# Patient Record
Sex: Female | Born: 1935 | Race: White | Hispanic: No | Smoking: Never smoker
Health system: Southern US, Community
[De-identification: ages and names within clinical notes are randomized; demographics above are authoritative.]

## PROBLEM LIST (undated history)

## (undated) DIAGNOSIS — M5430 Sciatica, unspecified side: Secondary | ICD-10-CM

## (undated) DIAGNOSIS — F0390 Unspecified dementia without behavioral disturbance: Secondary | ICD-10-CM

## (undated) DIAGNOSIS — M1990 Unspecified osteoarthritis, unspecified site: Secondary | ICD-10-CM

## (undated) DIAGNOSIS — I10 Essential (primary) hypertension: Secondary | ICD-10-CM

## (undated) DIAGNOSIS — I2510 Atherosclerotic heart disease of native coronary artery without angina pectoris: Secondary | ICD-10-CM

## (undated) DIAGNOSIS — G8929 Other chronic pain: Secondary | ICD-10-CM

## (undated) DIAGNOSIS — L309 Dermatitis, unspecified: Secondary | ICD-10-CM

## (undated) DIAGNOSIS — E785 Hyperlipidemia, unspecified: Secondary | ICD-10-CM

## (undated) DIAGNOSIS — M549 Dorsalgia, unspecified: Secondary | ICD-10-CM

## (undated) DIAGNOSIS — H269 Unspecified cataract: Secondary | ICD-10-CM

## (undated) HISTORY — PX: LIPOMA EXCISION: SHX5283

## (undated) HISTORY — DX: Essential (primary) hypertension: I10

## (undated) HISTORY — PX: ABDOMINAL HYSTERECTOMY: SHX81

## (undated) HISTORY — DX: Unspecified osteoarthritis, unspecified site: M19.90

## (undated) HISTORY — DX: Hyperlipidemia, unspecified: E78.5

## (undated) HISTORY — PX: FRACTURE SURGERY: SHX138

## (undated) HISTORY — DX: Atherosclerotic heart disease of native coronary artery without angina pectoris: I25.10

## (undated) HISTORY — DX: Dermatitis, unspecified: L30.9

---

## 2006-02-09 ENCOUNTER — Inpatient Hospital Stay (HOSPITAL_COMMUNITY): Admission: EM | Admit: 2006-02-09 | Discharge: 2006-02-09 | Payer: Self-pay | Admitting: Emergency Medicine

## 2006-03-17 ENCOUNTER — Ambulatory Visit: Payer: Self-pay | Admitting: Cardiology

## 2006-03-19 ENCOUNTER — Ambulatory Visit (HOSPITAL_BASED_OUTPATIENT_CLINIC_OR_DEPARTMENT_OTHER): Admission: RE | Admit: 2006-03-19 | Discharge: 2006-03-19 | Payer: Self-pay | Admitting: Orthopedic Surgery

## 2006-04-16 ENCOUNTER — Ambulatory Visit: Payer: Self-pay | Admitting: Cardiology

## 2009-08-21 LAB — HM MAMMOGRAPHY: HM Mammogram: NORMAL

## 2009-12-04 ENCOUNTER — Ambulatory Visit (HOSPITAL_COMMUNITY)
Admission: RE | Admit: 2009-12-04 | Discharge: 2009-12-04 | Payer: Self-pay | Source: Home / Self Care | Admitting: Surgery

## 2009-12-04 ENCOUNTER — Encounter: Admission: RE | Admit: 2009-12-04 | Discharge: 2009-12-04 | Payer: Self-pay | Admitting: Surgery

## 2009-12-26 ENCOUNTER — Ambulatory Visit (HOSPITAL_COMMUNITY)
Admission: RE | Admit: 2009-12-26 | Discharge: 2009-12-26 | Payer: Self-pay | Source: Home / Self Care | Admitting: Surgery

## 2010-04-02 LAB — BASIC METABOLIC PANEL
CO2: 27 mEq/L (ref 19–32)
GFR calc non Af Amer: >60 mL/min (ref >60)
Glucose, Bld: 109 mg/dL — ABNORMAL HIGH (ref 70–99)
Potassium: 4.2 mEq/L (ref 3.5–5.1)
Sodium: 141 mEq/L (ref 135–145)

## 2010-04-02 LAB — CBC
HCT: 41.6 % (ref 36.0–46.0)
Hemoglobin: 13.6 g/dL (ref 12.0–15.0)
MCH: 30.1 pg (ref 26.0–34.0)
MCHC: 32.7 g/dL (ref 30.0–36.0)

## 2010-04-02 LAB — SURGICAL PCR SCREEN: MRSA, PCR: NEGATIVE

## 2010-04-03 LAB — BASIC METABOLIC PANEL
BUN: 19 mg/dL (ref 6–23)
Chloride: 105 mEq/L (ref 96–112)
Potassium: 4.2 mEq/L (ref 3.5–5.1)
Sodium: 139 mEq/L (ref 135–145)

## 2010-05-17 ENCOUNTER — Other Ambulatory Visit: Payer: Self-pay | Admitting: Internal Medicine

## 2010-05-17 ENCOUNTER — Encounter: Payer: Self-pay | Admitting: Internal Medicine

## 2010-05-17 ENCOUNTER — Other Ambulatory Visit (INDEPENDENT_AMBULATORY_CARE_PROVIDER_SITE_OTHER): Payer: Medicare Other

## 2010-05-17 ENCOUNTER — Ambulatory Visit (INDEPENDENT_AMBULATORY_CARE_PROVIDER_SITE_OTHER): Payer: Medicare Other | Admitting: Internal Medicine

## 2010-05-17 VITALS — BP 142/80 | HR 71 | Temp 97.4°F | Resp 16 | Ht 63.0 in | Wt 188.0 lb

## 2010-05-17 DIAGNOSIS — L259 Unspecified contact dermatitis, unspecified cause: Secondary | ICD-10-CM

## 2010-05-17 DIAGNOSIS — R05 Cough: Secondary | ICD-10-CM | POA: Insufficient documentation

## 2010-05-17 DIAGNOSIS — E782 Mixed hyperlipidemia: Secondary | ICD-10-CM

## 2010-05-17 DIAGNOSIS — M839 Adult osteomalacia, unspecified: Secondary | ICD-10-CM

## 2010-05-17 DIAGNOSIS — Z23 Encounter for immunization: Secondary | ICD-10-CM

## 2010-05-17 DIAGNOSIS — IMO0001 Reserved for inherently not codable concepts without codable children: Secondary | ICD-10-CM | POA: Insufficient documentation

## 2010-05-17 DIAGNOSIS — L309 Dermatitis, unspecified: Secondary | ICD-10-CM | POA: Insufficient documentation

## 2010-05-17 DIAGNOSIS — I10 Essential (primary) hypertension: Secondary | ICD-10-CM

## 2010-05-17 DIAGNOSIS — E785 Hyperlipidemia, unspecified: Secondary | ICD-10-CM | POA: Insufficient documentation

## 2010-05-17 LAB — CBC WITH DIFFERENTIAL/PLATELET
Basophils Absolute: 0 10*3/uL (ref 0.0–0.1)
Eosinophils Absolute: 0.1 10*3/uL (ref 0.0–0.7)
Eosinophils Relative: 1 % (ref 0.0–5.0)
MCHC: 33.9 g/dL (ref 30.0–36.0)
MCV: 90.7 fl (ref 78.0–100.0)
Monocytes Absolute: 0.6 10*3/uL (ref 0.1–1.0)
Neutrophils Relative %: 67.2 % (ref 43.0–77.0)
Platelets: 208 10*3/uL (ref 150.0–400.0)
RDW: 13.7 % (ref 11.5–14.6)
WBC: 8.5 10*3/uL (ref 4.5–10.5)

## 2010-05-17 LAB — COMPREHENSIVE METABOLIC PANEL
AST: 22 U/L (ref 0–37)
Albumin: 3.7 g/dL (ref 3.5–5.2)
Alkaline Phosphatase: 69 U/L (ref 39–117)
GFR: 75 mL/min (ref >60.00)
Potassium: 3.9 mEq/L (ref 3.5–5.1)
Sodium: 142 mEq/L (ref 135–145)
Total Bilirubin: 0.6 mg/dL (ref 0.3–1.2)
Total Protein: 6.5 g/dL (ref 6.0–8.3)

## 2010-05-17 LAB — LIPID PANEL
Cholesterol: 245 mg/dL — ABNORMAL HIGH (ref 0–200)
HDL: 54.9 mg/dL (ref >39.00)
Triglycerides: 210 mg/dL — ABNORMAL HIGH (ref 0.0–149.0)

## 2010-05-17 LAB — LDL CHOLESTEROL, DIRECT: Direct LDL: 163.3 mg/dL

## 2010-05-17 LAB — TSH: TSH: 2.85 u[IU]/mL (ref 0.35–5.50)

## 2010-05-17 LAB — CK: Total CK: 113 U/L (ref 7–177)

## 2010-05-17 LAB — URINALYSIS, ROUTINE W REFLEX MICROSCOPIC
Hgb urine dipstick: NEGATIVE
Urine Glucose: NEGATIVE
Urobilinogen, UA: 0.2 (ref 0.0–1.0)

## 2010-05-17 MED ORDER — OLMESARTAN-AMLODIPINE-HCTZ 20-5-12.5 MG PO TABS: 1.0000 | ORAL_TABLET | Freq: Every day | ORAL | Status: DC

## 2010-05-17 MED ORDER — ROSUVASTATIN CALCIUM 5 MG PO TABS
5.0000 mg | ORAL_TABLET | Freq: Every day | ORAL | Status: DC
Start: 2010-05-17 — End: 2010-05-29

## 2010-05-17 NOTE — Patient Instructions (Signed)

## 2010-05-17 NOTE — Assessment & Plan Note (Signed)
Her skin is normal today, she wants to see a derm for f/up on eczema

## 2010-05-17 NOTE — Assessment & Plan Note (Signed)
This is caused by the ACEI so I will stop it, no testing is needed today

## 2010-05-17 NOTE — Assessment & Plan Note (Signed)
Stop simvastatin, change to crestor, check labs today (CPK, CBC, CMP)

## 2010-05-17 NOTE — Assessment & Plan Note (Signed)
Change to crestor, check TSH, FLP, and CMP today

## 2010-05-17 NOTE — Assessment & Plan Note (Signed)
Check her vitamin D level, she DOES NOT want to take a med for her "thin" bones

## 2010-05-17 NOTE — Progress Notes (Signed)
Subjective:    Patient ID: Virginia Edwards, female    DOB: 05-06-1925, 77 y.o.   MRN: [id number]  Cough This is a chronic problem. The current episode started more than 1 year ago. The problem has been unchanged. The problem occurs constantly. The cough is non-productive. Associated symptoms include myalgias. Pertinent negatives include no chest pain, chills, ear congestion, ear pain, eye redness, fever, headaches, heartburn, hemoptysis, nasal congestion, postnasal drip, rash, rhinorrhea, sore throat, shortness of breath, sweats, weight loss or wheezing. The symptoms are aggravated by other. She has tried OTC cough suppressant for the symptoms. The treatment provided no relief. There is no history of asthma, bronchiectasis, bronchitis, COPD, emphysema, environmental allergies or pneumonia.  Hypertension This is a chronic problem. The current episode started more than 1 year ago. The problem has been gradually improving since onset. The problem is controlled. Pertinent negatives include no anxiety, blurred vision, chest pain, headaches, malaise/fatigue, neck pain, orthopnea, palpitations, peripheral edema, PND, shortness of breath or sweats. There are no associated agents to hypertension. Past treatments include ACE inhibitors, calcium channel blockers and diuretics. The current treatment provides significant improvement. Compliance problems include exercise.  There is no history of chronic renal disease.  Hyperlipidemia This is a chronic problem. The current episode started more than 1 year ago. The problem is controlled. Recent lipid tests were reviewed and are variable. Exacerbating diseases include obesity. She has no history of chronic renal disease, diabetes, hypothyroidism, liver disease or nephrotic syndrome. Factors aggravating her hyperlipidemia include no known factors. Associated symptoms include myalgias. Pertinent negatives include no chest pain, focal sensory loss, focal weakness, leg pain or  shortness of breath. Current antihyperlipidemic treatment includes statins. The current treatment provides moderate improvement of lipids. There are no compliance problems.       Review of Systems  Constitutional: Negative for fever, chills, weight loss, malaise/fatigue, diaphoresis, activity change, appetite change, fatigue and unexpected weight change.  HENT: Negative for ear pain, nosebleeds, congestion, sore throat, facial swelling, rhinorrhea, sneezing, neck pain, neck stiffness, voice change and postnasal drip.   Eyes: Negative for blurred vision, pain, redness and itching.  Respiratory: Positive for cough. Negative for apnea, hemoptysis, choking, chest tightness, shortness of breath, wheezing and stridor.   Cardiovascular: Negative for chest pain, palpitations, orthopnea, leg swelling and PND.  Gastrointestinal: Negative for heartburn, nausea, vomiting, abdominal pain, diarrhea, constipation, blood in stool, abdominal distention and anal bleeding.  Genitourinary: Negative for dysuria, urgency, frequency, hematuria, flank pain, decreased urine volume, enuresis, difficulty urinating and dyspareunia.  Musculoskeletal: Positive for myalgias and arthralgias. Negative for back pain, joint swelling and gait problem.  Skin: Negative for color change, pallor and rash.  Neurological: Negative for dizziness, tremors, focal weakness, seizures, syncope, facial asymmetry, speech difficulty, weakness, light-headedness, numbness and headaches.  Hematological: Negative for environmental allergies and adenopathy.  Psychiatric/Behavioral: Negative for hallucinations, behavioral problems, confusion, sleep disturbance, decreased concentration and agitation. The patient is not nervous/anxious and is not hyperactive.        Objective:   Physical Exam  Vitals reviewed. Constitutional: She is oriented to person, place, and time. She appears well-developed and well-nourished. No distress.  HENT:  Head:  Normocephalic and atraumatic.  Right Ear: External ear normal.  Left Ear: External ear normal.  Nose: Nose normal.  Mouth/Throat: Oropharynx is clear and moist. No oropharyngeal exudate.  Eyes: Conjunctivae and EOM are normal. Pupils are equal, round, and reactive to light. Right eye exhibits no discharge. Left eye exhibits no discharge. No scleral icterus.  Neck: Normal range of motion. Neck supple. No JVD present. No tracheal deviation present. No thyromegaly present.  Cardiovascular: Normal rate, regular rhythm, normal heart sounds and intact distal pulses.  Exam reveals no gallop and no friction rub.   No murmur heard. Pulmonary/Chest: Effort normal and breath sounds normal. No stridor. No respiratory distress. She has no wheezes. She has no rales. She exhibits no tenderness.  Abdominal: Soft. Bowel sounds are normal. She exhibits no distension and no mass. There is no tenderness. There is no rebound and no guarding.  Musculoskeletal: Normal range of motion. She exhibits no edema and no tenderness.  Lymphadenopathy:    She has no cervical adenopathy.  Neurological: She is alert and oriented to person, place, and time. She has normal reflexes. No cranial nerve deficit.  Skin: Skin is warm and dry. No rash noted. She is not diaphoretic. No erythema. No pallor.  Psychiatric: She has a normal mood and affect. Her behavior is normal. Judgment and thought content normal.       Lab Results  Component Value Date   WBC 8.2 12/25/2009   HGB 13.6 12/25/2009   HCT 41.6 12/25/2009   PLT 206 12/25/2009   NA 141 12/25/2009   K 4.2 12/25/2009   CL 105 12/25/2009   CREATININE 0.77 12/25/2009   BUN 15 12/25/2009   CO2 27 12/25/2009      Assessment & Plan:

## 2010-05-17 NOTE — Assessment & Plan Note (Signed)
Stop the ACEI due to cough, consolidate her regimen into tribenzor

## 2010-05-20 LAB — VITAMIN D 1,25 DIHYDROXY: Vitamin D2 1, 25 (OH)2: 17 pg/mL

## 2010-05-28 ENCOUNTER — Telehealth: Payer: Self-pay | Admitting: *Deleted

## 2010-05-28 NOTE — Telephone Encounter (Signed)
Pt left vm req a call back.  

## 2010-05-28 NOTE — Telephone Encounter (Signed)
Spoke w/pt -   1. Has OV 06/15/10 - should she keep this?  2. New BP med combo caused her to feel very "bad" - wants to resume what she was on before. Ok for RFs of previous?  3. Crestor caused muscle pain worse in thigh and groin, wants to resume previous zocor, OK?

## 2010-05-29 MED ORDER — SIMVASTATIN 10 MG PO TABS: 10.0000 mg | ORAL_TABLET | Freq: Every day | ORAL | Status: DC

## 2010-05-29 MED ORDER — QUINAPRIL-HYDROCHLOROTHIAZIDE 20-25 MG PO TABS: 1.0000 | ORAL_TABLET | Freq: Every day | ORAL | Status: DC

## 2010-05-29 NOTE — Telephone Encounter (Signed)
1. Yes. Keep appt 2. Yes 3. Yes

## 2010-05-29 NOTE — Telephone Encounter (Signed)
MED list changed & RFs sent in, Need to call pt in the AM

## 2010-05-30 NOTE — Telephone Encounter (Signed)
Left detailed vm for pt.

## 2010-06-08 NOTE — Consult Note (Signed)
Virginia Edwards, Virginia Edwards                ACCOUNT NO.:  [id number]   MEDICAL RECORD NO.:  [id number]          PATIENT TYPE:  AMB   LOCATION:  DSC                          FACILITY:  [facility]   PHYSICIAN:  Madolyn Frieze. Jens Som, MD, FACCDATE OF BIRTH:  1925/02/23   DATE OF CONSULTATION:  03/19/2006  DATE OF DISCHARGE:                                 CONSULTATION   CARDIOLOGY CONSULT   HISTORY:  Virginia Edwards is a pleasant 77 year old female with a past  medical history of hypertension, hyperlipidemia; and now status post  left arthroscopic shoulder surgery who we are asked TO evaluate for  chest pain.  She has no prior cardiac history and denies any history of  dyspnea on exertion, orthopnea, PND, pedal edema, palpitations,  presyncope, syncope, or exertional chest pain.  She presented, today,  for her shoulder surgery.  She had local anesthesia to her left  shoulder.  She then developed chest pain that was described as a  pressure.  The pain did not radiate.  There was no associated nausea,  vomiting, shortness of breath, or diaphoresis.  The pain was not  pleuritic or positional.  It lasted for approximately 30 minutes; and  then she went to surgery.  Following surgery, her symptoms had resolved.  We are now asked to further evaluate.   MEDICATIONS INCLUDE:  1. Guansacine 2 mg p.o. daily  2. Quinaretic.  3  Amlodipine.  1. Zocor.  2. Multivitamin.  3. Tylenol   ALLERGIES:  She has an intolerance to ADVIL.   FAMILY HISTORY:  She did state that she had a brother who died at age  51.  He had a heart problem of unclear description.   PAST MEDICAL HISTORY:  Significant for hypertension and hyperlipidemia,  but there is no diabetes mellitus.  She has had a prior hysterectomy,  and now shoulder surgery; but there is no other surgeries noted.   SOCIAL HISTORY:  She does not smoke nor does she consume alcohol.   REVIEW OF SYSTEMS:  She denies any headaches, fevers, or chills.  There  is no  productive cough or hemoptysis.  There is no dysphagia,  odynphagia, melena, or hematochezia.  There is no dysuria or hematuria.  There is no seizure activity.  There is no orthopnea, PND, or pedal  edema.  The remaining systems are negative.   PHYSICAL EXAMINATION:  GENERAL:  Today, she is well-developed, well-  nourished in no acute distress at present.  SKIN:  Warm and dry.  VITAL SIGNS:  Her blood pressure is 150/80.  Her pulse is 96.  There is  no peripheral clubbing.  She is not appear to be depressed, although  mildly anxious.  HEENT:  Unremarkable normal eyelids.  NECK:  Supple with normal upstroke bilaterally.  I cannot appreciate  bruits.  There is no jugular distension and no thyromegaly is noted.  CHEST:  Clear to auscultation on expansion.  CARDIOVASCULAR: Reveals a regular rate and rhythm.  Normal S1-S2.  There  is a soft 1/6 systolic ejection murmur at the left sternal border.  There is no S3  or S4.  Her PMI is nondisplaced.  ABDOMINAL EXAM:  Nontender, positive bowel sounds.  No  hepatosplenomegaly.  No mass appreciated.  There is no abdominal bruit.  She has 2+ femoral pulses bilaterally.  No bruits.  EXTREMITIES:  Her left shoulder is in a sling following her arthroscopic  surgery.  She has 2+ radial pulses.  Show no edema and no cords.  She  has 1+ dorsalis pedis pulses bilaterally.  NEUROLOGIC EXAM:  Grossly intact.   ELECTROCARDIOGRAM:  Shows sinus rhythm at a rate of 96.  There is subtle  lateral T-wave inversion which is mildly increased compared to a  previous electrocardiogram dated 01/20.   DIAGNOSIS:  1. Atypical chest pain.  2. Hypertension.  3. Hyperlipidemia.  4. Status post arthroscopic shoulder surgery.   PLAN:  [name] developed substernal chest pain prior to her  procedure.  Her symptoms are somewhat atypical.  Her electrocardiogram  shows minor increased T-wave inversion in the lateral leads.  I have  recommended admission with rule out  myocardial infarction with serial  enzymes; however, she has declined.  I have discussed the risks of  undiagnosed coronary disease; including myocardial infarction and death.  She continues to decline and would prefer to be discharged home.  I  would continue with the present medications, but would add an aspirin  (enteric coated aspirin 81 mg p.o. daily).  I will have her call the  office and arrange an appointment see me and 1-2 weeks.  We will most  likely perform a Myoview in the future for risk stratification.      Madolyn Frieze Jens Som, MD, [facility]  Electronically Signed     BSC/MEDQ  D:  03/19/2006  T:  03/19/2006  Job:  [id number]

## 2010-06-08 NOTE — Op Note (Signed)
NAME:  Virginia Edwards, Virginia Edwards                ACCOUNT NO.:  [id number]   MEDICAL RECORD NO.:  [id number]          PATIENT TYPE:  AMB   LOCATION:  DSC                          FACILITY:  [facility]   PHYSICIAN:  Dyke Brackett, M.D.    DATE OF BIRTH:  1925/08/26   DATE OF PROCEDURE:  03/19/2006  DATE OF DISCHARGE:                               OPERATIVE REPORT   PREOPERATIVE DIAGNOSES:  1. Large complete tear, infraspinatus, supraspinatus, rotator cuff      tears.  2. Shearing-type injury with denuding of glenoid cartilage, anterior      inferior 20% of glenoid.  3. Degenerative tearing of anterior superior labrum.  4. Degenerative tearing of biceps tendon.   POSTOPERATIVE DIAGNOSES:  1. Large complete tear, infraspinatus, supraspinatus, rotator cuff      tears.  2. Shearing-type injury with denuding of glenoid cartilage, anterior      inferior 20% of glenoid.  3. Degenerative tearing of anterior superior labrum.  4. Degenerative tearing of biceps tendon.   OPERATION:  1. Open rotator cuff repair with acromioplasty.  2. Open distal clavicle excision.  3. Arthroscopic debridement of glenohumeral joint.   SURGEON:  Dyke Brackett, M.D.   ASSISTANT:  Legrand Pitts. Duffy, P.A.   INDICATIONS:  This 77 year old sustained a subglenoid dislocation that I  reduced under anesthesia.  Difficulty with continued use of the arm led  to an MRI, showing a large rotator cuff tear, thought to be amenable to  outpatient versus overnight hospitalization.   DESCRIPTION:  She was arthroscoped through a posterolateral and anterior  portal.  She did have some very early degenerative change on the humeral  head; however, with the dislocation, probably the anterior inferior 20%  of the glenoid cartilage was denuded with loose pieces that were  debrided.  Extensive tearing of the labrum, mainly degenerative in  nature, was debrided, as well as a degenerative biceps tendon, which was  probably not functioning well, but  intact, probably 50% involvement  with the tearing.  There was a large cuff tear that did appear to be  repairable though.  The procedure was converted to an open procedure  with incision in the deltoid, the acromial AC interval.  Very arthritic,  enlarged AC joint was resected, and a very hypertrophic, curved acromion  as well for an acromioplasty, revealing a very large cuff tear.  Bursa  was excised.  Cuff tear edges were resected.  It was a very large tear.  It was not retracted significantly, although the tissue was thin and  attenuated.  A bur was placed on the tuberosity to freshen it to a  bleeding edge, followed by placement of 3 Arthrex anchors, 5.5, each  with 2 sutures, for a total of 6 sutures on the repair of the cuff.  An  excellent repair, given the severity of the initial appearance.  The  closure was effected with interrupted Tycron on the deltoid, 0 and 2-0  Vicryl on the subcutaneous tissues, and the arthroscopy portals were  steri-stripped.  Lightly compressive sterile dressing and a shoulder  abduction pillow applied.  Taken to the recovery room in stable  condition.      Dyke Brackett, M.D.  Electronically Signed     WDC/MEDQ  D:  03/19/2006  T:  03/19/2006  Job:  [id number]

## 2010-06-08 NOTE — Op Note (Signed)
NAME:  Virginia Edwards, Virginia Edwards NO.:  [id number]   MEDICAL RECORD NO.:  [id number]          PATIENT TYPE:  INP   LOCATION:  0102                         [facility]ACILITY[facility]  [facility]   PHYSICIAN:  Dyke Brackett, M.D.    DATE OF BIRTH:  1925-06-10   DATE OF PROCEDURE:  02/09/2006  DATE OF DISCHARGE:  02/09/2006                               OPERATIVE REPORT   INDICATIONS:  This is a  77 year old female with a subglenoid anterior  dislocation of the shoulder, unable to reduce in the emergency room by  the Emergency Room physician, thought to be amenable to general  anesthetic, closed reduction.   PREOPERATIVE DIAGNOSIS:  Anterior subglenoid dislocation right shoulder.   POSTOPERATIVE DIAGNOSIS:  Anterior subglenoid dislocation right  shoulder.   OPERATION:  Closed reduction right shoulder dislocation.   SURGEON:  Caffrey.   ASSISTANT:  Leanne Chang.   General.   DESCRIPTION OF PROCEDURE:  After general anesthetic was administered, x-  ray confirmed the subglenoid dislocation with abduction and forward  flexion and external rotation the shoulder reduced without difficulty,  though initially it did seem to redislocate with minimal manipulation.  However, with the arm at the side rotation was possible internally and  externally without any tendency to redislocate.  C-arm fluoroscopy  confirmed a good reduction.  Pulses were intact pre and post reduction.  A sling, shoulder mobilizer applied.  Taken to the recovery room in  stable condition.      Dyke Brackett, M.D.  Electronically Signed     WDC/MEDQ  D:  02/09/2006  T:  02/09/2006  Job:  [id number]

## 2010-06-08 NOTE — Assessment & Plan Note (Signed)
[facility]                            CARDIOLOGY OFFICE NOTE   TRINITEY, ROACHE                         MRN:          [id number]  DATE:04/16/2006                            DOB:          February 13, 1925    SUBJECTIVE:  Virginia Edwards returns for followup today.  She is a 77-year-  old female with a past medical history of hypertension and  hyperlipidemia whom I recently evaluated following left arthroscopic  shoulder surgery.  After having local anesthesia to her left shoulder at  that time, she developed chest pain that was described as a pressure.  There was subtle lateral T-wave inversion.  We advised admission to rule  out a myocardial infarction, but she declined.  She otherwise has done  well following her surgery.  There is no dyspnea, chest pain,  palpitations or syncope.  She is continuing to rehab her right shoulder.   MEDICATIONS:  1. Guaifenesin/quinaretic 20/25 mg q.d.  2. Amlodipine 5 mg p.o. q.d.  3. Zocor 40 mg p.o. q.d.  4. Multivitamin.  5. Aspirin.   PHYSICAL EXAMINATION:  VITAL SIGNS:  Blood pressure 156/82, pulse 102,  weight 191 pounds.  NECK:  Supple with no bruits.  CHEST:  Clear.  CARDIOVASCULAR:  A regular rate and rhythm.  EXTREMITIES:  Shows a trace of edema.   Her electrocardiogram shows a sinus tachycardia at a rate of 102.  There  are no significant ST changes.  The lateral T-wave inversion that was  present on March 19, 2006, has improved.   DIAGNOSIS:  1. Recent atypical chest pain.  2. Status post arthroscopic shoulder surgery.  3. Hypertension.  4. Hyperlipidemia.   PLAN:  Ms. Spies has had no further chest pain and no shortness of  breath.  Her symptoms were atypical when we evaluated her  postoperatively, but she did have multiple risk factors.  I have offered  a Myoview for risk stratification today, but she has declined.  She  understands the risk of undiagnosed coronary artery disease.  I have  asked  her to follow up with Dr. Janey Greaser concerning her primary care  issues, including her lipids and hypertension.   She will see [place] back on an as-needed basis.     Virginia Frieze Jens Som, MD, [facility]  Electronically Signed    BSC/MedQ  DD: 04/16/2006  DT: 04/16/2006  Job #: [id number]   cc:   Al Decant. Janey Greaser, MD

## 2010-06-12 ENCOUNTER — Other Ambulatory Visit: Payer: Self-pay | Admitting: Internal Medicine

## 2010-06-15 ENCOUNTER — Ambulatory Visit: Payer: Medicare Other | Admitting: Internal Medicine

## 2010-07-05 ENCOUNTER — Other Ambulatory Visit: Payer: Self-pay | Admitting: Internal Medicine

## 2010-07-05 ENCOUNTER — Ambulatory Visit (INDEPENDENT_AMBULATORY_CARE_PROVIDER_SITE_OTHER): Payer: Medicare Other | Admitting: Internal Medicine

## 2010-07-05 ENCOUNTER — Encounter: Payer: Self-pay | Admitting: Internal Medicine

## 2010-07-05 ENCOUNTER — Other Ambulatory Visit (INDEPENDENT_AMBULATORY_CARE_PROVIDER_SITE_OTHER): Payer: Medicare Other

## 2010-07-05 VITALS — BP 140/80 | HR 70 | Temp 97.4°F | Resp 16 | Wt 187.0 lb

## 2010-07-05 DIAGNOSIS — I10 Essential (primary) hypertension: Secondary | ICD-10-CM

## 2010-07-05 DIAGNOSIS — M839 Adult osteomalacia, unspecified: Secondary | ICD-10-CM

## 2010-07-05 DIAGNOSIS — E782 Mixed hyperlipidemia: Secondary | ICD-10-CM

## 2010-07-05 DIAGNOSIS — M1710 Unilateral primary osteoarthritis, unspecified knee: Secondary | ICD-10-CM

## 2010-07-05 LAB — CBC WITH DIFFERENTIAL/PLATELET
Basophils Relative: 0.4 % (ref 0.0–3.0)
Eosinophils Relative: 0.8 % (ref 0.0–5.0)
HCT: 39.2 % (ref 36.0–46.0)
Hemoglobin: 13.3 g/dL (ref 12.0–15.0)
Lymphs Abs: 2.3 10*3/uL (ref 0.7–4.0)
MCV: 91 fl (ref 78.0–100.0)
Monocytes Absolute: 0.7 10*3/uL (ref 0.1–1.0)
Monocytes Relative: 6.7 % (ref 3.0–12.0)
Neutro Abs: 6.8 10*3/uL (ref 1.4–7.7)
Platelets: 191 10*3/uL (ref 150.0–400.0)
WBC: 9.8 10*3/uL (ref 4.5–10.5)

## 2010-07-05 LAB — COMPREHENSIVE METABOLIC PANEL
Alkaline Phosphatase: 68 U/L (ref 39–117)
BUN: 23 mg/dL (ref 6–23)
CO2: 29 mEq/L (ref 19–32)
Creatinine, Ser: 0.8 mg/dL (ref 0.4–1.2)
GFR: 72.82 mL/min (ref >60.00)
Glucose, Bld: 93 mg/dL (ref 70–99)
Sodium: 141 mEq/L (ref 135–145)
Total Bilirubin: 0.9 mg/dL (ref 0.3–1.2)
Total Protein: 6.8 g/dL (ref 6.0–8.3)

## 2010-07-05 LAB — LIPID PANEL
Cholesterol: 280 mg/dL — ABNORMAL HIGH (ref 0–200)
HDL: 57.9 mg/dL (ref >39.00)
Triglycerides: 214 mg/dL — ABNORMAL HIGH (ref 0.0–149.0)
VLDL: 42.8 mg/dL — ABNORMAL HIGH (ref 0.0–40.0)

## 2010-07-05 LAB — TSH: TSH: 3.37 u[IU]/mL (ref 0.35–5.50)

## 2010-07-05 MED ORDER — HYDROCHLOROTHIAZIDE 12.5 MG PO CAPS: 12.5000 mg | ORAL_CAPSULE | Freq: Every day | ORAL | Status: DC

## 2010-07-05 MED ORDER — GUANFACINE HCL 2 MG PO TABS: 2.0000 mg | ORAL_TABLET | Freq: Every day | ORAL | Status: DC

## 2010-07-05 MED ORDER — SIMVASTATIN 10 MG PO TABS: 10.0000 mg | ORAL_TABLET | Freq: Every day | ORAL | Status: DC

## 2010-07-05 NOTE — Patient Instructions (Addendum)
Hypertension (High Blood Pressure) As your heart beats, it forces blood through your arteries. This force is your blood pressure. If the pressure is too high, it is called hypertension (HTN) or high blood pressure. HTN is dangerous because you may have it and not know it. High blood pressure may mean that your heart has to work harder to pump blood. Your arteries may be narrow or stiff. The extra work puts you at risk for heart disease, stroke, and other problems.  Blood pressure consists of two numbers, a higher number over a lower, 110/72, for example. It is stated as "110 over 72." The ideal is below 120 for the top number (systolic) and under 80 for the bottom (diastolic). Write down your blood pressure today. You should pay close attention to your blood pressure if you have certain conditions such as:  Heart failure.  Prior heart attack.   Diabetes   Chronic kidney disease.   Prior stroke.   Multiple risk factors for heart disease.   To see if you have HTN, your blood pressure should be measured while you are seated with your arm held at the level of the heart. It should be measured at least twice. A one-time elevated blood pressure reading (especially in the Emergency Department) does not mean that you need treatment. There may be conditions in which the blood pressure is different between your right and left arms. It is important to see your caregiver soon for a recheck. Most people have essential hypertension which means that there is not a specific cause. This type of high blood pressure may be lowered by changing lifestyle factors such as:  Stress.  Smoking.   Lack of exercise.   Excessive weight.  Drug/tobacco/alcohol use.   Eating less salt.   Most people do not have symptoms from high blood pressure until it has caused damage to the body. Effective treatment can often prevent, delay or reduce that damage. TREATMENT Treatment for high blood pressure, when a cause has been  identified, is directed at the cause. There are a large number of medications to treat HTN. These fall into several categories, and your caregiver will help you select the medicines that are best for you. Medications may have side effects. You should review side effects with your caregiver. If your blood pressure stays high after you have made lifestyle changes or started on medicines,   Your medication(s) may need to be changed.   Other problems may need to be addressed.   Be certain you understand your prescriptions, and know how and when to take your medicine.   Be sure to follow up with your caregiver within the time frame advised (usually within two weeks) to have your blood pressure rechecked and to review your medications.   If you are taking more than one medicine to lower your blood pressure, make sure you know how and at what times they should be taken. Taking two medicines at the same time can result in blood pressure that is too low.  SEEK IMMEDIATE MEDICAL CARE IF YOU DEVELOP:  A severe headache, blurred or changing vision, or confusion.   Unusual weakness or numbness, or a faint feeling.   Severe chest or abdominal pain, vomiting, or breathing problems.  MAKE SURE YOU:   Understand these instructions.   Will watch your condition.   Will get help right away if you are not doing well or get worse.  Document Released: 01/07/2005 Document Re-Released: 06/27/2009 [employer] Patient Information 2011 [employer].Arthritis - Degenerative, Osteoarthritis You have osteoarthritis. This is the wear and tear arthritis that comes with aging. It is also called degenerative arthritis. This is common in people past middle age. It is caused by stress on the joints from living. The large weight bearing joints of the lower extremities are most often affected. The knees, hips, back, neck, and hands can become painful, swollen, and stiff. This is the most common type of arthritis. It comes on  with age, carrying too much weight, and from injury. Treatment includes resting the sore joint until the pain and swelling improve. Crutches or a walker may be needed for severe flares. Only take over-the-counter or prescription medicines for pain, discomfort, or fever as directed by your caregiver. Local heat therapy may improve motion. Cortisone shots into the joint are sometimes used to reduce pain and swelling during flares. Osteoarthritis is usually not crippling and progresses slowly. There are things you can do to decrease pain:  Avoid high impact activities.   Exercise regularly.   Low impact exercises such as walking, biking and swimming help to keep the muscles strong and keep normal joint function.   Stretching helps to keep your range of motion.   Lose weight if you are overweight. This reduces joint stress.  In severe cases when you have pain at rest or increasing disability, joint surgery may be helpful. See your caregiver for follow-up treatment as recommended.  SEEK IMMEDIATE MEDICAL CARE IF:  You have severe joint pain.   Marked swelling and redness in your joint develops.   You develop a high fever.  Document Released: 01/07/2005 Document Re-Released: 05/09/2007 [facility] Patient Information 2011 [place], [place].Hypertriglyceridemia  Diet for High blood levels of Triglycerides Most fats in food are triglycerides. Triglycerides in your blood are stored as fat in your body. High levels of triglycerides in your blood may put you at a greater risk for heart disease and stroke.  Normal triglyceride levels are less than 150 mg/dL. Borderline high levels are 150-199 mg/dl. High levels are 200 - 499 mg/dL, and very high triglyceride levels are greater than 500 mg/dL. The decision to treat high triglycerides is generally based on the level. For people with borderline or high triglyceride levels, treatment includes weight loss and exercise. Drugs are recommended for people with very  high triglyceride levels. Many people who need treatment for high triglyceride levels have metabolic syndrome. This syndrome is a collection of disorders that often include: insulin resistance, high blood pressure, blood clotting problems, high cholesterol and triglycerides. TESTING PROCEDURE FOR TRIGLYCERIDES  You should not eat 4 hours before getting your triglycerides measured. The normal range of triglycerides is between 10 and 250 milligrams per deciliter (mg/dl). Some people may have extreme levels (1000 or above), but your triglyceride level may be too high if it is above 150 mg/dl, depending on what other risk factors you have for heart disease.   People with high blood triglycerides may also have high blood cholesterol levels. If you have high blood cholesterol as well as high blood triglycerides, your risk for heart disease is probably greater than if you only had high triglycerides. High blood cholesterol is one of the main risk factors for heart disease.  CHANGING YOUR DIET  Your weight can affect your blood triglyceride level. If you are more than 20% above your ideal body weight, you may be able to lower your blood triglycerides by losing weight. Eating less and exercising regularly is the best way to combat this. Fat  provides more calories than any other food. The best way to lose weight is to eat less fat. Only 30% of your total calories should come from fat. Less than 7% of your diet should come from saturated fat. A diet low in fat and saturated fat is the same as a diet to decrease blood cholesterol. By eating a diet lower in fat, you may lose weight, lower your blood cholesterol, and lower your blood triglyceride level.  Eating a diet low in fat, especially saturated fat, may also help you lower your blood triglyceride level. Ask your dietitian to help you figure how much fat you can eat based on the number of calories your caregiver has prescribed for you.  Exercise, in addition to  helping with weight loss may also help lower triglyceride levels.   Alcohol can increase blood triglycerides. You may need to stop drinking alcoholic beverages.   Too much carbohydrate in your diet may also increase your blood triglycerides. Some complex carbohydrates are necessary in your diet. These may include bread, rice, potatoes, other starchy vegetables and cereals.   Reduce "simple" carbohydrates. These may include pure sugars, candy, honey, and jelly without losing other nutrients. If you have the kind of high blood triglycerides that is affected by the amount of carbohydrates in your diet, you will need to eat less sugar and less high-sugar foods. Your caregiver can help you with this.   Adding 2-4 grams of fish oil (EPA+ DHA) may also help lower triglycerides. Speak with your caregiver before adding any supplements to your regimen.  Following the Diet  Maintain your ideal weight. Your caregivers can help you with a diet. Generally, eating less food and getting more exercise will help you lose weight. Joining a weight control group may also help. Ask your caregivers for a good weight control group in your area.  Eat low-fat foods instead of high-fat foods. This can help you lose weight too.  These foods are lower in fat. Eat MORE of these:   Dried beans, peas, and lentils.   Egg whites.   Low-fat cottage cheese.   Fish.   Lean cuts of meat, such as round, sirloin, rump, and flank (cut extra fat off meat you fix).   Whole grain breads, cereals and pasta.   Skim and nonfat dry milk.   Low-fat yogurt.   Poultry without the skin.   Cheese made with skim or part-skim milk, such as mozzarella, parmesan, farmers', ricotta, or pot cheese.   These are higher fat foods. Eat LESS of these:   Whole milk and foods made from whole milk, such as American, blue, cheddar, monterey jack, and swiss cheese   High-fat meats, such as luncheon meats, sausages, knockwurst, bratwurst, hot dogs,  ribs, corned beef, ground pork, and regular ground beef.   Fried foods.  Limit saturated fats in your diet. Substituting unsaturated fat for saturated fat may decrease your blood triglyceride level. You will need to read package labels to know which products contain saturated fats.  These foods are high in saturated fat. Eat LESS of these:   Fried pork skins.  Whole milk.   Skin and fat from poultry.   Palm oil.   Butter.   Shortening.   Cream cheese.   Tomasa Blase.   Margarines and baked goods made from listed oils.   Vegetable shortenings.   Chitterlings.  Fat from meats.   Coconut oil.   Palm kernel oil.   Lard.   Cream.   Sour  cream.   Fatback.   Coffee whiteners and non-dairy creamers made with these oils.   Cheese made from whole milk.   Use unsaturated fats (both polyunsaturated and monounsaturated) moderately. Remember, even though unsaturated fats are better than saturated fats; you still want a diet low in total fat.  These foods are high in unsaturated fat:   Canola oil.  Sunflower oil.   Mayonnaise.   Almonds.   Peanuts.   Pine nuts.   Margarines made with these oils.   Safflower oil.  Olive oil.   Avocados.   Cashews.   Peanut butter.   Sunflower seeds.   Soybean oil.  Peanut oil.   Olives.   Pecans.   Walnuts.   Pumpkin seeds.   Avoid sugar and other high-sugar foods. This will decrease carbohydrates without decreasing other nutrients. Sugar in your food goes rapidly to your blood. When there is excess sugar in your blood, your liver may use it to make more triglycerides. Sugar also contains calories without other important nutrients.  Eat LESS of these:   Sugar, brown sugar, powdered sugar, jam, jelly, preserves, honey, syrup, molasses, pies, candy, cakes, cookies, frosting, pastries, colas, soft drinks, punches, fruit drinks, and regular gelatin.   Avoid alcohol. Alcohol, even more than sugar, may increase blood  triglycerides. In addition, alcohol is high in calories and low in nutrients. Ask for sparkling water, or a diet soft drink instead of an alcoholic beverage.  Suggestions for planning and preparing meals   Bake, broil, grill or roast meats instead of frying.   Remove fat from meats and skin from poultry before cooking.   Add spices, herbs, lemon juice or vinegar to vegetables instead of salt, rich sauces or gravies.   Use a non-stick skillet without fat or use no-stick sprays.   Cool and refrigerate stews and broth. Then remove the hardened fat floating on the surface before serving.   Refrigerate meat drippings and skim off fat to make low-fat gravies.   Serve more fish.   Use less butter, margarine and other high-fat spreads on bread or vegetables.   Use skim or reconstituted non-fat dry milk for cooking.   Cook with low-fat cheeses.   Substitute low-fat yogurt or cottage cheese for all or part of the sour cream in recipes for sauces, dips or congealed salads.   Use half yogurt/half mayonnaise in salad recipes.   Substitute evaporated skim milk for cream. Evaporated skim milk or reconstituted non-fat dry milk can be whipped and substituted for whipped cream in certain recipes.   Choose fresh fruits for dessert instead of high-fat foods such as pies or cakes. Fruits are naturally low in fat.  When Dining Out   Order low-fat appetizers such as fruit or vegetable juice, pasta with vegetables or tomato sauce.   Select clear, rather than cream soups.   Ask that dressings and gravies be served on the side. Then use less of them.   Order foods that are baked, broiled, poached, steamed, stir-fried, or roasted.   Ask for margarine instead of butter, and use only a small amount.   Drink sparkling water, unsweetened tea or coffee, or diet soft drinks instead of alcohol or other sweet beverages.  QUESTIONS AND ANSWERS ABOUT OTHER FATS IN THE BLOOD:  SATURATED FAT, TRANS FAT, AND  CHOLESTEROL What is trans fat? Trans fat is a type of fat that is formed when vegetable oil is hardened through a process called hydrogenation. This process helps makes foods more  solid, gives them shape, and prolongs their shelf life. Trans fats are also called hydrogenated or partially hydrogenated oils.  What do saturated fat, trans fat, and cholesterol in foods have to do with heart disease? Saturated fat, trans fat, and cholesterol in the diet all raise the level of LDL "bad" cholesterol in the blood. The higher the LDL cholesterol, the greater the risk for coronary heart disease (CHD). Saturated fat and trans fat raise LDL similarly.  What foods contain saturated fat, trans fat, and cholesterol? High amounts of saturated fat are found in animal products, such as fatty cuts of meat, chicken skin, and full-fat dairy products like butter, whole milk, cream, and cheese, and in tropical vegetable oils such as palm, palm kernel, and coconut oil. Trans fat is found in some of the same foods as saturated fat, such as vegetable shortening, some margarines (especially hard or stick margarine), crackers, cookies, baked goods, fried foods, salad dressings, and other processed foods made with partially hydrogenated vegetable oils. Small amounts of trans fat also occur naturally in some animal products, such as milk products, beef, and lamb. Foods high in cholesterol include liver, other organ meats, egg yolks, shrimp, and full-fat dairy products. How can I use the new food label to make heart-healthy food choices? Check the Nutrition Facts panel of the food label. Choose foods lower in saturated fat, trans fat, and cholesterol. For saturated fat and cholesterol, you can also use the Percent Daily Value (%DV): 5% DV or less is low, and 20% DV or more is high. (There is no %DV for trans fat.) Use the Nutrition Facts panel to choose foods low in saturated fat and cholesterol, and if the trans fat is not listed, read  the ingredients and limit products that list shortening or hydrogenated or partially hydrogenated vegetable oil, which tend to be high in trans fat. POINTS TO REMEMBER: YOU NEED A LITTLE TLC (THERAPEUTIC LIFESTYLE CHANGES)  Discuss your risk for heart disease with your caregivers, and take steps to reduce risk factors.   Change your diet. Choose foods that are low in saturated fat, trans fat, and cholesterol.   Add exercise to your daily routine if it is not already being done. Participate in physical activity of moderate intensity, like brisk walking, for at least 30 minutes on most, and preferably all days of the week. No time? Break the 30 minutes into three, 10-minute segments during the day.   Stop smoking. If you do smoke, contact your caregiver to discuss ways in which they can help you quit.   Do not use street drugs.   Maintain a normal weight.   Maintain a healthy blood pressure.   Keep up with your blood work for checking the fats in your blood as directed by your caregiver.  Document Released: 10/26/2003 Document Re-Released: 06/27/2009 [facility] Patient Information 2011 [place], [place].

## 2010-07-05 NOTE — Assessment & Plan Note (Signed)
I have asked her to stop the ACEI due to the cough and will continue the other three meds for now, today I will check her lytes and renal function

## 2010-07-05 NOTE — Assessment & Plan Note (Signed)
She is doing well on Simvastatin, today I will check her FLP, CMP, and TSH

## 2010-07-05 NOTE — Assessment & Plan Note (Signed)
Check her Vit D level today 

## 2010-07-05 NOTE — Assessment & Plan Note (Signed)
Continue Euflexxa and f/up with Dr. Madelon Lips

## 2010-07-05 NOTE — Progress Notes (Signed)
Subjective:    Patient ID: Virginia Edwards, female    DOB: Jul 15, 1925, 77 y.o.   MRN: [id number]  Hypertension This is a chronic problem. The current episode started more than 1 year ago. The problem has been gradually improving since onset. The problem is controlled. Pertinent negatives include no anxiety, blurred vision, chest pain, headaches, malaise/fatigue, neck pain, orthopnea, palpitations, peripheral edema, PND, shortness of breath or sweats. There are no associated agents to hypertension. Past treatments include calcium channel blockers, ACE inhibitors, diuretics and central alpha agonists. The current treatment provides moderate improvement. Compliance problems include medication side effects.  There is no history of chronic renal disease.  Hyperlipidemia This is a chronic problem. The current episode started more than 1 year ago. The problem is controlled. Recent lipid tests were reviewed and are variable. Exacerbating diseases include obesity. She has no history of chronic renal disease, diabetes, hypothyroidism, liver disease or nephrotic syndrome. Factors aggravating her hyperlipidemia include no known factors. Pertinent negatives include no chest pain, focal sensory loss, focal weakness, leg pain, myalgias or shortness of breath. Current antihyperlipidemic treatment includes statins. The current treatment provides moderate improvement of lipids. Compliance problems include adherence to exercise and adherence to diet.       Review of Systems  Constitutional: Negative for fever, chills, malaise/fatigue, diaphoresis, activity change, appetite change, fatigue and unexpected weight change.  HENT: Negative for nosebleeds, congestion, sore throat, facial swelling, rhinorrhea, sneezing, drooling, mouth sores, trouble swallowing, neck pain, dental problem, voice change, postnasal drip, sinus pressure and tinnitus.   Eyes: Negative.  Negative for blurred vision.  Respiratory: Positive for cough  (chronic, persistent, intermittent non-productive cough due to the ACEI). Negative for apnea, choking, chest tightness, shortness of breath, wheezing and stridor.   Cardiovascular: Negative for chest pain, palpitations, orthopnea, leg swelling and PND.  Gastrointestinal: Negative for nausea, vomiting, abdominal pain, diarrhea, constipation and blood in stool.  Genitourinary: Negative.   Musculoskeletal: Positive for arthralgias (pain in both knees, she sees Dr. Christian Mate and is getting Euflexxa inj in both knees). Negative for myalgias, back pain, joint swelling and gait problem.  Skin: Negative for color change, pallor and rash.  Neurological: Negative for dizziness, tremors, focal weakness, seizures, syncope, facial asymmetry, speech difficulty, weakness, light-headedness, numbness and headaches.  Hematological: Negative for adenopathy. Does not bruise/bleed easily.  Psychiatric/Behavioral: Negative.        Objective:   Physical Exam  Vitals reviewed. Constitutional: She is oriented to person, place, and time. She appears well-developed and well-nourished. No distress.  HENT:  Head: Normocephalic and atraumatic.  Right Ear: External ear normal.  Left Ear: External ear normal.  Nose: Nose normal.  Mouth/Throat: Oropharynx is clear and moist. No oropharyngeal exudate.  Eyes: Conjunctivae and EOM are normal. Pupils are equal, round, and reactive to light. Right eye exhibits no discharge. Left eye exhibits no discharge. No scleral icterus.  Neck: Normal range of motion. Neck supple. No JVD present. No tracheal deviation present. No thyromegaly present.  Cardiovascular: Normal rate, regular rhythm, normal heart sounds and intact distal pulses.  Exam reveals no gallop and no friction rub.   No murmur heard. Pulmonary/Chest: Effort normal and breath sounds normal. No stridor. No respiratory distress. She has no wheezes. She has no rales. She exhibits no tenderness.  Abdominal: Soft. Bowel sounds  are normal. She exhibits no distension and no mass. There is no tenderness. There is no rebound and no guarding.  Musculoskeletal: Normal range of motion. She exhibits no edema and no  tenderness.  Lymphadenopathy:    She has no cervical adenopathy.  Neurological: She is alert and oriented to person, place, and time. She has normal reflexes. She displays normal reflexes. No cranial nerve deficit. She exhibits normal muscle tone. Coordination normal.  Skin: Skin is warm and dry. No rash noted. She is not diaphoretic. No erythema. No pallor.  Psychiatric: She has a normal mood and affect. Her behavior is normal. Judgment and thought content normal.        Lab Results  Component Value Date   WBC 8.5 05/17/2010   HGB 13.4 05/17/2010   HCT 39.7 05/17/2010   PLT 208.0 05/17/2010   CHOL 245* 05/17/2010   TRIG 210.0* 05/17/2010   HDL 54.90 05/17/2010   LDLDIRECT 163.3 05/17/2010   ALT 17 05/17/2010   AST 22 05/17/2010   NA 142 05/17/2010   K 3.9 05/17/2010   CL 105 05/17/2010   CREATININE 0.8 05/17/2010   BUN 21 05/17/2010   CO2 28 05/17/2010   TSH 2.85 05/17/2010    Assessment & Plan:

## 2010-07-07 LAB — VITAMIN D 1,25 DIHYDROXY: Vitamin D3 1, 25 (OH)2: 24 pg/mL

## 2010-07-30 ENCOUNTER — Telehealth: Payer: Self-pay | Admitting: *Deleted

## 2010-07-30 NOTE — Telephone Encounter (Signed)
Cholesterol was high but otherwise labs look great

## 2010-07-30 NOTE — Telephone Encounter (Signed)
Patient informed, mailed copy of labs to patient.

## 2010-07-30 NOTE — Telephone Encounter (Signed)
Patient requesting results of last labs.

## 2010-08-16 ENCOUNTER — Ambulatory Visit (INDEPENDENT_AMBULATORY_CARE_PROVIDER_SITE_OTHER): Payer: Medicare Other | Admitting: Internal Medicine

## 2010-08-16 ENCOUNTER — Encounter: Payer: Self-pay | Admitting: Internal Medicine

## 2010-08-16 VITALS — BP 136/82 | HR 71 | Temp 98.2°F | Resp 16 | Wt 186.0 lb

## 2010-08-16 DIAGNOSIS — M839 Adult osteomalacia, unspecified: Secondary | ICD-10-CM

## 2010-08-16 DIAGNOSIS — I10 Essential (primary) hypertension: Secondary | ICD-10-CM

## 2010-08-16 DIAGNOSIS — E782 Mixed hyperlipidemia: Secondary | ICD-10-CM

## 2010-08-16 DIAGNOSIS — Z1231 Encounter for screening mammogram for malignant neoplasm of breast: Secondary | ICD-10-CM

## 2010-08-16 MED ORDER — NIACIN ER (ANTIHYPERLIPIDEMIC) 500 MG PO TBCR: 500.0000 mg | EXTENDED_RELEASE_TABLET | Freq: Every day | ORAL | Status: DC

## 2010-08-16 MED ORDER — PITAVASTATIN CALCIUM 2 MG PO TABS: 1.0000 | ORAL_TABLET | Freq: Every day | ORAL | Status: DC

## 2010-08-16 NOTE — Progress Notes (Signed)
Addended by: Etta Grandchild on: 08/16/2010 01:09 PM   Modules accepted: Orders

## 2010-08-16 NOTE — Patient Instructions (Signed)
Hypercholesterolemia High Blood Cholesterol Cholesterol is a white, waxy, fat-like protein needed by your body in small amounts. The liver makes all the cholesterol you need. It is carried from the liver by the blood through the blood vessels. Deposits (plaque) may build up on blood vessel walls. This makes the arteries narrower and stiffer. Plaque increases the risk for heart attack and stroke. You cannot feel your cholesterol level even if it is very high. The only way to know is by a blood test to check your lipid (fats) levels. Once you know your cholesterol levels, you should keep a record of the test results. Work with your caregiver to to keep your levels in the desired range. WHAT THE RESULTS MEAN:  Total cholesterol is a rough measure of all the cholesterol in your blood.   LDL is the so-called bad cholesterol. This is the type that deposits cholesterol in the walls of the arteries. You want this level to be low.   HDL is the good cholesterol because it cleans the arteries and carries the LDL away. You want this level to be high.   Triglycerides are fat that the body can either burn for energy or store. High levels are closely linked to heart disease.  DESIRED LEVELS:  Total cholesterol below 200.   LDL below 100 for people at risk, below 70 for very high risk.   HDL above 50 is good, above 60 is best.   Triglycerides below 150.  HOW TO LOWER YOUR CHOLESTEROL:  Diet.   Choose fish or white meat chicken and [place], roasted or baked. Limit fatty cuts of red meat, fried foods, and processed meats, such as sausage and lunch meat.   Eat lots of fresh fruits and vegetables. Choose whole grains, beans, pasta, potatoes and cereals.   Use only small amounts of olive, corn or canola oils. Avoid butter, mayonnaise, shortening or palm kernel oils. Avoid foods with trans-fats.   Use skim/nonfat milk and low-fat/nonfat yogurt and cheeses. Avoid whole milk, cream, ice cream, egg yolks and  cheeses. Healthy desserts include angel food cake, gingersnaps, animal crackers, hard candy, popsicles, and low-fat/nonfat frozen yogurt. Avoid pastries, cakes, pies and cookies.   Exercise.   A regular program helps decrease LDL and raises HDL.   Helps with weight control.   Do things that increase your activity level like gardening, walking, or taking the stairs.   Medication.   May be prescribed by your caregiver to help lowering cholesterol and the risk for heart disease.   You may need medicine even if your levels are normal if you have several risk factors.  HOME CARE INSTRUCTIONS  Follow your diet and exercise programs as suggested by your caregiver.   Take medications as directed.   Have blood work done when your caregiver feels it is necessary.  MAKE SURE YOU:   Understand these instructions.   Will watch your condition.   Will get help right away if you are not doing well or get worse.  Document Released: 01/07/2005 Document Re-Released: 12/21/2007 [facility] Patient Information 2011 [place], [place].Hypertriglyceridemia  Diet for High blood levels of Triglycerides Most fats in food are triglycerides. Triglycerides in your blood are stored as fat in your body. High levels of triglycerides in your blood may put you at a greater risk for heart disease and stroke.  Normal triglyceride levels are less than 150 mg/dL. Borderline high levels are 150-199 mg/dl. High levels are 200 - 499 mg/dL, and very high triglyceride levels are  greater than 500 mg/dL. The decision to treat high triglycerides is generally based on the level. For people with borderline or high triglyceride levels, treatment includes weight loss and exercise. Drugs are recommended for people with very high triglyceride levels. Many people who need treatment for high triglyceride levels have metabolic syndrome. This syndrome is a collection of disorders that often include: insulin resistance, high blood pressure,  blood clotting problems, high cholesterol and triglycerides. TESTING PROCEDURE FOR TRIGLYCERIDES  You should not eat 4 hours before getting your triglycerides measured. The normal range of triglycerides is between 10 and 250 milligrams per deciliter (mg/dl). Some people may have extreme levels (1000 or above), but your triglyceride level may be too high if it is above 150 mg/dl, depending on what other risk factors you have for heart disease.   People with high blood triglycerides may also have high blood cholesterol levels. If you have high blood cholesterol as well as high blood triglycerides, your risk for heart disease is probably greater than if you only had high triglycerides. High blood cholesterol is one of the main risk factors for heart disease.  CHANGING YOUR DIET  Your weight can affect your blood triglyceride level. If you are more than 20% above your ideal body weight, you may be able to lower your blood triglycerides by losing weight. Eating less and exercising regularly is the best way to combat this. Fat provides more calories than any other food. The best way to lose weight is to eat less fat. Only 30% of your total calories should come from fat. Less than 7% of your diet should come from saturated fat. A diet low in fat and saturated fat is the same as a diet to decrease blood cholesterol. By eating a diet lower in fat, you may lose weight, lower your blood cholesterol, and lower your blood triglyceride level.  Eating a diet low in fat, especially saturated fat, may also help you lower your blood triglyceride level. Ask your dietitian to help you figure how much fat you can eat based on the number of calories your caregiver has prescribed for you.  Exercise, in addition to helping with weight loss may also help lower triglyceride levels.   Alcohol can increase blood triglycerides. You may need to stop drinking alcoholic beverages.   Too much carbohydrate in your diet may also increase  your blood triglycerides. Some complex carbohydrates are necessary in your diet. These may include bread, rice, potatoes, other starchy vegetables and cereals.   Reduce "simple" carbohydrates. These may include pure sugars, candy, honey, and jelly without losing other nutrients. If you have the kind of high blood triglycerides that is affected by the amount of carbohydrates in your diet, you will need to eat less sugar and less high-sugar foods. Your caregiver can help you with this.   Adding 2-4 grams of fish oil (EPA+ DHA) may also help lower triglycerides. Speak with your caregiver before adding any supplements to your regimen.  Following the Diet  Maintain your ideal weight. Your caregivers can help you with a diet. Generally, eating less food and getting more exercise will help you lose weight. Joining a weight control group may also help. Ask your caregivers for a good weight control group in your area.  Eat low-fat foods instead of high-fat foods. This can help you lose weight too.  These foods are lower in fat. Eat MORE of these:   Dried beans, peas, and lentils.   Egg whites.   Low-fat  cottage cheese.   Fish.   Lean cuts of meat, such as round, sirloin, rump, and flank (cut extra fat off meat you fix).   Whole grain breads, cereals and pasta.   Skim and nonfat dry milk.   Low-fat yogurt.   Poultry without the skin.   Cheese made with skim or part-skim milk, such as mozzarella, parmesan, farmers', ricotta, or pot cheese.   These are higher fat foods. Eat LESS of these:   Whole milk and foods made from whole milk, such as American, blue, cheddar, monterey jack, and swiss cheese   High-fat meats, such as luncheon meats, sausages, knockwurst, bratwurst, hot dogs, ribs, corned beef, ground pork, and regular ground beef.   Fried foods.  Limit saturated fats in your diet. Substituting unsaturated fat for saturated fat may decrease your blood triglyceride level. You will need to  read package labels to know which products contain saturated fats.  These foods are high in saturated fat. Eat LESS of these:   Fried pork skins.  Whole milk.   Skin and fat from poultry.   Palm oil.   Butter.   Shortening.   Cream cheese.   Tomasa Blase.   Margarines and baked goods made from listed oils.   Vegetable shortenings.   Chitterlings.  Fat from meats.   Coconut oil.   Palm kernel oil.   Lard.   Cream.   Sour cream.   Fatback.   Coffee whiteners and non-dairy creamers made with these oils.   Cheese made from whole milk.   Use unsaturated fats (both polyunsaturated and monounsaturated) moderately. Remember, even though unsaturated fats are better than saturated fats; you still want a diet low in total fat.  These foods are high in unsaturated fat:   Canola oil.  Sunflower oil.   Mayonnaise.   Almonds.   Peanuts.   Pine nuts.   Margarines made with these oils.   Safflower oil.  Olive oil.   Avocados.   Cashews.   Peanut butter.   Sunflower seeds.   Soybean oil.  Peanut oil.   Olives.   Pecans.   Walnuts.   Pumpkin seeds.   Avoid sugar and other high-sugar foods. This will decrease carbohydrates without decreasing other nutrients. Sugar in your food goes rapidly to your blood. When there is excess sugar in your blood, your liver may use it to make more triglycerides. Sugar also contains calories without other important nutrients.  Eat LESS of these:   Sugar, brown sugar, powdered sugar, jam, jelly, preserves, honey, syrup, molasses, pies, candy, cakes, cookies, frosting, pastries, colas, soft drinks, punches, fruit drinks, and regular gelatin.   Avoid alcohol. Alcohol, even more than sugar, may increase blood triglycerides. In addition, alcohol is high in calories and low in nutrients. Ask for sparkling water, or a diet soft drink instead of an alcoholic beverage.  Suggestions for planning and preparing meals   Bake, broil, grill  or roast meats instead of frying.   Remove fat from meats and skin from poultry before cooking.   Add spices, herbs, lemon juice or vinegar to vegetables instead of salt, rich sauces or gravies.   Use a non-stick skillet without fat or use no-stick sprays.   Cool and refrigerate stews and broth. Then remove the hardened fat floating on the surface before serving.   Refrigerate meat drippings and skim off fat to make low-fat gravies.   Serve more fish.   Use less butter, margarine and other high-fat spreads on bread  or vegetables.   Use skim or reconstituted non-fat dry milk for cooking.   Cook with low-fat cheeses.   Substitute low-fat yogurt or cottage cheese for all or part of the sour cream in recipes for sauces, dips or congealed salads.   Use half yogurt/half mayonnaise in salad recipes.   Substitute evaporated skim milk for cream. Evaporated skim milk or reconstituted non-fat dry milk can be whipped and substituted for whipped cream in certain recipes.   Choose fresh fruits for dessert instead of high-fat foods such as pies or cakes. Fruits are naturally low in fat.  When Dining Out   Order low-fat appetizers such as fruit or vegetable juice, pasta with vegetables or tomato sauce.   Select clear, rather than cream soups.   Ask that dressings and gravies be served on the side. Then use less of them.   Order foods that are baked, broiled, poached, steamed, stir-fried, or roasted.   Ask for margarine instead of butter, and use only a small amount.   Drink sparkling water, unsweetened tea or coffee, or diet soft drinks instead of alcohol or other sweet beverages.  QUESTIONS AND ANSWERS ABOUT OTHER FATS IN THE BLOOD:  SATURATED FAT, TRANS FAT, AND CHOLESTEROL What is trans fat? Trans fat is a type of fat that is formed when vegetable oil is hardened through a process called hydrogenation. This process helps makes foods more solid, gives them shape, and prolongs their shelf  life. Trans fats are also called hydrogenated or partially hydrogenated oils.  What do saturated fat, trans fat, and cholesterol in foods have to do with heart disease? Saturated fat, trans fat, and cholesterol in the diet all raise the level of LDL "bad" cholesterol in the blood. The higher the LDL cholesterol, the greater the risk for coronary heart disease (CHD). Saturated fat and trans fat raise LDL similarly.  What foods contain saturated fat, trans fat, and cholesterol? High amounts of saturated fat are found in animal products, such as fatty cuts of meat, chicken skin, and full-fat dairy products like butter, whole milk, cream, and cheese, and in tropical vegetable oils such as palm, palm kernel, and coconut oil. Trans fat is found in some of the same foods as saturated fat, such as vegetable shortening, some margarines (especially hard or stick margarine), crackers, cookies, baked goods, fried foods, salad dressings, and other processed foods made with partially hydrogenated vegetable oils. Small amounts of trans fat also occur naturally in some animal products, such as milk products, beef, and lamb. Foods high in cholesterol include liver, other organ meats, egg yolks, shrimp, and full-fat dairy products. How can I use the new food label to make heart-healthy food choices? Check the Nutrition Facts panel of the food label. Choose foods lower in saturated fat, trans fat, and cholesterol. For saturated fat and cholesterol, you can also use the Percent Daily Value (%DV): 5% DV or less is low, and 20% DV or more is high. (There is no %DV for trans fat.) Use the Nutrition Facts panel to choose foods low in saturated fat and cholesterol, and if the trans fat is not listed, read the ingredients and limit products that list shortening or hydrogenated or partially hydrogenated vegetable oil, which tend to be high in trans fat. POINTS TO REMEMBER: YOU NEED A LITTLE TLC (THERAPEUTIC LIFESTYLE  CHANGES)  Discuss your risk for heart disease with your caregivers, and take steps to reduce risk factors.   Change your diet. Choose foods that are low  in saturated fat, trans fat, and cholesterol.   Add exercise to your daily routine if it is not already being done. Participate in physical activity of moderate intensity, like brisk walking, for at least 30 minutes on most, and preferably all days of the week. No time? Break the 30 minutes into three, 10-minute segments during the day.   Stop smoking. If you do smoke, contact your caregiver to discuss ways in which they can help you quit.   Do not use street drugs.   Maintain a normal weight.   Maintain a healthy blood pressure.   Keep up with your blood work for checking the fats in your blood as directed by your caregiver.  Document Released: 10/26/2003 Document Re-Released: 06/27/2009 [facility] Patient Information 2011 [place], [place].

## 2010-08-16 NOTE — Assessment & Plan Note (Signed)
Her BP is well controlled 

## 2010-08-16 NOTE — Assessment & Plan Note (Signed)
Start livalo and niaspan

## 2010-08-16 NOTE — Progress Notes (Signed)
Subjective:    Patient ID: Virginia Edwards, female    DOB: Dec 18, 1925, 77 y.o.   MRN: [id number]  Hyperlipidemia This is a chronic problem. The current episode started more than 1 year ago. The problem is uncontrolled. Recent lipid tests were reviewed and are high. Exacerbating diseases include obesity. She has no history of chronic renal disease, diabetes, hypothyroidism, liver disease or nephrotic syndrome. Factors aggravating her hyperlipidemia include no known factors. Pertinent negatives include no chest pain, focal sensory loss, focal weakness, leg pain, myalgias or shortness of breath. Current antihyperlipidemic treatment includes statins. The current treatment provides no improvement of lipids. Compliance problems include adherence to exercise and adherence to diet.       Review of Systems  Constitutional: Negative for fever, chills, diaphoresis, activity change, appetite change, fatigue and unexpected weight change.  HENT: Negative.   Eyes: Negative.   Respiratory: Negative for apnea, cough, choking, chest tightness, shortness of breath, wheezing and stridor.   Cardiovascular: Negative for chest pain, palpitations and leg swelling.  Gastrointestinal: Negative for nausea, vomiting, abdominal pain, diarrhea, constipation and blood in stool.  Genitourinary: Negative for dysuria, urgency, frequency, hematuria, flank pain, decreased urine volume, enuresis, difficulty urinating and dyspareunia.  Musculoskeletal: Positive for arthralgias (bilateral knee pain). Negative for myalgias, back pain, joint swelling and gait problem.  Skin: Negative for color change, pallor, rash and wound.  Neurological: Negative for dizziness, tremors, focal weakness, seizures, syncope, facial asymmetry, speech difficulty, weakness, light-headedness, numbness and headaches.  Hematological: Negative for adenopathy. Does not bruise/bleed easily.  Psychiatric/Behavioral: Negative.        Objective:   Physical Exam    Vitals reviewed. Constitutional: She is oriented to person, place, and time. She appears well-developed and well-nourished. No distress.  HENT:  Head: Normocephalic and atraumatic.  Right Ear: External ear normal.  Left Ear: External ear normal.  Nose: Nose normal.  Mouth/Throat: Oropharynx is clear and moist. No oropharyngeal exudate.  Eyes: Conjunctivae and EOM are normal. Pupils are equal, round, and reactive to light. Right eye exhibits no discharge. Left eye exhibits no discharge. No scleral icterus.  Neck: Normal range of motion. Neck supple. No JVD present. No tracheal deviation present. No thyromegaly present.  Cardiovascular: Normal rate, regular rhythm, normal heart sounds and intact distal pulses.  Exam reveals no gallop and no friction rub.   No murmur heard. Pulmonary/Chest: Effort normal and breath sounds normal. No stridor. No respiratory distress. She has no wheezes. She has no rales. She exhibits no tenderness.  Abdominal: Soft. Bowel sounds are normal. She exhibits no distension and no mass. There is no tenderness. There is no rebound and no guarding.  Musculoskeletal: Normal range of motion. She exhibits no edema and no tenderness.  Lymphadenopathy:    She has no cervical adenopathy.  Neurological: She is alert and oriented to person, place, and time. She has normal reflexes. She displays normal reflexes. No cranial nerve deficit. She exhibits normal muscle tone. Coordination normal.  Skin: Skin is warm and dry. No rash noted. She is not diaphoretic. No erythema. No pallor.  Psychiatric: She has a normal mood and affect. Her behavior is normal. Judgment and thought content normal.       Lab Results  Component Value Date   WBC 9.8 07/05/2010   HGB 13.3 07/05/2010   HCT 39.2 07/05/2010   PLT 191.0 07/05/2010   CHOL 280* 07/05/2010   TRIG 214.0* 07/05/2010   HDL 57.90 07/05/2010   LDLDIRECT 201.5 07/05/2010   ALT 19  07/05/2010   AST 22 07/05/2010   NA 141 07/05/2010   K 4.0  07/05/2010   CL 105 07/05/2010   CREATININE 0.8 07/05/2010   BUN 23 07/05/2010   CO2 29 07/05/2010   TSH 3.37 07/05/2010     Assessment & Plan:

## 2010-08-16 NOTE — Assessment & Plan Note (Signed)
Her recent vitamin D levels look great

## 2010-08-21 ENCOUNTER — Telehealth: Payer: Self-pay | Admitting: *Deleted

## 2010-08-21 NOTE — Telephone Encounter (Signed)
Yes she can? You want patient to take niaspan? Will symptoms decrease after time?

## 2010-08-21 NOTE — Telephone Encounter (Signed)
Yes she can.

## 2010-08-21 NOTE — Telephone Encounter (Signed)
Pt left vm - wants MD to know she can not take niaspan. C/o weakness & felt flushed when taking. No problems with Livalo.

## 2010-08-22 ENCOUNTER — Telehealth: Payer: Self-pay | Admitting: *Deleted

## 2010-08-22 DIAGNOSIS — E782 Mixed hyperlipidemia: Secondary | ICD-10-CM

## 2010-08-22 MED ORDER — ATORVASTATIN CALCIUM 40 MG PO TABS: 40.0000 mg | ORAL_TABLET | Freq: Every day | ORAL | Status: DC

## 2010-08-22 NOTE — Telephone Encounter (Signed)
Yes she can stop niacin, yes the symptoms will abate

## 2010-08-22 NOTE — Telephone Encounter (Signed)
done

## 2010-08-22 NOTE — Telephone Encounter (Signed)
Pt c/o muscle aches - she is stopping livalo and would like RX for lipitor. She has had lipitor in the past w/no problems.

## 2010-08-23 NOTE — Telephone Encounter (Signed)
Left vm for pt to check w/pharm 

## 2010-09-21 ENCOUNTER — Ambulatory Visit: Payer: Medicare Other | Admitting: Internal Medicine

## 2010-09-24 ENCOUNTER — Other Ambulatory Visit: Payer: Self-pay | Admitting: Internal Medicine

## 2010-09-27 ENCOUNTER — Ambulatory Visit: Payer: Medicare Other | Admitting: Internal Medicine

## 2010-10-04 ENCOUNTER — Ambulatory Visit: Payer: Medicare Other

## 2010-10-16 ENCOUNTER — Encounter: Payer: Self-pay | Admitting: Internal Medicine

## 2010-11-23 ENCOUNTER — Encounter: Payer: Self-pay | Admitting: Internal Medicine

## 2010-11-23 ENCOUNTER — Ambulatory Visit (INDEPENDENT_AMBULATORY_CARE_PROVIDER_SITE_OTHER): Payer: Medicare Other | Admitting: Internal Medicine

## 2010-11-23 ENCOUNTER — Other Ambulatory Visit (INDEPENDENT_AMBULATORY_CARE_PROVIDER_SITE_OTHER): Payer: Medicare Other

## 2010-11-23 VITALS — BP 118/66 | HR 74 | Temp 98.6°F | Resp 16 | Wt 183.5 lb

## 2010-11-23 DIAGNOSIS — E782 Mixed hyperlipidemia: Secondary | ICD-10-CM

## 2010-11-23 DIAGNOSIS — I10 Essential (primary) hypertension: Secondary | ICD-10-CM

## 2010-11-23 DIAGNOSIS — M1710 Unilateral primary osteoarthritis, unspecified knee: Secondary | ICD-10-CM

## 2010-11-23 DIAGNOSIS — Z23 Encounter for immunization: Secondary | ICD-10-CM

## 2010-11-23 LAB — COMPREHENSIVE METABOLIC PANEL
Albumin: 3.8 g/dL (ref 3.5–5.2)
CO2: 26 mEq/L (ref 19–32)
Calcium: 9.4 mg/dL (ref 8.4–10.5)
Chloride: 104 mEq/L (ref 96–112)
GFR: 63.5 mL/min (ref >60.00)
Glucose, Bld: 105 mg/dL — ABNORMAL HIGH (ref 70–99)
Sodium: 138 mEq/L (ref 135–145)
Total Bilirubin: 1 mg/dL (ref 0.3–1.2)
Total Protein: 7 g/dL (ref 6.0–8.3)

## 2010-11-23 LAB — TSH: TSH: 2.13 u[IU]/mL (ref 0.35–5.50)

## 2010-11-23 LAB — LIPID PANEL
Cholesterol: 181 mg/dL (ref 0–200)
HDL: 58.3 mg/dL (ref >39.00)
Triglycerides: 98 mg/dL (ref 0.0–149.0)
VLDL: 19.6 mg/dL (ref 0.0–40.0)

## 2010-11-23 MED ORDER — DICLOFENAC SODIUM 1 % TD GEL: 1.0000 "application " | Freq: Four times a day (QID) | TRANSDERMAL | Status: DC

## 2010-11-23 NOTE — Patient Instructions (Signed)

## 2010-11-23 NOTE — Assessment & Plan Note (Signed)
Her BP is well controlled, I will check her lytes and renal function today 

## 2010-11-23 NOTE — Assessment & Plan Note (Signed)
She will try voltaren gel

## 2010-11-23 NOTE — Progress Notes (Signed)
Subjective:    Patient ID: Virginia Edwards, female    DOB: [birth year], 77 y.o.   MRN: [id number]  Hypertension This is a chronic problem. The current episode started more than 1 year ago. The problem has been gradually improving since onset. The problem is controlled. Pertinent negatives include no anxiety, blurred vision, chest pain, headaches, malaise/fatigue, neck pain, orthopnea, palpitations, peripheral edema, PND, shortness of breath or sweats. There are no associated agents to hypertension. Past treatments include diuretics, central alpha agonists and ACE inhibitors. The current treatment provides moderate improvement. Compliance problems include exercise and diet.       Review of Systems  Constitutional: Negative for fever, chills, malaise/fatigue, diaphoresis, activity change, appetite change, fatigue and unexpected weight change.  HENT: Negative for sore throat, facial swelling, trouble swallowing, neck pain and voice change.   Eyes: Negative.  Negative for blurred vision.  Respiratory: Negative for apnea, cough, choking, chest tightness, shortness of breath, wheezing and stridor.   Cardiovascular: Negative for chest pain, palpitations, orthopnea and PND.  Gastrointestinal: Negative for nausea, vomiting, diarrhea, constipation and abdominal distention.  Genitourinary: Negative for dysuria, urgency, frequency, hematuria, flank pain, decreased urine volume, enuresis, difficulty urinating and dyspareunia.  Musculoskeletal: Positive for arthralgias. Negative for myalgias, back pain, joint swelling and gait problem.  Skin: Negative for color change, pallor, rash and wound.  Neurological: Negative for dizziness, tremors, seizures, syncope, facial asymmetry, speech difficulty, weakness, light-headedness, numbness and headaches.  Hematological: Negative for adenopathy. Does not bruise/bleed easily.  Psychiatric/Behavioral: Negative.        Objective:   Physical Exam  Vitals  reviewed. Constitutional: She is oriented to person, place, and time. She appears well-developed and well-nourished. No distress.  HENT:  Head: Normocephalic and atraumatic.  Mouth/Throat: Oropharynx is clear and moist. No oropharyngeal exudate.  Eyes: Conjunctivae are normal. Right eye exhibits no discharge. Left eye exhibits no discharge. No scleral icterus.  Neck: Normal range of motion. Neck supple. No JVD present. No tracheal deviation present. No thyromegaly present.  Cardiovascular: Normal rate, regular rhythm, normal heart sounds and intact distal pulses.  Exam reveals no gallop and no friction rub.   No murmur heard. Pulmonary/Chest: Effort normal and breath sounds normal. No stridor. No respiratory distress. She has no wheezes. She has no rales. She exhibits no tenderness.  Abdominal: Soft. Bowel sounds are normal. She exhibits no distension and no mass. There is no tenderness. There is no rebound and no guarding.  Musculoskeletal: She exhibits no edema and no tenderness.       Right knee: She exhibits decreased range of motion and deformity (severe DJD changes). She exhibits no swelling, no effusion, no ecchymosis and normal alignment. no tenderness found.       Left knee: She exhibits decreased range of motion and deformity (moderate DJD changes). She exhibits no swelling, no effusion and no ecchymosis. no tenderness found.  Lymphadenopathy:    She has no cervical adenopathy.  Neurological: She is oriented to person, place, and time. She displays normal reflexes. She exhibits normal muscle tone.  Skin: Skin is warm and dry. No rash noted. She is not diaphoretic. No erythema. No pallor.  Psychiatric: She has a normal mood and affect. Her behavior is normal. Judgment and thought content normal.      Lab Results  Component Value Date   WBC 9.8 07/05/2010   HGB 13.3 07/05/2010   HCT 39.2 07/05/2010   PLT 191.0 07/05/2010   GLUCOSE 93 07/05/2010   CHOL 280* 07/05/2010  TRIG 214.0*  07/05/2010   HDL 57.90 07/05/2010   LDLDIRECT 201.5 07/05/2010   ALT 19 07/05/2010   AST 22 07/05/2010   NA 141 07/05/2010   K 4.0 07/05/2010   CL 105 07/05/2010   CREATININE 0.8 07/05/2010   BUN 23 07/05/2010   CO2 29 07/05/2010   TSH 3.37 07/05/2010      Assessment & Plan:

## 2010-11-23 NOTE — Assessment & Plan Note (Signed)
I will check her FLP to see if she has good control of her trigs and LDL

## 2010-12-04 ENCOUNTER — Telehealth: Payer: Self-pay | Admitting: *Deleted

## 2010-12-04 DIAGNOSIS — M1710 Unilateral primary osteoarthritis, unspecified knee: Secondary | ICD-10-CM

## 2010-12-04 NOTE — Telephone Encounter (Signed)
Pt would like to D/C Voltaren Gel and begin taking RX Naproxen [she states as discussed with you].

## 2010-12-05 MED ORDER — NAPROXEN 375 MG PO TABS: 375.0000 mg | ORAL_TABLET | Freq: Two times a day (BID) | ORAL | Status: DC

## 2010-12-05 NOTE — Telephone Encounter (Signed)
done

## 2010-12-05 NOTE — Telephone Encounter (Signed)
LMOM to inform patient that request has been completed.

## 2010-12-11 ENCOUNTER — Telehealth: Payer: Self-pay | Admitting: Internal Medicine

## 2010-12-11 NOTE — Telephone Encounter (Signed)
Patient called inquiring about her lab results. Please advise.

## 2010-12-12 NOTE — Telephone Encounter (Signed)
Potassium level was slightly low and blood sugar was slightly high

## 2010-12-12 NOTE — Telephone Encounter (Signed)
Informed patient of her lab results.

## 2010-12-26 ENCOUNTER — Other Ambulatory Visit: Payer: Self-pay | Admitting: Internal Medicine

## 2011-02-22 ENCOUNTER — Ambulatory Visit (INDEPENDENT_AMBULATORY_CARE_PROVIDER_SITE_OTHER): Payer: Medicare Other | Admitting: Internal Medicine

## 2011-02-22 ENCOUNTER — Other Ambulatory Visit (INDEPENDENT_AMBULATORY_CARE_PROVIDER_SITE_OTHER): Payer: Medicare Other

## 2011-02-22 ENCOUNTER — Encounter: Payer: Self-pay | Admitting: Internal Medicine

## 2011-02-22 DIAGNOSIS — E782 Mixed hyperlipidemia: Secondary | ICD-10-CM | POA: Diagnosis not present

## 2011-02-22 DIAGNOSIS — I10 Essential (primary) hypertension: Secondary | ICD-10-CM | POA: Diagnosis not present

## 2011-02-22 DIAGNOSIS — R7309 Other abnormal glucose: Secondary | ICD-10-CM

## 2011-02-22 DIAGNOSIS — M1710 Unilateral primary osteoarthritis, unspecified knee: Secondary | ICD-10-CM

## 2011-02-22 DIAGNOSIS — E876 Hypokalemia: Secondary | ICD-10-CM

## 2011-02-22 DIAGNOSIS — R739 Hyperglycemia, unspecified: Secondary | ICD-10-CM | POA: Insufficient documentation

## 2011-02-22 LAB — BASIC METABOLIC PANEL
Calcium: 9.7 mg/dL (ref 8.4–10.5)
Chloride: 103 mEq/L (ref 96–112)
Creatinine, Ser: 0.8 mg/dL (ref 0.4–1.2)
GFR: 75.98 mL/min (ref >60.00)

## 2011-02-22 LAB — MAGNESIUM: Magnesium: 2 mg/dL (ref 1.5–2.5)

## 2011-02-22 LAB — HEMOGLOBIN A1C: Hgb A1c MFr Bld: 6.2 % (ref 4.6–6.5)

## 2011-02-22 MED ORDER — OLMESARTAN-AMLODIPINE-HCTZ 40-5-12.5 MG PO TABS: 1.0000 | ORAL_TABLET | Freq: Every day | ORAL | Status: DC

## 2011-02-22 NOTE — Assessment & Plan Note (Signed)
Stop tenex at her request, change to tribenzor for better BP control

## 2011-02-22 NOTE — Patient Instructions (Signed)

## 2011-02-22 NOTE — Assessment & Plan Note (Signed)
Check K+ and Mg++ levels today and start K+ replacement if needed

## 2011-02-22 NOTE — Assessment & Plan Note (Signed)
Continue aleve as needed.  °

## 2011-02-22 NOTE — Progress Notes (Signed)
  Subjective:    Patient ID: Virginia Edwards, female    DOB: July 24, 1925, 78 y.o.   MRN: [id number]  Hypertension This is a chronic problem. The current episode started more than 1 year ago. The problem is unchanged. The problem is uncontrolled. Pertinent negatives include no anxiety, blurred vision, chest pain, headaches, malaise/fatigue, neck pain, orthopnea, palpitations, peripheral edema, PND, shortness of breath or sweats. There are no associated agents to hypertension. Past treatments include central alpha agonists, calcium channel blockers, ACE inhibitors and diuretics. The current treatment provides moderate improvement. Compliance problems include exercise and diet (she asks to stop tenex because her insurance will not cover it anymore).       Review of Systems  Constitutional: Positive for fatigue. Negative for fever, chills, malaise/fatigue, diaphoresis, activity change, appetite change and unexpected weight change.  HENT: Negative.  Negative for neck pain.   Eyes: Negative.  Negative for blurred vision.  Respiratory: Negative for cough, choking, chest tightness, shortness of breath, wheezing and stridor.   Cardiovascular: Negative for chest pain, palpitations, orthopnea, leg swelling and PND.  Gastrointestinal: Negative.   Genitourinary: Negative.   Musculoskeletal: Positive for arthralgias (knees). Negative for myalgias, back pain, joint swelling and gait problem.  Skin: Negative.   Neurological: Negative for dizziness, tremors, seizures, syncope, facial asymmetry, speech difficulty, weakness, light-headedness, numbness and headaches.  Hematological: Negative for adenopathy. Does not bruise/bleed easily.  Psychiatric/Behavioral: Negative.        Objective:   Physical Exam  Vitals reviewed. Constitutional: She is oriented to person, place, and time. She appears well-developed and well-nourished. No distress.  HENT:  Head: Normocephalic and atraumatic.  Mouth/Throat: Oropharynx is  clear and moist. No oropharyngeal exudate.  Eyes: Conjunctivae are normal. Right eye exhibits no discharge. Left eye exhibits no discharge. No scleral icterus.  Neck: Normal range of motion. Neck supple. No JVD present. No tracheal deviation present. No thyromegaly present.  Cardiovascular: Normal rate, regular rhythm, normal heart sounds and intact distal pulses.  Exam reveals no gallop and no friction rub.   No murmur heard. Pulmonary/Chest: Effort normal and breath sounds normal. No stridor. No respiratory distress. She has no wheezes. She has no rales. She exhibits no tenderness.  Abdominal: Soft. Bowel sounds are normal. She exhibits no distension. There is no tenderness. There is no rebound and no guarding.  Musculoskeletal: Normal range of motion. She exhibits edema (trace edema in both legs). She exhibits no tenderness.  Lymphadenopathy:    She has no cervical adenopathy.  Neurological: She is oriented to person, place, and time.  Skin: Skin is warm and dry. No rash noted. She is not diaphoretic. No erythema. No pallor.  Psychiatric: She has a normal mood and affect. Her behavior is normal. Judgment and thought content normal.      Lab Results  Component Value Date   WBC 9.8 07/05/2010   HGB 13.3 07/05/2010   HCT 39.2 07/05/2010   PLT 191.0 07/05/2010   GLUCOSE 105* 11/23/2010   CHOL 181 11/23/2010   TRIG 98.0 11/23/2010   HDL 58.30 11/23/2010   LDLDIRECT 201.5 07/05/2010   LDLCALC 103* 11/23/2010   ALT 18 11/23/2010   AST 22 11/23/2010   NA 138 11/23/2010   K 3.4* 11/23/2010   CL 104 11/23/2010   CREATININE 0.9 11/23/2010   BUN 18 11/23/2010   CO2 26 11/23/2010   TSH 2.13 11/23/2010      Assessment & Plan:

## 2011-02-22 NOTE — Assessment & Plan Note (Signed)
Check an a1c today to see if she has DM II

## 2011-02-22 NOTE — Assessment & Plan Note (Signed)
Continue current meds 

## 2011-02-24 ENCOUNTER — Encounter: Payer: Self-pay | Admitting: Internal Medicine

## 2011-03-05 ENCOUNTER — Telehealth: Payer: Self-pay | Admitting: *Deleted

## 2011-03-05 NOTE — Telephone Encounter (Signed)
Requesting to speak with nurse concerning labs that was done... 03/05/11@1 :41pm/LMB

## 2011-03-05 NOTE — Telephone Encounter (Signed)
Patient notified of lab results. Also advised that lipid panel was not collected in recent lab order due to normal 3 ms ago

## 2011-06-04 ENCOUNTER — Other Ambulatory Visit: Payer: Self-pay

## 2011-06-04 DIAGNOSIS — I10 Essential (primary) hypertension: Secondary | ICD-10-CM

## 2011-06-04 MED ORDER — OLMESARTAN-AMLODIPINE-HCTZ 40-5-12.5 MG PO TABS: 1.0000 | ORAL_TABLET | Freq: Every day | ORAL | Status: DC

## 2011-06-10 ENCOUNTER — Other Ambulatory Visit: Payer: Self-pay

## 2011-06-10 ENCOUNTER — Telehealth: Payer: Self-pay

## 2011-06-10 DIAGNOSIS — I10 Essential (primary) hypertension: Secondary | ICD-10-CM

## 2011-06-10 MED ORDER — OLMESARTAN-AMLODIPINE-HCTZ 40-5-12.5 MG PO TABS: 1.0000 | ORAL_TABLET | Freq: Every day | ORAL | Status: DC

## 2011-06-10 NOTE — Telephone Encounter (Signed)
Pt called stating that PA is needed on Tribenzor, verified same with pharmacy. 14 day supply sample in cabinet for pt pick up. PA request will be faxed to our office per pharmacy,

## 2011-06-11 NOTE — Telephone Encounter (Signed)
[employer] medicare at [phone], spoke with Corrie Dandy and provided clinical information. Medication approved and insurance will contact patient

## 2011-06-20 ENCOUNTER — Other Ambulatory Visit (INDEPENDENT_AMBULATORY_CARE_PROVIDER_SITE_OTHER): Payer: Medicare Other

## 2011-06-20 ENCOUNTER — Ambulatory Visit (INDEPENDENT_AMBULATORY_CARE_PROVIDER_SITE_OTHER): Payer: Medicare Other | Admitting: Internal Medicine

## 2011-06-20 ENCOUNTER — Encounter: Payer: Self-pay | Admitting: Internal Medicine

## 2011-06-20 VITALS — BP 130/70 | HR 76 | Temp 98.7°F | Resp 16 | Wt 185.0 lb

## 2011-06-20 DIAGNOSIS — E782 Mixed hyperlipidemia: Secondary | ICD-10-CM | POA: Diagnosis not present

## 2011-06-20 DIAGNOSIS — I10 Essential (primary) hypertension: Secondary | ICD-10-CM

## 2011-06-20 DIAGNOSIS — IMO0002 Reserved for concepts with insufficient information to code with codable children: Secondary | ICD-10-CM

## 2011-06-20 DIAGNOSIS — E876 Hypokalemia: Secondary | ICD-10-CM

## 2011-06-20 DIAGNOSIS — R7309 Other abnormal glucose: Secondary | ICD-10-CM

## 2011-06-20 DIAGNOSIS — M1710 Unilateral primary osteoarthritis, unspecified knee: Secondary | ICD-10-CM

## 2011-06-20 LAB — COMPREHENSIVE METABOLIC PANEL
ALT: 22 U/L (ref 0–35)
CO2: 28 mEq/L (ref 19–32)
Calcium: 9.7 mg/dL (ref 8.4–10.5)
Chloride: 109 mEq/L (ref 96–112)
Creatinine, Ser: 0.7 mg/dL (ref 0.4–1.2)
GFR: 83.37 mL/min (ref >60.00)
Total Protein: 6.9 g/dL (ref 6.0–8.3)

## 2011-06-20 LAB — LIPID PANEL
Cholesterol: 171 mg/dL (ref 0–200)
HDL: 51.5 mg/dL (ref >39.00)
VLDL: 28.6 mg/dL (ref 0.0–40.0)

## 2011-06-20 LAB — TSH: TSH: 2.87 u[IU]/mL (ref 0.35–5.50)

## 2011-06-20 LAB — CBC WITH DIFFERENTIAL/PLATELET
Basophils Absolute: 0 10*3/uL (ref 0.0–0.1)
Hemoglobin: 12.7 g/dL (ref 12.0–15.0)
Lymphocytes Relative: 20.3 % (ref 12.0–46.0)
Monocytes Relative: 5.9 % (ref 3.0–12.0)
Platelets: 216 10*3/uL (ref 150.0–400.0)
RDW: 13.7 % (ref 11.5–14.6)

## 2011-06-20 MED ORDER — FLAVOCOXID-CIT ZN BISGLCINATE 500-50 MG PO CAPS: 1.0000 | ORAL_CAPSULE | Freq: Two times a day (BID) | ORAL | Status: DC

## 2011-06-20 MED ORDER — OLMESARTAN-AMLODIPINE-HCTZ 40-5-12.5 MG PO TABS: 1.0000 | ORAL_TABLET | Freq: Every day | ORAL | Status: DC

## 2011-06-20 NOTE — Assessment & Plan Note (Signed)
Her bp is well controlled, i will check her lytes and renal function today

## 2011-06-20 NOTE — Patient Instructions (Signed)
Hypertension As your heart beats, it forces blood through your arteries. This force is your blood pressure. If the pressure is too high, it is called hypertension (HTN) or high blood pressure. HTN is dangerous because you may have it and not know it. High blood pressure may mean that your heart has to work harder to pump blood. Your arteries may be narrow or stiff. The extra work puts you at risk for heart disease, stroke, and other problems.  Blood pressure consists of two numbers, a higher number over a lower, 110/72, for example. It is stated as "110 over 72." The ideal is below 120 for the top number (systolic) and under 80 for the bottom (diastolic). Write down your blood pressure today. You should pay close attention to your blood pressure if you have certain conditions such as:  Heart failure.   Prior heart attack.   Diabetes   Chronic kidney disease.   Prior stroke.   Multiple risk factors for heart disease.  To see if you have HTN, your blood pressure should be measured while you are seated with your arm held at the level of the heart. It should be measured at least twice. A one-time elevated blood pressure reading (especially in the Emergency Department) does not mean that you need treatment. There may be conditions in which the blood pressure is different between your right and left arms. It is important to see your caregiver soon for a recheck. Most people have essential hypertension which means that there is not a specific cause. This type of high blood pressure may be lowered by changing lifestyle factors such as:  Stress.   Smoking.   Lack of exercise.   Excessive weight.   Drug/tobacco/alcohol use.   Eating less salt.  Most people do not have symptoms from high blood pressure until it has caused damage to the body. Effective treatment can often prevent, delay or reduce that damage. TREATMENT  When a cause has been identified, treatment for high blood pressure is  directed at the cause. There are a large number of medications to treat HTN. These fall into several categories, and your caregiver will help you select the medicines that are best for you. Medications may have side effects. You should review side effects with your caregiver. If your blood pressure stays high after you have made lifestyle changes or started on medicines,   Your medication(s) may need to be changed.   Other problems may need to be addressed.   Be certain you understand your prescriptions, and know how and when to take your medicine.   Be sure to follow up with your caregiver within the time frame advised (usually within two weeks) to have your blood pressure rechecked and to review your medications.   If you are taking more than one medicine to lower your blood pressure, make sure you know how and at what times they should be taken. Taking two medicines at the same time can result in blood pressure that is too low.  SEEK IMMEDIATE MEDICAL CARE IF:  You develop a severe headache, blurred or changing vision, or confusion.   You have unusual weakness or numbness, or a faint feeling.   You have severe chest or abdominal pain, vomiting, or breathing problems.  MAKE SURE YOU:   Understand these instructions.   Will watch your condition.   Will get help right away if you are not doing well or get worse.  Document Released: 01/07/2005 Document Revised: 12/27/2010 Document Reviewed:   08/28/2007 [employer] Patient Information 2012 [employer].Degenerative Arthritis You have osteoarthritis. This is the wear and tear arthritis that comes with aging. It is also called degenerative arthritis. This is common in people past middle age. It is caused by stress on the joints. The large weight bearing joints of the lower extremities are most often affected. The knees, hips, back, neck, and hands can become painful, swollen, and stiff. This is the most common type of arthritis. It comes on  with age, carrying too much weight, or from an injury. Treatment includes resting the sore joint until the pain and swelling improve. Crutches or a walker may be needed for severe flares. Only take over-the-counter or prescription medicines for pain, discomfort, or fever as directed by your caregiver. Local heat therapy may improve motion. Cortisone shots into the joint are sometimes used to reduce pain and swelling during flares. Osteoarthritis is usually not crippling and progresses slowly. There are things you can do to decrease pain:  Avoid high impact activities.   Exercise regularly.   Low impact exercises such as walking, biking and swimming help to keep the muscles strong and keep normal joint function.   Stretching helps to keep your range of motion.   Lose weight if you are overweight. This reduces joint stress.  In severe cases when you have pain at rest or increasing disability, joint surgery may be helpful. See your caregiver for follow-up treatment as recommended.  SEEK IMMEDIATE MEDICAL CARE IF:   You have severe joint pain.   Marked swelling and redness in your joint develops.   You develop a high fever.  Document Released: 01/07/2005 Document Revised: 12/27/2010 Document Reviewed: 06/09/2006 [employer] Patient Information 2012 [employer]. 

## 2011-06-20 NOTE — Assessment & Plan Note (Signed)
She will try limbrel for relief of the joint pain

## 2011-06-20 NOTE — Assessment & Plan Note (Signed)
She has stopped taking niacin, i will check her flp cmp cpk tsh today

## 2011-06-20 NOTE — Progress Notes (Signed)
Subjective:    Patient ID: Virginia Edwards, female    DOB: May 24, 1925, 78 y.o.   MRN: [id number]  Hypertension This is a chronic problem. The current episode started more than 1 year ago. The problem has been gradually improving since onset. The problem is controlled. Pertinent negatives include no anxiety, blurred vision, chest pain, headaches, malaise/fatigue, neck pain, orthopnea, palpitations, peripheral edema, PND, shortness of breath or sweats. Agents associated with hypertension include NSAIDs. Past treatments include diuretics, calcium channel blockers and angiotensin blockers. The current treatment provides moderate improvement. Compliance problems include exercise and diet.  There is no history of chronic renal disease.  Hyperlipidemia This is a chronic problem. The current episode started more than 1 year ago. The problem is controlled. Recent lipid tests were reviewed and are variable. Exacerbating diseases include obesity. She has no history of chronic renal disease, diabetes, hypothyroidism, liver disease or nephrotic syndrome. Factors aggravating her hyperlipidemia include thiazides. Pertinent negatives include no chest pain, focal sensory loss, focal weakness, leg pain, myalgias or shortness of breath. Current antihyperlipidemic treatment includes fibric acid derivatives and statins. The current treatment provides significant improvement of lipids. Compliance problems include psychosocial issues, adherence to exercise and adherence to diet.       Review of Systems  Constitutional: Negative for fever, chills, malaise/fatigue, diaphoresis, activity change, appetite change, fatigue and unexpected weight change.  HENT: Negative.  Negative for neck pain.   Eyes: Negative.  Negative for blurred vision.  Respiratory: Negative for apnea, cough, chest tightness, shortness of breath, wheezing and stridor.   Cardiovascular: Negative for chest pain, palpitations, orthopnea, leg swelling and PND.    Gastrointestinal: Negative for nausea, vomiting, abdominal pain, diarrhea, constipation and anal bleeding.  Genitourinary: Negative.   Musculoskeletal: Positive for arthralgias (knees). Negative for myalgias, back pain, joint swelling and gait problem.  Skin: Negative for color change, pallor, rash and wound.  Neurological: Negative for dizziness, tremors, focal weakness, seizures, syncope, facial asymmetry, speech difficulty, weakness, light-headedness, numbness and headaches.  Hematological: Negative for adenopathy. Does not bruise/bleed easily.  Psychiatric/Behavioral: Negative.        Objective:   Physical Exam  Vitals reviewed. Constitutional: She is oriented to person, place, and time. She appears well-developed and well-nourished. No distress.  HENT:  Head: Normocephalic and atraumatic.  Mouth/Throat: Oropharynx is clear and moist. No oropharyngeal exudate.  Eyes: Conjunctivae are normal. Right eye exhibits no discharge. Left eye exhibits no discharge. No scleral icterus.  Neck: Normal range of motion. Neck supple. No JVD present. No tracheal deviation present. No thyromegaly present.  Cardiovascular: Normal rate, regular rhythm, normal heart sounds and intact distal pulses.  Exam reveals no gallop and no friction rub.   No murmur heard. Pulmonary/Chest: Effort normal and breath sounds normal. No stridor. No respiratory distress. She has no wheezes. She has no rales. She exhibits no tenderness.  Abdominal: Soft. Bowel sounds are normal. She exhibits no distension and no mass. There is no tenderness. There is no rebound and no guarding.  Musculoskeletal: Normal range of motion. She exhibits no edema and no tenderness.  Lymphadenopathy:    She has no cervical adenopathy.  Neurological: She is oriented to person, place, and time.  Skin: Skin is warm and dry. No rash noted. She is not diaphoretic. No erythema. No pallor.  Psychiatric: She has a normal mood and affect. Her behavior is  normal. Judgment and thought content normal.      Lab Results  Component Value Date   WBC 9.8 07/05/2010  HGB 13.3 07/05/2010   HCT 39.2 07/05/2010   PLT 191.0 07/05/2010   GLUCOSE 103* 02/22/2011   CHOL 181 11/23/2010   TRIG 98.0 11/23/2010   HDL 58.30 11/23/2010   LDLDIRECT 201.5 07/05/2010   LDLCALC 103* 11/23/2010   ALT 18 11/23/2010   AST 22 11/23/2010   NA 140 02/22/2011   K 3.7 02/22/2011   CL 103 02/22/2011   CREATININE 0.8 02/22/2011   BUN 23 02/22/2011   CO2 29 02/22/2011   TSH 2.13 11/23/2010   HGBA1C 6.2 02/22/2011      Assessment & Plan:

## 2011-06-20 NOTE — Assessment & Plan Note (Signed)
I will recheck her K+ level today 

## 2011-06-20 NOTE — Assessment & Plan Note (Signed)
I will check her a1c level today to see if she has developed DM II

## 2011-07-08 ENCOUNTER — Telehealth: Payer: Self-pay

## 2011-07-08 NOTE — Telephone Encounter (Signed)
Received pharmacy rejection stating that insurance will not cover Limbrel 500-50 mg.  [employer], spoke with rep who advised that med not coved but will fax form to complete, however this does not guarantee an approval.

## 2011-07-11 NOTE — Telephone Encounter (Signed)
Form completed and faxed back to insurance company for response

## 2011-07-16 NOTE — Telephone Encounter (Signed)
no

## 2011-07-16 NOTE — Telephone Encounter (Signed)
Called [employer] and was advised that Limbrel is not covered due to being considered a supplement/ PA Denied. Do you have any other advisements Thanks

## 2011-08-08 ENCOUNTER — Other Ambulatory Visit (INDEPENDENT_AMBULATORY_CARE_PROVIDER_SITE_OTHER): Payer: Medicare Other

## 2011-08-08 ENCOUNTER — Ambulatory Visit (INDEPENDENT_AMBULATORY_CARE_PROVIDER_SITE_OTHER): Payer: Medicare Other | Admitting: Internal Medicine

## 2011-08-08 ENCOUNTER — Encounter: Payer: Self-pay | Admitting: Internal Medicine

## 2011-08-08 VITALS — BP 136/72 | HR 77 | Temp 98.2°F | Resp 16 | Wt 184.0 lb

## 2011-08-08 DIAGNOSIS — R7309 Other abnormal glucose: Secondary | ICD-10-CM

## 2011-08-08 DIAGNOSIS — E782 Mixed hyperlipidemia: Secondary | ICD-10-CM

## 2011-08-08 DIAGNOSIS — I10 Essential (primary) hypertension: Secondary | ICD-10-CM | POA: Diagnosis not present

## 2011-08-08 LAB — COMPREHENSIVE METABOLIC PANEL
Alkaline Phosphatase: 65 U/L (ref 39–117)
BUN: 25 mg/dL — ABNORMAL HIGH (ref 6–23)
Creatinine, Ser: 0.8 mg/dL (ref 0.4–1.2)
Glucose, Bld: 113 mg/dL — ABNORMAL HIGH (ref 70–99)
Total Bilirubin: 0.6 mg/dL (ref 0.3–1.2)

## 2011-08-08 LAB — LIPID PANEL
LDL Cholesterol: 91 mg/dL (ref 0–99)
Total CHOL/HDL Ratio: 3
VLDL: 32.8 mg/dL (ref 0.0–40.0)

## 2011-08-08 LAB — CK: Total CK: 163 U/L (ref 7–177)

## 2011-08-08 NOTE — Assessment & Plan Note (Signed)
She has stopped taking niacin and appears to be doing well on lipitor, her last CK level was very slightly elevated so I will recheck that today as well as her FLP and CMP

## 2011-08-08 NOTE — Progress Notes (Signed)
Subjective:    Patient ID: Virginia Edwards, female    DOB: Mar 27, 1925, 78 y.o.   MRN: [id number]  Hyperlipidemia This is a chronic problem. The current episode started more than 1 year ago. The problem is controlled. Recent lipid tests were reviewed and are variable. Exacerbating diseases include obesity. She has no history of chronic renal disease, diabetes, hypothyroidism, liver disease or nephrotic syndrome. Factors aggravating her hyperlipidemia include thiazides and fatty foods. Pertinent negatives include no chest pain, focal sensory loss, focal weakness, leg pain, myalgias or shortness of breath. Current antihyperlipidemic treatment includes statins. The current treatment provides moderate improvement of lipids. Compliance problems include adherence to exercise and adherence to diet.   Hypertension This is a chronic problem. The current episode started more than 1 year ago. The problem has been gradually improving since onset. The problem is controlled. Pertinent negatives include no anxiety, blurred vision, chest pain, headaches, malaise/fatigue, neck pain, orthopnea, palpitations, peripheral edema, PND, shortness of breath or sweats. Past treatments include angiotensin blockers, calcium channel blockers and diuretics. The current treatment provides significant improvement. Compliance problems include exercise and diet.  There is no history of chronic renal disease.      Review of Systems  Constitutional: Negative.  Negative for malaise/fatigue.  HENT: Negative.  Negative for neck pain.   Eyes: Negative.  Negative for blurred vision.  Respiratory: Negative for apnea, cough, chest tightness, shortness of breath, wheezing and stridor.   Cardiovascular: Negative for chest pain, palpitations, orthopnea, leg swelling and PND.  Gastrointestinal: Negative for nausea, vomiting, abdominal pain, diarrhea and constipation.  Genitourinary: Negative.   Musculoskeletal: Positive for arthralgias (bilateral  knee pain). Negative for myalgias, back pain, joint swelling and gait problem.  Skin: Negative for color change, pallor, rash and wound.  Neurological: Negative.  Negative for focal weakness and headaches.  Hematological: Negative for adenopathy. Does not bruise/bleed easily.  Psychiatric/Behavioral: Positive for confusion and decreased concentration. Negative for suicidal ideas, hallucinations, behavioral problems, disturbed wake/sleep cycle, self-injury, dysphoric mood and agitation. The patient is not nervous/anxious and is not hyperactive.        Objective:   Physical Exam  Vitals reviewed. Constitutional: She is oriented to person, place, and time. She appears well-developed and well-nourished. No distress.  HENT:  Head: Normocephalic and atraumatic.  Mouth/Throat: Oropharynx is clear and moist.  Eyes: Conjunctivae are normal. Right eye exhibits no discharge. Left eye exhibits no discharge. No scleral icterus.  Neck: Normal range of motion. Neck supple. No JVD present. No tracheal deviation present. No thyromegaly present.  Cardiovascular: Normal rate, regular rhythm and intact distal pulses.  Exam reveals no gallop and no friction rub.   Murmur heard. Pulmonary/Chest: Effort normal and breath sounds normal. No stridor. No respiratory distress. She has no wheezes. She has no rales. She exhibits no tenderness.  Abdominal: Soft. Bowel sounds are normal. She exhibits no distension and no mass. There is no tenderness. There is no rebound and no guarding.  Musculoskeletal: Normal range of motion. She exhibits no edema and no tenderness.  Lymphadenopathy:    She has no cervical adenopathy.  Neurological: She is oriented to person, place, and time.  Skin: Skin is warm and dry. No rash noted. She is not diaphoretic. No erythema. No pallor.  Psychiatric: She has a normal mood and affect. Judgment and thought content normal. Her mood appears not anxious. Her affect is not angry, not blunt, not  labile and not inappropriate. Her speech is delayed and tangential. Her speech is not  rapid and/or pressured and not slurred. She is slowed. She is not agitated, not aggressive, is not hyperactive, not withdrawn, not actively hallucinating and not combative. Thought content is not paranoid and not delusional. Cognition and memory are impaired. She does not exhibit a depressed mood. She expresses no homicidal and no suicidal ideation. She expresses no suicidal plans and no homicidal plans. She is communicative. She exhibits abnormal recent memory and abnormal remote memory. She is attentive.     Lab Results  Component Value Date   WBC 9.7 06/20/2011   HGB 12.7 06/20/2011   HCT 38.8 06/20/2011   PLT 216.0 06/20/2011   GLUCOSE 97 06/20/2011   CHOL 171 06/20/2011   TRIG 143.0 06/20/2011   HDL 51.50 06/20/2011   LDLDIRECT 201.5 07/05/2010   LDLCALC 91 06/20/2011   ALT 22 06/20/2011   AST 20 06/20/2011   NA 143 06/20/2011   K 3.8 06/20/2011   CL 109 06/20/2011   CREATININE 0.7 06/20/2011   BUN 27* 06/20/2011   CO2 28 06/20/2011   TSH 2.87 06/20/2011   HGBA1C 6.0 06/20/2011       Assessment & Plan:

## 2011-08-08 NOTE — Assessment & Plan Note (Signed)
Her BP is well controlled, I will check her lytes and renal function today 

## 2011-08-08 NOTE — Assessment & Plan Note (Signed)
a1c today 

## 2011-08-08 NOTE — Patient Instructions (Addendum)

## 2011-08-20 ENCOUNTER — Other Ambulatory Visit: Payer: Self-pay

## 2011-08-20 DIAGNOSIS — E782 Mixed hyperlipidemia: Secondary | ICD-10-CM

## 2011-08-20 MED ORDER — ATORVASTATIN CALCIUM 40 MG PO TABS: 40.0000 mg | ORAL_TABLET | Freq: Every day | ORAL | Status: DC

## 2011-08-20 NOTE — Telephone Encounter (Signed)
Faxed refill request   

## 2011-08-26 DIAGNOSIS — M1710 Unilateral primary osteoarthritis, unspecified knee: Secondary | ICD-10-CM | POA: Diagnosis not present

## 2011-08-29 DIAGNOSIS — M1710 Unilateral primary osteoarthritis, unspecified knee: Secondary | ICD-10-CM | POA: Diagnosis not present

## 2011-09-13 ENCOUNTER — Encounter (HOSPITAL_COMMUNITY): Payer: Self-pay | Admitting: Pharmacy Technician

## 2011-09-16 ENCOUNTER — Other Ambulatory Visit: Payer: Self-pay | Admitting: Physician Assistant

## 2011-09-17 ENCOUNTER — Encounter (HOSPITAL_COMMUNITY): Payer: Self-pay

## 2011-09-17 ENCOUNTER — Encounter (HOSPITAL_COMMUNITY)
Admission: RE | Admit: 2011-09-17 | Discharge: 2011-09-17 | Disposition: A | Discharge status: Home / Self Care | Payer: Medicare Other | Source: Ambulatory Visit | Attending: Orthopedic Surgery | Admitting: Orthopedic Surgery

## 2011-09-17 DIAGNOSIS — R0789 Other chest pain: Secondary | ICD-10-CM | POA: Diagnosis not present

## 2011-09-17 DIAGNOSIS — Z01811 Encounter for preprocedural respiratory examination: Secondary | ICD-10-CM | POA: Diagnosis not present

## 2011-09-17 DIAGNOSIS — I10 Essential (primary) hypertension: Secondary | ICD-10-CM | POA: Diagnosis not present

## 2011-09-17 DIAGNOSIS — M25559 Pain in unspecified hip: Secondary | ICD-10-CM | POA: Diagnosis not present

## 2011-09-17 DIAGNOSIS — M1710 Unilateral primary osteoarthritis, unspecified knee: Secondary | ICD-10-CM | POA: Diagnosis not present

## 2011-09-17 DIAGNOSIS — E785 Hyperlipidemia, unspecified: Secondary | ICD-10-CM | POA: Diagnosis not present

## 2011-09-17 HISTORY — DX: Unspecified cataract: H26.9

## 2011-09-17 LAB — CBC WITH DIFFERENTIAL/PLATELET
Basophils Absolute: 0 10*3/uL (ref 0.0–0.1)
Basophils Relative: 0 % (ref 0–1)
Eosinophils Absolute: 0.1 10*3/uL (ref 0.0–0.7)
HCT: 39.9 % (ref 36.0–46.0)
Hemoglobin: 13.1 g/dL (ref 12.0–15.0)
MCH: 29.7 pg (ref 26.0–34.0)
MCHC: 32.8 g/dL (ref 30.0–36.0)
Monocytes Absolute: 0.7 10*3/uL (ref 0.1–1.0)
Monocytes Relative: 7 % (ref 3–12)
Neutrophils Relative %: 65 % (ref 43–77)
RDW: 13.9 % (ref 11.5–15.5)

## 2011-09-17 LAB — PROTIME-INR
INR: 0.97 (ref 0.00–1.49)
Prothrombin Time: 13.1 seconds (ref 11.6–15.2)

## 2011-09-17 LAB — TYPE AND SCREEN
ABO/RH(D): O POS
Antibody Screen: NEGATIVE

## 2011-09-17 LAB — URINALYSIS, ROUTINE W REFLEX MICROSCOPIC
Bilirubin Urine: NEGATIVE
Ketones, ur: 15 mg/dL — AB
Nitrite: NEGATIVE
Protein, ur: NEGATIVE mg/dL
Urobilinogen, UA: 1 mg/dL (ref 0.0–1.0)
pH: 5 (ref 5.0–8.0)

## 2011-09-17 LAB — APTT: aPTT: 31 seconds (ref 24–37)

## 2011-09-17 LAB — COMPREHENSIVE METABOLIC PANEL
AST: 21 U/L (ref 0–37)
Albumin: 3.8 g/dL (ref 3.5–5.2)
BUN: 22 mg/dL (ref 6–23)
Calcium: 10.5 mg/dL (ref 8.4–10.5)
Creatinine, Ser: 0.98 mg/dL (ref 0.50–1.10)
Total Protein: 7.1 g/dL (ref 6.0–8.3)

## 2011-09-17 LAB — URINE MICROSCOPIC-ADD ON

## 2011-09-17 LAB — SURGICAL PCR SCREEN
MRSA, PCR: NEGATIVE
Staphylococcus aureus: NEGATIVE

## 2011-09-17 MED ORDER — CHLORHEXIDINE GLUCONATE 4 % EX LIQD: 60.0000 mL | Freq: Once | CUTANEOUS | Status: DC

## 2011-09-17 NOTE — Progress Notes (Signed)
Contacted Dr. Candise Bowens, states ok for patient to continue to use voltaren gel 1%

## 2011-09-17 NOTE — Pre-Procedure Instructions (Signed)
20 Virginia Edwards  09/17/2011   Your procedure is scheduled on:  Friday September 27, 2011  Report to [facility] at 8:45 AM.  Call this number if you have problems the morning of surgery: [phone]   Remember:   Do not eat food or drink After Midnight.      Take these medicines the morning of surgery with A SIP OF WATER: none   Do not wear jewelry, make-up or nail polish.  Do not wear lotions, powders, or perfumes. You may wear deodorant.  Do not shave 48 hours prior to surgery. Men may shave face and neck.  Do not bring valuables to the hospital.  Contacts, dentures or bridgework may not be worn into surgery.  Leave suitcase in the car. After surgery it may be brought to your room.  For patients admitted to the hospital, checkout time is 11:00 AM the day of discharge.   Patients discharged the day of surgery will not be allowed to drive home.  Name and phone number of your driver: Darrick Penna [phone]  Special Instructions: Incentive Spirometry - Practice and bring it with you on the day of surgery. and CHG Shower Use Special Wash: 1/2 bottle night before surgery and 1/2 bottle morning of surgery.   Please read over the following fact sheets that you were given: Pain Booklet, Coughing and Deep Breathing, Blood Transfusion Information, Total Joint Packet, MRSA Information and Surgical Site Infection Prevention

## 2011-09-18 LAB — ABO/RH: ABO/RH(D): O POS

## 2011-09-18 LAB — URINE CULTURE

## 2011-09-25 NOTE — H&P (Signed)
 NAME: Virginia Edwards MRN: #[id number] DATE: September 17, 2011 DOB: 08/13/1925  CHIEF COMPLAINT: Follow-up right knee osteoarthritis.    HISTORY OF PRESENT ILLNESS: The patient is an 78-year-old female with a history of right knee osteoarthritis end-stage.  Also she has hypertension and high cholesterol. We have been following her in the office for over a year now with severe osteoarthritis of her bilateral knees right greater than left.  She has failed conservative treatments with corticosteroid injection and viscosupplementation, p.o. NSAIDs, a walker and a cane.  She is here to discuss possible total knee arthroplasty. Her right knee pain is significantly affecting her quality of life and activities of daily living and it does awaken her from sleep at night on occasion.    REVIEW OF SYSTEMS: A ten point review of systems was obtained and positive for high blood pressure and skin rashes.    PAST MEDICAL HISTORY:  Bilateral knee OA, hypertension and high cholesterol.  She describes the above-mentioned skin rashes as occasional whitehead-appearing bumps that will pop up on occasion.  She has been seen and treated by dermatologist.  No current rashes and no current treatment.  PAST SURGICAL HISTORY: Her history includes hysterectomy in 1984, shoulder surgery in 2008 and "growth on back" in 2011.    MEDICATIONS:  Her medications include atorvastatin 40 mg once daily and also occasional Voltaren gel up to 4 times a day when using and Naprosyn 375 mg twice a day.  She lists one other medication that I am unable to read it; possibly Tribenzor 40 x 5 x 12 once daily.  ALLERGIES: She states she has drug allergies to ibuprofen which caused a large blister in the past and also Fosamax caused throat swelling.  FAMILY HISTORY: Positive for her father with cancer, her brother for heart attack and sister with high blood pressure.  SOCIAL HISTORY: The patient is a nonsmoker and nondrinker.  She is widowed and lives  alone in a one-story residence.  She has living children and also states there would be somebody to help her following the surgery at her home.    PHYSICAL EXAMINATION: On exam the patient is seated in the exam room in no acute distress. She is alert and oriented and appears appropriate age.  Height 5 foot 2 inches and weight 184 pounds.  BMI calculated at 33.7.  Vital signs: Temperature 97.7, pulse 68, respiration 18 and blood pressure 149/77.  HEENT: Pupils are equal, round and react to light.  Neck: Supple with good range of motion.  Lungs: Clear to auscultation bilaterally, normal breath sounds and normal effort.  Cardiac: She may have a mild systolic murmur; otherwise regular rate and rhythm.  Abdomen:  Soft, nontender and positive bowel sounds in all 4 quadrants.  Cranial nerves grossly intact.  Skin no rashes or lesions seen today.  GU/rectal/rectal/breast not indicated for surgery. Musculoskeletal examination:  Right lower extremity shows she is neurovascular intact.  She has intact dorsiflexion and plantarflexion of the ankle and positive joint line tenderness in the knee.  She has positive patellofemoral crepitus.  She has no tenderness to palpation of the calf and stable ligamentous testing.    IMAGING: No new images were obtained today.  There are x-rays in the system from 08/26/2011 showing bone on bone arthritis, tricompartmental, in the right knee on 2 views.    ASSESSMENT:  1. Bilateral knee OA, right greater than left, endstage. 2. Hypertension. 3. High cholesterol.  PLAN: The risks and benefits of right   total knee arthroplasty were discussed again today and the patient wishes to proceed.  She has a scheduled preadmission appointment and surgery scheduled for 09/27/2011.  She does wish to go home following the surgery with some help and home health.  She has acquired preoperative medical clearance from primary care physician, Dr. Thomas Jones.  She was given preoperative instructions and  we also discussed postoperative DVT prophylaxis and perioperative antibiotics.  She will be given Ancef perioperatively and Lovenox following surgery. If she has any questions or concerns prior to surgery she will contact the office.       Josh Enio Hornback, P.A.-C/[id number]  Auto-Authenticated by Josh Takaya Hyslop, P.A.-C 

## 2011-09-26 MED ORDER — CEFAZOLIN SODIUM-DEXTROSE 2-3 GM-% IV SOLR
2.0000 g | INTRAVENOUS | Status: DC
  Filled 2011-09-26: qty 50

## 2011-09-27 ENCOUNTER — Encounter (HOSPITAL_COMMUNITY): Payer: Self-pay | Admitting: Anesthesiology

## 2011-09-27 ENCOUNTER — Inpatient Hospital Stay (HOSPITAL_COMMUNITY)
Admission: RE | Admit: 2011-09-27 | Discharge: 2011-09-28 | DRG: 313 | Disposition: A | Discharge status: Home / Self Care | Payer: Medicare Other | Source: Ambulatory Visit | Attending: Internal Medicine | Admitting: Internal Medicine

## 2011-09-27 ENCOUNTER — Encounter (HOSPITAL_COMMUNITY)
Admission: RE | Disposition: A | Discharge status: Home / Self Care | Payer: Self-pay | Source: Ambulatory Visit | Attending: Internal Medicine

## 2011-09-27 ENCOUNTER — Encounter (HOSPITAL_COMMUNITY): Payer: Self-pay | Admitting: *Deleted

## 2011-09-27 ENCOUNTER — Encounter (HOSPITAL_COMMUNITY): Payer: Self-pay | Admitting: General Practice

## 2011-09-27 ENCOUNTER — Inpatient Hospital Stay (HOSPITAL_COMMUNITY): Payer: Medicare Other | Admitting: Anesthesiology

## 2011-09-27 DIAGNOSIS — H269 Unspecified cataract: Secondary | ICD-10-CM | POA: Diagnosis present

## 2011-09-27 DIAGNOSIS — I517 Cardiomegaly: Secondary | ICD-10-CM | POA: Diagnosis not present

## 2011-09-27 DIAGNOSIS — Z7982 Long term (current) use of aspirin: Secondary | ICD-10-CM

## 2011-09-27 DIAGNOSIS — Z5309 Procedure and treatment not carried out because of other contraindication: Secondary | ICD-10-CM | POA: Diagnosis not present

## 2011-09-27 DIAGNOSIS — IMO0002 Reserved for concepts with insufficient information to code with codable children: Secondary | ICD-10-CM | POA: Diagnosis not present

## 2011-09-27 DIAGNOSIS — L259 Unspecified contact dermatitis, unspecified cause: Secondary | ICD-10-CM | POA: Diagnosis present

## 2011-09-27 DIAGNOSIS — R079 Chest pain, unspecified: Secondary | ICD-10-CM | POA: Diagnosis not present

## 2011-09-27 DIAGNOSIS — E785 Hyperlipidemia, unspecified: Secondary | ICD-10-CM | POA: Diagnosis not present

## 2011-09-27 DIAGNOSIS — M1710 Unilateral primary osteoarthritis, unspecified knee: Secondary | ICD-10-CM | POA: Diagnosis not present

## 2011-09-27 DIAGNOSIS — E7800 Pure hypercholesterolemia, unspecified: Secondary | ICD-10-CM | POA: Diagnosis present

## 2011-09-27 DIAGNOSIS — R0789 Other chest pain: Principal | ICD-10-CM | POA: Diagnosis present

## 2011-09-27 DIAGNOSIS — M810 Age-related osteoporosis without current pathological fracture: Secondary | ICD-10-CM | POA: Diagnosis present

## 2011-09-27 DIAGNOSIS — Z6831 Body mass index (BMI) 31.0-31.9, adult: Secondary | ICD-10-CM | POA: Diagnosis not present

## 2011-09-27 DIAGNOSIS — E663 Overweight: Secondary | ICD-10-CM | POA: Diagnosis present

## 2011-09-27 DIAGNOSIS — Z01812 Encounter for preprocedural laboratory examination: Secondary | ICD-10-CM

## 2011-09-27 DIAGNOSIS — Z79899 Other long term (current) drug therapy: Secondary | ICD-10-CM | POA: Diagnosis not present

## 2011-09-27 DIAGNOSIS — Z886 Allergy status to analgesic agent status: Secondary | ICD-10-CM

## 2011-09-27 DIAGNOSIS — E782 Mixed hyperlipidemia: Secondary | ICD-10-CM

## 2011-09-27 DIAGNOSIS — I10 Essential (primary) hypertension: Secondary | ICD-10-CM | POA: Diagnosis not present

## 2011-09-27 DIAGNOSIS — G8918 Other acute postprocedural pain: Secondary | ICD-10-CM | POA: Diagnosis not present

## 2011-09-27 HISTORY — PX: TOTAL KNEE ARTHROPLASTY: SHX125

## 2011-09-27 LAB — CK TOTAL AND CKMB (NOT AT ARMC)
Relative Index: 1.5 (ref 0.0–2.5)
Total CK: 504 U/L — ABNORMAL HIGH (ref 7–177)

## 2011-09-27 SURGERY — ARTHROPLASTY, KNEE, TOTAL
Anesthesia: General | Site: Knee | Laterality: Right

## 2011-09-27 MED ORDER — HYDROCHLOROTHIAZIDE 12.5 MG PO CAPS
12.5000 mg | ORAL_CAPSULE | Freq: Every day | ORAL | Status: DC
  Administered 2011-09-28: 12.5 mg via ORAL
  Filled 2011-09-27: qty 1

## 2011-09-27 MED ORDER — REGADENOSON 0.4 MG/5ML IV SOLN
0.4000 mg | Freq: Once | INTRAVENOUS | Status: AC
  Administered 2011-09-28: 0.4 mg via INTRAVENOUS
  Filled 2011-09-27: qty 5

## 2011-09-27 MED ORDER — ONDANSETRON HCL 4 MG/2ML IJ SOLN: 4.0000 mg | Freq: Four times a day (QID) | INTRAMUSCULAR | Status: DC | PRN

## 2011-09-27 MED ORDER — IRBESARTAN 300 MG PO TABS
300.0000 mg | ORAL_TABLET | Freq: Every day | ORAL | Status: DC
  Administered 2011-09-28: 300 mg via ORAL
  Filled 2011-09-27: qty 1

## 2011-09-27 MED ORDER — HYDROMORPHONE HCL PF 1 MG/ML IJ SOLN: 0.2500 mg | INTRAMUSCULAR | Status: DC | PRN

## 2011-09-27 MED ORDER — SODIUM CHLORIDE 0.9 % IJ SOLN
3.0000 mL | Freq: Two times a day (BID) | INTRAMUSCULAR | Status: DC
  Administered 2011-09-27: 3 mL via INTRAVENOUS

## 2011-09-27 MED ORDER — LACTATED RINGERS IV SOLN
INTRAVENOUS | Status: DC | PRN
  Administered 2011-09-27: 11:00:00 via INTRAVENOUS

## 2011-09-27 MED ORDER — HEPARIN SODIUM (PORCINE) 5000 UNIT/ML IJ SOLN
5000.0000 [IU] | Freq: Three times a day (TID) | INTRAMUSCULAR | Status: DC
  Administered 2011-09-27 – 2011-09-28 (×3): 5000 [IU] via SUBCUTANEOUS
  Filled 2011-09-27 (×6): qty 1

## 2011-09-27 MED ORDER — B COMPLEX-C PO TABS
1.0000 | ORAL_TABLET | Freq: Every day | ORAL | Status: DC
  Administered 2011-09-27 – 2011-09-28 (×2): 1 via ORAL
  Filled 2011-09-27 (×2): qty 1

## 2011-09-27 MED ORDER — FENTANYL CITRATE 0.05 MG/ML IJ SOLN
INTRAMUSCULAR | Status: AC
  Filled 2011-09-27: qty 2

## 2011-09-27 MED ORDER — SODIUM CHLORIDE 0.9 % IV SOLN: INTRAVENOUS | Status: DC

## 2011-09-27 MED ORDER — ACETAMINOPHEN 325 MG PO TABS
650.0000 mg | ORAL_TABLET | ORAL | Status: DC | PRN
  Administered 2011-09-27 – 2011-09-28 (×3): 650 mg via ORAL
  Filled 2011-09-27 (×3): qty 2

## 2011-09-27 MED ORDER — DICLOFENAC SODIUM 1 % TD GEL
2.0000 g | Freq: Four times a day (QID) | TRANSDERMAL | Status: DC
  Administered 2011-09-27 – 2011-09-28 (×4): 2 g via TOPICAL
  Filled 2011-09-27: qty 100

## 2011-09-27 MED ORDER — ACETAMINOPHEN 10 MG/ML IV SOLN: 1000.0000 mg | Freq: Four times a day (QID) | INTRAVENOUS | Status: DC

## 2011-09-27 MED ORDER — ASPIRIN 300 MG RE SUPP: 300.0000 mg | RECTAL | Status: DC

## 2011-09-27 MED ORDER — FENTANYL CITRATE 0.05 MG/ML IJ SOLN
50.0000 ug | INTRAMUSCULAR | Status: DC | PRN
  Administered 2011-09-27: 50 ug via INTRAVENOUS
  Administered 2011-09-27 (×2): 25 ug via INTRAVENOUS

## 2011-09-27 MED ORDER — NITROGLYCERIN 0.4 MG SL SUBL: 0.4000 mg | SUBLINGUAL_TABLET | SUBLINGUAL | Status: DC | PRN

## 2011-09-27 MED ORDER — BISACODYL 5 MG PO TBEC
5.0000 mg | DELAYED_RELEASE_TABLET | Freq: Every day | ORAL | Status: DC | PRN
  Filled 2011-09-27: qty 1

## 2011-09-27 MED ORDER — SODIUM CHLORIDE 0.9 % IR SOLN
Status: DC | PRN
  Administered 2011-09-27: 1000 mL

## 2011-09-27 MED ORDER — B COMPLEX PO TABS: 1.0000 | ORAL_TABLET | Freq: Every day | ORAL | Status: DC

## 2011-09-27 MED ORDER — VITAMIN D3 25 MCG (1000 UNIT) PO TABS
1000.0000 [IU] | ORAL_TABLET | Freq: Every day | ORAL | Status: DC
  Administered 2011-09-27 – 2011-09-28 (×2): 1000 [IU] via ORAL
  Filled 2011-09-27 (×2): qty 1

## 2011-09-27 MED ORDER — LACTATED RINGERS IV SOLN
INTRAVENOUS | Status: DC
  Administered 2011-09-27: 10:00:00 via INTRAVENOUS

## 2011-09-27 MED ORDER — MIDAZOLAM HCL 2 MG/2ML IJ SOLN
1.0000 mg | INTRAMUSCULAR | Status: DC | PRN
  Administered 2011-09-27: 1.5 mg via INTRAVENOUS
  Administered 2011-09-27: 0.5 mg via INTRAVENOUS

## 2011-09-27 MED ORDER — AMLODIPINE BESYLATE 5 MG PO TABS
5.0000 mg | ORAL_TABLET | Freq: Every day | ORAL | Status: DC
  Administered 2011-09-28: 5 mg via ORAL
  Filled 2011-09-27: qty 1

## 2011-09-27 MED ORDER — ASPIRIN EC 81 MG PO TBEC
81.0000 mg | DELAYED_RELEASE_TABLET | Freq: Every day | ORAL | Status: DC
  Administered 2011-09-28: 81 mg via ORAL
  Filled 2011-09-27: qty 1

## 2011-09-27 MED ORDER — SODIUM CHLORIDE 0.9 % IV SOLN: 250.0000 mL | INTRAVENOUS | Status: DC | PRN

## 2011-09-27 MED ORDER — ATORVASTATIN CALCIUM 40 MG PO TABS
40.0000 mg | ORAL_TABLET | Freq: Every day | ORAL | Status: DC
  Administered 2011-09-27: 40 mg via ORAL
  Filled 2011-09-27 (×2): qty 1

## 2011-09-27 MED ORDER — VITAMIN D3 25 MCG (1000 UT) PO CAPS: 1000.0000 [IU] | ORAL_CAPSULE | Freq: Every day | ORAL | Status: DC

## 2011-09-27 MED ORDER — MIDAZOLAM HCL 2 MG/2ML IJ SOLN
INTRAMUSCULAR | Status: AC
  Filled 2011-09-27: qty 2

## 2011-09-27 MED ORDER — OLMESARTAN-AMLODIPINE-HCTZ 40-5-12.5 MG PO TABS: 1.0000 | ORAL_TABLET | Freq: Every day | ORAL | Status: DC

## 2011-09-27 MED ORDER — ASPIRIN 81 MG PO CHEW
324.0000 mg | CHEWABLE_TABLET | ORAL | Status: AC
  Administered 2011-09-27: 324 mg via ORAL
  Filled 2011-09-27: qty 1

## 2011-09-27 MED ORDER — FENTANYL CITRATE 0.05 MG/ML IJ SOLN
25.0000 ug | Freq: Once | INTRAMUSCULAR | Status: AC
  Administered 2011-09-27: 25 ug via INTRAVENOUS

## 2011-09-27 MED ORDER — SODIUM CHLORIDE 0.9 % IJ SOLN: 3.0000 mL | INTRAMUSCULAR | Status: DC | PRN

## 2011-09-27 MED ORDER — ACETAMINOPHEN 10 MG/ML IV SOLN
INTRAVENOUS | Status: AC
  Filled 2011-09-27: qty 100

## 2011-09-27 SURGICAL SUPPLY — 57 items
BANDAGE ESMARK 6X9 LF (GAUZE/BANDAGES/DRESSINGS) ×1 IMPLANT
BLADE SAGITTAL 25.0X1.19X90 (BLADE) ×2 IMPLANT
BLADE SAW SAG 90X13X1.27 (BLADE) ×2 IMPLANT
BNDG ESMARK 6X9 LF (GAUZE/BANDAGES/DRESSINGS) ×2
BOWL SMART MIX CTS (DISPOSABLE) ×2 IMPLANT
CEMENT HV SMART SET (Cement) ×4 IMPLANT
CLOTH BEACON ORANGE TIMEOUT ST (SAFETY) ×2 IMPLANT
COVER BACK TABLE 24X17X13 BIG (DRAPES) IMPLANT
COVER SURGICAL LIGHT HANDLE (MISCELLANEOUS) ×2 IMPLANT
CUFF TOURNIQUET SINGLE 34IN LL (TOURNIQUET CUFF) ×2 IMPLANT
CUFF TOURNIQUET SINGLE 44IN (TOURNIQUET CUFF) IMPLANT
DRAPE INCISE IOBAN 66X45 STRL (DRAPES) IMPLANT
DRAPE ORTHO SPLIT 77X108 STRL (DRAPES) ×2
DRAPE SURG ORHT 6 SPLT 77X108 (DRAPES) ×2 IMPLANT
DRAPE U-SHAPE 47X51 STRL (DRAPES) ×2 IMPLANT
DRSG ADAPTIC 3X8 NADH LF (GAUZE/BANDAGES/DRESSINGS) ×2 IMPLANT
DRSG PAD ABDOMINAL 8X10 ST (GAUZE/BANDAGES/DRESSINGS) ×2 IMPLANT
DURAPREP 26ML APPLICATOR (WOUND CARE) ×2 IMPLANT
ELECT REM PT RETURN 9FT ADLT (ELECTROSURGICAL) ×2
ELECTRODE REM PT RTRN 9FT ADLT (ELECTROSURGICAL) ×1 IMPLANT
EVACUATOR 1/8 PVC DRAIN (DRAIN) ×2 IMPLANT
FACESHIELD LNG OPTICON STERILE (SAFETY) ×4 IMPLANT
FLOSEAL 10ML (HEMOSTASIS) IMPLANT
GLOVE BIOGEL PI IND STRL 8 (GLOVE) ×4 IMPLANT
GLOVE BIOGEL PI INDICATOR 8 (GLOVE) ×4
GLOVE ORTHO TXT STRL SZ7.5 (GLOVE) ×6 IMPLANT
GLOVE SURG ORTHO 8.0 STRL STRW (GLOVE) ×6 IMPLANT
GOWN PREVENTION PLUS XLARGE (GOWN DISPOSABLE) ×2 IMPLANT
GOWN PREVENTION PLUS XXLARGE (GOWN DISPOSABLE) ×2 IMPLANT
GOWN STRL NON-REIN LRG LVL3 (GOWN DISPOSABLE) ×4 IMPLANT
HANDPIECE INTERPULSE COAX TIP (DISPOSABLE) ×1
HOOD PEEL AWAY FACE SHEILD DIS (HOOD) ×2 IMPLANT
IMMOBILIZER KNEE 22 UNIV (SOFTGOODS) IMPLANT
KIT BASIN OR (CUSTOM PROCEDURE TRAY) ×2 IMPLANT
KIT ROOM TURNOVER OR (KITS) ×2 IMPLANT
MANIFOLD NEPTUNE II (INSTRUMENTS) ×2 IMPLANT
NEEDLE 22X1 1/2 (OR ONLY) (NEEDLE) IMPLANT
NS IRRIG 1000ML POUR BTL (IV SOLUTION) ×2 IMPLANT
PACK TOTAL JOINT (CUSTOM PROCEDURE TRAY) ×2 IMPLANT
PAD ARMBOARD 7.5X6 YLW CONV (MISCELLANEOUS) ×4 IMPLANT
PAD CAST 4YDX4 CTTN HI CHSV (CAST SUPPLIES) ×1 IMPLANT
PADDING CAST COTTON 4X4 STRL (CAST SUPPLIES) ×1
PADDING CAST COTTON 6X4 STRL (CAST SUPPLIES) ×2 IMPLANT
SET HNDPC FAN SPRY TIP SCT (DISPOSABLE) ×1 IMPLANT
SPONGE GAUZE 4X4 12PLY (GAUZE/BANDAGES/DRESSINGS) ×2 IMPLANT
STAPLER VISISTAT 35W (STAPLE) ×2 IMPLANT
SUCTION FRAZIER TIP 10 FR DISP (SUCTIONS) ×2 IMPLANT
SUT ETHIBOND NAB CT1 #1 30IN (SUTURE) ×6 IMPLANT
SUT VIC AB 0 CT1 27 (SUTURE) ×1
SUT VIC AB 0 CT1 27XBRD ANBCTR (SUTURE) ×1 IMPLANT
SUT VIC AB 2-0 CT1 27 (SUTURE) ×2
SUT VIC AB 2-0 CT1 TAPERPNT 27 (SUTURE) ×2 IMPLANT
SYR CONTROL 10ML LL (SYRINGE) IMPLANT
TOWEL OR 17X24 6PK STRL BLUE (TOWEL DISPOSABLE) ×2 IMPLANT
TOWEL OR 17X26 10 PK STRL BLUE (TOWEL DISPOSABLE) ×2 IMPLANT
TRAY FOLEY CATH 14FR (SET/KITS/TRAYS/PACK) ×2 IMPLANT
WATER STERILE IRR 1000ML POUR (IV SOLUTION) ×6 IMPLANT

## 2011-09-27 NOTE — Anesthesia Procedure Notes (Signed)
Anesthesia Regional Block:  Femoral nerve block  Pre-Anesthetic Checklist: ,, timeout performed, Correct Patient, Correct Site, Correct Laterality, Correct Procedure, Correct Position, site marked, Risks and benefits discussed,  Surgical consent,  Pre-op evaluation,  At surgeon's request and post-op pain management  Laterality: Right  Prep: chloraprep       Needles:  Injection technique: Single-shot      Additional Needles:  Procedures: Doppler guided Femoral nerve block  Nerve Stimulator or Paresthesia:  Response: 0.5 mA,   Additional Responses:   Narrative:  Start time: 09/27/2011 10:05 AM End time: 09/27/2011 10:20 AM Injection made incrementally with aspirations every 5 mL. Anesthesiologist: Dr. Randa Evens  Femoral nerve block

## 2011-09-27 NOTE — Transfer of Care (Signed)
Immediate Anesthesia Transfer of Care Note  Patient: Virginia Edwards  Procedure(s) Performed: Procedure(s) (LRB) with comments: TOTAL KNEE ARTHROPLASTY (Right) - right total knee arthroplasty  Patient Location: PACU  Anesthesia Type: procedure cancelled pending cardiac consult due to vent. bigeminy  Level of Consciousness: awake, alert , oriented and patient cooperative  Airway & Oxygen Therapy: Patient Spontanous Breathing and Patient connected to nasal cannula oxygen  Post-op Assessment: Report given to PACU RN, Post -op Vital signs reviewed and stable and Patient moving all extremities  Post vital signs: Reviewed and stable  Complications: No apparent anesthesia complications

## 2011-09-27 NOTE — Preoperative (Signed)
Beta Blockers   Reason not to administer Beta Blockers:Not Applicable 

## 2011-09-27 NOTE — Progress Notes (Signed)
Dr Madelon Lips called to check on pt, updated, aware we are waiting on cardiology consult.  No new orders.  Pt is comfortable, denies any chest pain or discomfort, VSS.  Will cont to monitor.

## 2011-09-27 NOTE — Interval H&P Note (Signed)
History and Physical Interval Note:  09/27/2011 10:13 AM  Virginia Edwards  has presented today for surgery, with the diagnosis of DJD RIGHT KNEE   The various methods of treatment have been discussed with the patient and family. After consideration of risks, benefits and other options for treatment, the patient has consented to  Procedure(s) (LRB) with comments: TOTAL KNEE ARTHROPLASTY (Right) as a surgical intervention .  The patient's history has been reviewed, patient examined, no change in status, stable for surgery.  I have reviewed the patient's chart and labs.  Questions were answered to the patient's satisfaction.     Trennon Torbeck JR,W D

## 2011-09-27 NOTE — Anesthesia Postprocedure Evaluation (Signed)
  Anesthesia Post-op Note  Patient: Virginia Edwards  Procedure(s) Performed: Procedure(s) (LRB) with comments: TOTAL KNEE ARTHROPLASTY (Right) - right total knee arthroplasty  Patient Location: PACU  Anesthesia Type: Femoral nerve block placed for case but case cancelled once in OR  Level of Consciousness: awake, alert  and oriented  Airway and Oxygen Therapy: Patient Spontanous Breathing  Post-op Pain: none  Post-op Assessment: Post-op Vital signs reviewed, Patient's Cardiovascular Status Stable, Respiratory Function Stable, Patent Airway and No signs of Nausea or vomiting  Post-op Vital Signs: Reviewed and stable  Complications: No apparent anesthesia complications

## 2011-09-27 NOTE — H&P (View-Only) (Signed)
NAME: Virginia Edwards MRN: #[id number] DATE: September 17, 2011 DOB: 25-Nov-1925  CHIEF COMPLAINT: Follow-up right knee osteoarthritis.    HISTORY OF PRESENT ILLNESS: The patient is an 78 year old female with a history of right knee osteoarthritis end-stage.  Also she has hypertension and high cholesterol. We have been following her in the office for over a year now with severe osteoarthritis of her bilateral knees right greater than left.  She has failed conservative treatments with corticosteroid injection and viscosupplementation, p.o. NSAIDs, a walker and a cane.  She is here to discuss possible total knee arthroplasty. Her right knee pain is significantly affecting her quality of life and activities of daily living and it does awaken her from sleep at night on occasion.    REVIEW OF SYSTEMS: A ten point review of systems was obtained and positive for high blood pressure and skin rashes.    PAST MEDICAL HISTORY:  Bilateral knee OA, hypertension and high cholesterol.  She describes the above-mentioned skin rashes as occasional whitehead-appearing bumps that will pop up on occasion.  She has been seen and treated by dermatologist.  No current rashes and no current treatment.  PAST SURGICAL HISTORY: Her history includes hysterectomy in 1984, shoulder surgery in 2008 and "growth on back" in 2011.    MEDICATIONS:  Her medications include atorvastatin 40 mg once daily and also occasional Voltaren gel up to 4 times a day when using and Naprosyn 375 mg twice a day.  She lists one other medication that I am unable to read it; possibly Tribenzor 40 x 5 x 12 once daily.  ALLERGIES: She states she has drug allergies to ibuprofen which caused a large blister in the past and also Fosamax caused throat swelling.  FAMILY HISTORY: Positive for her father with cancer, her brother for heart attack and sister with high blood pressure.  SOCIAL HISTORY: The patient is a nonsmoker and nondrinker.  She is widowed and lives  alone in a one-story residence.  She has living children and also states there would be somebody to help her following the surgery at her home.    PHYSICAL EXAMINATION: On exam the patient is seated in the exam room in no acute distress. She is alert and oriented and appears appropriate age.  Height 5 foot 2 inches and weight 184 pounds.  BMI calculated at 33.7.  Vital signs: Temperature 97.7, pulse 68, respiration 18 and blood pressure 149/77.  HEENT: Pupils are equal, round and react to light.  Neck: Supple with good range of motion.  Lungs: Clear to auscultation bilaterally, normal breath sounds and normal effort.  Cardiac: She may have a mild systolic murmur; otherwise regular rate and rhythm.  Abdomen:  Soft, nontender and positive bowel sounds in all 4 quadrants.  Cranial nerves grossly intact.  Skin no rashes or lesions seen today.  GU/rectal/rectal/breast not indicated for surgery. Musculoskeletal examination:  Right lower extremity shows she is neurovascular intact.  She has intact dorsiflexion and plantarflexion of the ankle and positive joint line tenderness in the knee.  She has positive patellofemoral crepitus.  She has no tenderness to palpation of the calf and stable ligamentous testing.    IMAGING: No new images were obtained today.  There are x-rays in the system from 08/26/2011 showing bone on bone arthritis, tricompartmental, in the right knee on 2 views.    ASSESSMENT:  1. Bilateral knee OA, right greater than left, endstage. 2. Hypertension. 3. High cholesterol.  PLAN: The risks and benefits of right  total knee arthroplasty were discussed again today and the patient wishes to proceed.  She has a scheduled preadmission appointment and surgery scheduled for 09/27/2011.  She does wish to go home following the surgery with some help and home health.  She has acquired preoperative medical clearance from primary care physician, Dr. Sanda Linger.  She was given preoperative instructions and  we also discussed postoperative DVT prophylaxis and perioperative antibiotics.  She will be given Ancef perioperatively and Lovenox following surgery. If she has any questions or concerns prior to surgery she will contact the office.       Josh Nastassia Bazaldua, P.A.-C/[id number]  Auto-Authenticated by Estanislado Spire, P.A.-C

## 2011-09-27 NOTE — Consult Note (Addendum)
Patient ID: Virginia Edwards MRN: [id number], DOB/AGE: Sep 14, 1925   Admit date: 09/27/2011 Date of Consult: @TODAY @  Primary Physician: Sanda Linger, MD Primary Cardiologist: New   Problem List: Past Medical History  Diagnosis Date  . Hyperlipidemia   . Osteoporosis   . Arthritis   . Eczema   . Cataracts, bilateral   . Hypertension     primary Dr. Sanda Linger [phone], saw Dr. Jens Som x 1 cardio    Past Surgical History  Procedure Date  . Abdominal hysterectomy   . Fracture surgery     shoulder surgery 2008  . Lipoma excision     2011     Allergies:  Allergies  Allergen Reactions  . Alendronate Sodium Anaphylaxis    Throat swells  . Ibuprofen     blisters    HPI: Patient is an 78 year old who we are asked to see re chest pain  The patient has no known history of CAD .  She does have a history of hyperlipidemia and HTN.  Today was in preop area and developed mid abdominal pain that radiated to chest .  Pain lasted about 1 hour.  Eased off.  In OR developed ventricular bigeminy.  Also with chest pressure develop upsloping ST depression in lead II.   The patient is not that active .  Limited by DJD  Does vacuum 3 rooms at home but it takes her an hour to do it.   She has cut back on activity esp over the last month because of knee pain.  No PND  No orthopnea.  Note in 2008 had shoulder surgery.  Complained of CP  Pt refused admit and r/o.   After discharge did not have further testing  Seen once in cardiology Jens Som)  Patient has intermittent chest tightness that she has atrrib to indigestion in past.  Inpatient Medications:    . acetaminophen  1,000 mg Intravenous Q6H  .  ceFAZolin (ANCEF) IV  2 g Intravenous 60 min Pre-Op  . fentaNYL  25 mcg Intravenous Once    Family History  Problem Relation Age of Onset  . Lung cancer Father      History   Social History  . Marital Status: Widowed    Spouse Name: N/A    Number of Children: N/A  . Years of  Education: N/A   Occupational History  . Not on file.   Social History Main Topics  . Smoking status: Never Smoker   . Smokeless tobacco: Not on file  . Alcohol Use: No  . Drug Use: No  . Sexually Active: Not Currently   Other Topics Concern  . Not on file   Social History Narrative   Caffine drinks-YesSeat belt use-YesRegular exercise-YesSmoke alarm in home-YesGuns/firearms in home-NOHistory of physical abuse-NO     Review of Systems: General: negative for chills, fever, night sweats or weight changes.  Cardiovascular: Negative for, orthopnea, palpitations, paroxysmal nocturnal dyspnea or shortness of breath Dermatological: negative for rash Respiratory: negative for cough or wheezing Urologic: negative for hematuria Abdominal: negative for nausea, vomiting, diarrhea, bright red blood per rectum, melena, or hematemesis Neurologic: negative for visual changes, syncope, or dizziness All other systems reviewed and are otherwise negative except as noted above.  Physical Exam: Filed Vitals:   09/27/11 1400  BP:   Pulse: 79  Temp:   Resp: 17    Intake/Output Summary (Last 24 hours) at 09/27/11 1409 Last data filed at 09/27/11 1158  Gross per 24 hour  Intake  800 ml  Output      0 ml  Net    800 ml    General: Well developed, well nourished, in no acute distress. Head: Normocephalic, atraumatic, sclera non-icteric Neck: Negative for carotid bruits. JVP not elevated. Lungs: Clear bilaterally to auscultation without wheezes, rales, or rhonchi. Breathing is unlabored. Heart: RRR with S1 S2. No murmurs, rubs, or gallops appreciated. Abdomen: Soft, non-tender, non-distended with normoactive bowel sounds. No hepatomegaly. No rebound/guarding. No obvious abdominal masses. Msk:  Strength and tone appears normal for age. Extremities: No clubbing, cyanosis or edema.  Distal pedal pulses are 2+ and equal bilaterally. Neuro: Alert and oriented X 3. Moves all extremities  spontaneously. Psych:  Responds to questions appropriately with a normal affect.  Labs: No results found for this or any previous visit (from the past 24 hour(s)).  Radiology/Studies: Dg Chest 2 View  09/17/2011  *RADIOLOGY REPORT*  Clinical Data: Preoperative chest radiograph.  Hypertension.  CHEST - 2 VIEW  Comparison: 12/04/2009.  02/09/2006.  Findings: Cardiopericardial silhouette upper limits of normal for projection.  Bilateral basilar atelectasis and / or scarring.  No airspace disease.  No effusion.  Thoracic spondylosis.  IMPRESSION: No active cardiopulmonary disease.   Original Report Authenticated By: Andreas Newport, M.D.     EKG:  SR  85 bpm.  Nonspecific ST T wave changes   Tele:  SR with ventricular bigeminy.  Upsloping ST depression II.  ASSESSMENT AND PLAN:  78 year old female who presents for TKA   In preop and OR and abdominal and chest tightness with block  Went away on own  Somewhat atypical  But EKG with vent bigeminy  And with pain she developed ST depression  Given that she is not that active, I cannot clear her for surgery without furhter evaluation.  Age alone puts her at increased risk.  I would recfomm echo and Lexiscan myoview to r/o ischemia.  Given that symptoms and EKG changes at rest (just with block being injected) I would recomm admit , R/O .  If neg myoview tomorro.  2.  HTN  WIll follow  3.  HL  Continue meds. Signed, Dietrich Pates 09/27/2011, 2:09 PM

## 2011-09-27 NOTE — Progress Notes (Signed)
Lunch relief by MA Trevyon Swor RN 

## 2011-09-27 NOTE — Anesthesia Preprocedure Evaluation (Addendum)
Anesthesia Evaluation  Patient identified by MRN, date of birth, ID bandGeneral Assessment Comment:Patient surgery cancelled. Patient complained of indigestion/chest discomfort right before start of surgery. Some bigeminy noted. After being taken to PACU, patient reports no discomfort and no symptoms. Await Cardiology evaluation by [facility]. CE  Reviewed: Allergy & Precautions, H&P , NPO status , Patient's Chart, lab work & pertinent test results, reviewed documented beta blocker date and time   Airway Mallampati: II TM Distance: >3 FB Neck ROM: Full    Dental  (+) Teeth Intact and Dental Advisory Given   Pulmonary neg pulmonary ROS,  breath sounds clear to auscultation        Cardiovascular hypertension, Pt. on medications Rhythm:Regular Rate:Normal     Neuro/Psych    GI/Hepatic negative GI ROS, Neg liver ROS,   Endo/Other  negative endocrine ROS  Renal/GU negative Renal ROS     Musculoskeletal   Abdominal   Peds  Hematology negative hematology ROS (+)   Anesthesia Other Findings   Reproductive/Obstetrics                        Anesthesia Physical Anesthesia Plan  ASA: III  Anesthesia Plan: General   Post-op Pain Management:    Induction: Intravenous  Airway Management Planned: Oral ETT  Additional Equipment:   Intra-op Plan:   Post-operative Plan: Extubation in OR  Informed Consent:   Dental advisory given  Plan Discussed with: CRNA, Anesthesiologist and Surgeon  Anesthesia Plan Comments:         Anesthesia Quick Evaluation

## 2011-09-27 NOTE — Progress Notes (Signed)
Dr. Ross here to see pt.

## 2011-09-28 ENCOUNTER — Inpatient Hospital Stay (HOSPITAL_COMMUNITY): Payer: Medicare Other

## 2011-09-28 DIAGNOSIS — R079 Chest pain, unspecified: Secondary | ICD-10-CM

## 2011-09-28 DIAGNOSIS — R0789 Other chest pain: Secondary | ICD-10-CM | POA: Diagnosis not present

## 2011-09-28 DIAGNOSIS — M1710 Unilateral primary osteoarthritis, unspecified knee: Secondary | ICD-10-CM | POA: Diagnosis not present

## 2011-09-28 DIAGNOSIS — I10 Essential (primary) hypertension: Secondary | ICD-10-CM | POA: Diagnosis not present

## 2011-09-28 DIAGNOSIS — E785 Hyperlipidemia, unspecified: Secondary | ICD-10-CM | POA: Diagnosis not present

## 2011-09-28 DIAGNOSIS — I517 Cardiomegaly: Secondary | ICD-10-CM | POA: Diagnosis not present

## 2011-09-28 LAB — BASIC METABOLIC PANEL
BUN: 23 mg/dL (ref 6–23)
CO2: 26 mEq/L (ref 19–32)
Chloride: 106 mEq/L (ref 96–112)
Creatinine, Ser: 0.73 mg/dL (ref 0.50–1.10)
GFR calc Af Amer: 88 mL/min — ABNORMAL LOW (ref >90)
Glucose, Bld: 91 mg/dL (ref 70–99)
Potassium: 3.4 mEq/L — ABNORMAL LOW (ref 3.5–5.1)

## 2011-09-28 LAB — CBC
HCT: 34.8 % — ABNORMAL LOW (ref 36.0–46.0)
Hemoglobin: 11.3 g/dL — ABNORMAL LOW (ref 12.0–15.0)
MCV: 90.2 fL (ref 78.0–100.0)
RBC: 3.86 MIL/uL — ABNORMAL LOW (ref 3.87–5.11)
WBC: 7.2 10*3/uL (ref 4.0–10.5)

## 2011-09-28 LAB — CK TOTAL AND CKMB (NOT AT ARMC)
Relative Index: 1.2 (ref 0.0–2.5)
Total CK: 521 U/L — ABNORMAL HIGH (ref 7–177)

## 2011-09-28 LAB — TROPONIN I
Troponin I: <0.3 ng/mL (ref <0.30)
Troponin I: <0.3 ng/mL (ref <0.30)

## 2011-09-28 MED ORDER — TECHNETIUM TC 99M TETROFOSMIN IV KIT
30.0000 | PACK | Freq: Once | INTRAVENOUS | Status: AC | PRN
  Administered 2011-09-28: 30 via INTRAVENOUS

## 2011-09-28 MED ORDER — REGADENOSON 0.4 MG/5ML IV SOLN: 0.4000 mg | Freq: Once | INTRAVENOUS | Status: DC

## 2011-09-28 MED ORDER — POTASSIUM CHLORIDE CRYS ER 20 MEQ PO TBCR
40.0000 meq | EXTENDED_RELEASE_TABLET | Freq: Once | ORAL | Status: AC
  Administered 2011-09-28: 40 meq via ORAL
  Filled 2011-09-28: qty 2

## 2011-09-28 MED ORDER — ASPIRIN 81 MG PO TABS: 81.0000 mg | ORAL_TABLET | Freq: Every day | ORAL | Status: AC

## 2011-09-28 MED ORDER — TECHNETIUM TC 99M TETROFOSMIN IV KIT
10.0000 | PACK | Freq: Once | INTRAVENOUS | Status: AC | PRN
  Administered 2011-09-28: 10 via INTRAVENOUS

## 2011-09-28 NOTE — Progress Notes (Signed)
  Echocardiogram 2D Echocardiogram has been performed.  Renada Cronin FRANCES 09/28/2011, 11:28 AM

## 2011-09-28 NOTE — Progress Notes (Signed)
Virginia Edwards  78 y.o.  female  Subjective: No problems overnight. Feels fine and is eager for discharge.  Allergy: Alendronate sodium and Ibuprofen  Objective: Vital signs in last 24 hours: Temp:  [97.8 F (36.6 C)-99.1 F (37.3 C)] 97.8 F (36.6 C) (09/07 0500) Pulse Rate:  [69-102] 69  (09/07 0500) Resp:  [12-21] 17  (09/06 1739) BP: (88-161)/(56-77) 161/72 mmHg (09/07 1001) SpO2:  [93 %-100 %] 97 % (09/07 0500) Weight:  [81.194 kg (179 lb)] 81.194 kg (179 lb) (09/06 1739)  81.194 kg (179 lb) Body mass index is 31.71 kg/(m^2).  Weight change:  Last BM Date: 09/25/11  Intake/Output from previous day: 09/06 0701 - 09/07 0700 In: 803 [I.V.:803] Out: -   General- Well developed; no acute distress; overweight Neck- No JVD, no carotid bruits Lungs- clear lung fields; normal I:E ratio Cardiovascular- normal PMI; normal S1 and S2; modest systolic ejection murmur Abdomen- normal bowel sounds; soft and non-tender without masses or organomegaly Skin- Warm, no significant lesions Extremities- Nl distal pulses; no edema; no femoral artery bruits  Lab Results: Cardiac Markers:   Basename 09/28/11 0911 09/28/11 0219  TROPONINI <0.30 <0.30   CBC:   Basename 09/28/11 0219  WBC 7.2  HGB 11.3*  HCT 34.8*  PLT 170   BMET:  Basename 09/28/11 0219  NA 141  K 3.4*  CL 106  CO2 26  GLUCOSE 91  BUN 23  CREATININE 0.73  CALCIUM 9.6   Medications:  I have reviewed the patient's current medications.  EKG:  Normal sinus rhythm; left atrial abnormality; nonspecific ST-T wave abnormality; no significant change since 09/27/11 or 09/16/28.  Assessment/Plan: Nonspecific chest pain with negative cardiac markers and no significant EKG changes. Echocardiography and pharmacologic stress nuclear study are pending and plan for today. If negative, she can be discharged and rescheduled for her surgery.  Hospitalization should be characterized as an observation stay.   LOS: 1 day   Time of  Visit-16 minutes  Orleans Bing 09/28/2011, 10:13 AM

## 2011-09-28 NOTE — Discharge Summary (Signed)
CARDIOLOGY DISCHARGE SUMMARY   Patient ID: Virginia Edwards MRN: [id number] DOB/AGE: 1925-02-21 78 y.o.  Admit date: 09/27/2011 Discharge date: 09/28/2011  Primary Discharge Diagnosis:  Chest pain Secondary Discharge Diagnosis:  Past Medical History  Diagnosis Date  . Hyperlipidemia   . Osteoporosis   . Arthritis   . Eczema   . Cataracts, bilateral   . Hypertension     primary Dr. Sanda Linger [phone], saw Dr. Jens Som x 1 cardio   Procedures:Pharmacologic stress [facility] Course: Virginia Edwards is an 78 year old female with no documented coronary artery disease. She came to the hospital for a right knee replacement. While been prepared for the procedure, she reported chest discomfort, which was associated with ventricular bigeminy. The surgery was canceled and she was taken to PACU. She was seen there by Dr. Tenny Craw and admitted for further evaluation and treatment.  She had no further episodes of chest pain. Her cardiac enzymes showed some elevation in the CK-MB but her index was normal and the troponins were normal indicating her enzymes were negative for MI. A stress Myoview was performed on 09/28/2011. The results are listed below. Dr. Dietrich Pates evaluated the patient and the images and felt this was a low-risk study. Of note, marked breast attenuation was present and may have contributed to some of the abnormalities described in the report prepared by Radiology.  Left ventricular systolic function was normal; however, there was decreased systolic accentuation of activity in the inferolateral segment.  A return visit with patient's primary cardiologist, Dr. Jens Som, will be arranged as an outpatient at which time he can consider whether additional diagnostic testing, specifically outpatient cardiac catheterization, is warranted.. The results were reviewed with the patient/family and explained carefully.   Aspirin 81 mg daily was added to her medication regimen. She was ambulating without  chest pain or shortness of breath. She is considered stable for discharge, to follow up with cardiology as an outpatient. She agrees to defer the surgery until seen by Dr Jens Som.  Labs:   Lab Results  Component Value Date   WBC 7.2 09/28/2011   HGB 11.3* 09/28/2011   HCT 34.8* 09/28/2011   MCV 90.2 09/28/2011   PLT 170 09/28/2011    Lab 09/28/11 0219  NA 141  K 3.4*  CL 106  CO2 26  BUN 23  CREATININE 0.73  CALCIUM 9.6  PROT --  BILITOT --  ALKPHOS --  ALT --  AST --  GLUCOSE 91    Basename 09/28/11 0911 09/28/11 0219 09/27/11 2024  CKTOTAL 556* 521* 504*  CKMB 6.6* 6.5* 7.5*  CKMBINDEX 1.2 1.2 1.5  TROPONINI <0.30 <0.30 <0.30   Lipid Panel     Component Value Date/Time   CHOL 177 08/08/2011 1102   TRIG 164.0* 08/08/2011 1102   HDL 53.60 08/08/2011 1102   CHOLHDL 3 08/08/2011 1102   VLDL 32.8 08/08/2011 1102   LDLCALC 91 08/08/2011 1102     Radiology: Dg Chest 2 View 09/17/2011  *RADIOLOGY REPORT*  Clinical Data: Preoperative chest radiograph.  Hypertension.  CHEST - 2 VIEW  Comparison: 12/04/2009.  02/09/2006.  Findings: Cardiopericardial silhouette upper limits of normal for projection.  Bilateral basilar atelectasis and / or scarring.  No airspace disease.  No effusion.  Thoracic spondylosis.  IMPRESSION: No active cardiopulmonary disease.   Original Report Authenticated By: Andreas Newport, M.D.    Nm Myocar Multi W/spect W/wall Motion / Ef 09/28/2011  *RADIOLOGY REPORT*  Clinical Data:  Chest pain  MYOCARDIAL IMAGING WITH SPECT (  REST AND PHARMACOLOGIC-STRESS) GATED LEFT VENTRICULAR WALL MOTION STUDY LEFT VENTRICULAR EJECTION FRACTION  Technique:  Standard myocardial SPECT imaging was performed after resting intravenous injection of 11 mCi Tc-65m tetrofosmin. Subsequently, intravenous infusion of regadenoson was performed under the supervision of the Cardiology staff.  At peak effect of the drug, 33 mCi Tc-35m tetrofosmin was injected intravenously and standard myocardial SPECT   imaging was performed.  Quantitative gated imaging was also performed to evaluate left ventricular wall motion, and estimate left ventricular ejection fraction.  Comparison:  None.  Findings: Moderate fixed defect in the inferolateral wall extending from the base to the apex.  There is a smaller fixed defect in the anteroseptal wall in the mid, and apical ventricle.  There is a small focus of relatively decreased radiotracer activity in the anterior wall at the apex on the stress views consistent with a small reversible component.  The end-diastolic volume is 64 ml, the end systolic volume is 23 ml yielding an estimated ejection fraction of 65%.  There is mildly decreased wall motion in the inferolateral wall consistent with prior infarction.  IMPRESSION:  1.  Subtle reversible defect in the apical anterior wall superimposed on a small fixed defect in the anteroseptal wall of the mid ventricle and apex. These findings suggest an area of prior infarction with some very mild reversible ischemia anteriorly at the apex. 2.  Moderate fixed defect in the inferolateral wall extending from the base to the apex with associated decreased wall motion consistent with an area of prior infarction. 3.  Normal estimated ejection fraction of 65%.   Original Report Authenticated ByVilma Prader    EKG: 28-Sep-2011 05:55:23   Normal sinus rhythm Normal ECG 44mm/s 30mm/mV 100Hz  8.0.1 12SL 241 CID: 1 Referred by: Unconfirmed Vent. rate 68 BPM PR interval 184 ms QRS duration 90 ms QT/QTc 404/429 ms P-R-T axes 9 57 59  FOLLOW UP PLANS AND APPOINTMENTS Allergies  Allergen Reactions  . Alendronate Sodium Anaphylaxis    Throat swells  . Ibuprofen     blisters   Medication List  As of 09/28/2011  3:41 PM   TAKE these medications         aspirin 81 MG tablet   Take 1 tablet (81 mg total) by mouth daily.      atorvastatin 40 MG tablet   Commonly known as: LIPITOR   Take 1 tablet (40 mg total) by mouth daily.      b  complex vitamins tablet   Take 1 tablet by mouth daily.      bisacodyl 5 MG EC tablet   Commonly known as: DULCOLAX   Take 5 mg by mouth daily as needed. For constipation.      diclofenac sodium 1 % Gel   Commonly known as: VOLTAREN   Apply 2 g topically 4 (four) times daily.      naproxen 375 MG tablet   Commonly known as: NAPROSYN   Take 375 mg by mouth 2 (two) times daily with a meal.      Olmesartan-Amlodipine-HCTZ 40-5-12.5 MG Tabs   Take 1 tablet by mouth daily.      Vitamin D3 1000 UNITS Caps   Take 1,000 Units by mouth daily.           Discharge Orders    Future Appointments: Provider: Department: Dept Phone: Center:   09/28/2011 1:40 PM [facility] [phone] [facility]     Follow-up Information    Follow up with Olga Millers, MD. (  The office will call)    Contact information:   [address] [place], [address] [place] [address] [phone]        BRING ALL MEDICATIONS WITH YOU TO FOLLOW UP APPOINTMENTS  Time spent with patient to include physician time: 35 min Signed: Theodore Demark 09/28/2011, 1:28 PM  Cardiology Attending Patient interviewed and examined. Discussed with Theodore Demark, PA-C.  Above note annotated and modified based upon my findings.  [place] Bing, MD 09/28/2011, 6:37 PM

## 2011-09-28 NOTE — Progress Notes (Signed)
Pt had indicated she is adamant on having the stress test done. She is requesting to have the procedure be done as an outpatient. She is ok with 2 d echo as per ordered. Pt is also wanting to go home today.  Ancil Linsey  RN

## 2011-10-02 ENCOUNTER — Encounter: Payer: Self-pay | Admitting: Cardiology

## 2011-10-02 ENCOUNTER — Ambulatory Visit (INDEPENDENT_AMBULATORY_CARE_PROVIDER_SITE_OTHER): Payer: Medicare Other | Admitting: Cardiology

## 2011-10-02 ENCOUNTER — Encounter: Payer: Self-pay | Admitting: *Deleted

## 2011-10-02 VITALS — BP 120/60 | HR 82 | Ht 62.0 in | Wt 181.0 lb

## 2011-10-02 DIAGNOSIS — Z01810 Encounter for preprocedural cardiovascular examination: Secondary | ICD-10-CM | POA: Insufficient documentation

## 2011-10-02 DIAGNOSIS — R9430 Abnormal result of cardiovascular function study, unspecified: Secondary | ICD-10-CM | POA: Diagnosis not present

## 2011-10-02 DIAGNOSIS — T148XXA Other injury of unspecified body region, initial encounter: Secondary | ICD-10-CM | POA: Diagnosis not present

## 2011-10-02 DIAGNOSIS — I2510 Atherosclerotic heart disease of native coronary artery without angina pectoris: Secondary | ICD-10-CM | POA: Insufficient documentation

## 2011-10-02 LAB — CBC
HCT: 38.8 % (ref 36.0–46.0)
Platelets: 226 10*3/uL (ref 150–400)
RBC: 4.37 MIL/uL (ref 3.87–5.11)
RDW: 13.8 % (ref 11.5–15.5)
WBC: 9 10*3/uL (ref 4.0–10.5)

## 2011-10-02 NOTE — Patient Instructions (Addendum)
Your physician has requested that you have a cardiac catheterization. Cardiac catheterization is used to diagnose and/or treat various heart conditions. Doctors may recommend this procedure for a number of different reasons. The most common reason is to evaluate chest pain. Chest pain can be a symptom of coronary artery disease (CAD), and cardiac catheterization can show whether plaque is narrowing or blocking your heart's arteries. This procedure is also used to evaluate the valves, as well as measure the blood flow and oxygen levels in different parts of your heart. For further information please visit www.[email or web address]. Please follow instruction sheet, as given.   Your physician recommends that you HAVE LAB WORK TODAY 

## 2011-10-02 NOTE — Assessment & Plan Note (Signed)
Patient presents for further evaluation prior to knee replacement. She was recently admitted and ruled out for myocardial infarction. Her nuclear study showed potential ischemia in 2 vascular territories. Given her age, abnormal nuclear study and risk factors I think it would be worthwhile to proceed with definitive evaluation preoperatively. We will arrange cardiac catheterization. The risks and benefits were discussed and the patient agrees to proceed.

## 2011-10-02 NOTE — Assessment & Plan Note (Signed)
Blood pressure controlled. Continue present medications. 

## 2011-10-02 NOTE — Progress Notes (Signed)
HPI: 78 year old female for followup of chest pain and preoperative evaluation prior to knee replacement. Patient recently seen at [facility] by Dr. Tenny Craw for epigastric pain prior to surgery. She was admitted and ruled out for myocardial infarction with serial enzymes. She had a Myoview that showed a subtle reversible defect in the apical anterior wall superimposed on a small fixed defect in the anteroseptal wall of the mid ventricle and apex. This was suggestive of prior infarct and very mild ischemia in the anterior and apex. There is also felt to be a prior infarct in the inferior lateral wall. Ejection fraction 65%. Echocardiogram in September of 2013 showed normal LV function, mild to moderate left ventricular hypertrophy, mild left atrial enlargement and mild mitral regurgitation. Patient was discharged and asked to followup with me today. Since discharge she has not had any further chest pain. She denies dyspnea, palpitations or syncope.  Current Outpatient Prescriptions  Medication Sig Dispense Refill  . aspirin 81 MG tablet Take 1 tablet (81 mg total) by mouth daily.  30 tablet    . atorvastatin (LIPITOR) 40 MG tablet Take 1 tablet (40 mg total) by mouth daily.  90 tablet  3  . b complex vitamins tablet Take 1 tablet by mouth daily.       . bisacodyl (DULCOLAX) 5 MG EC tablet Take 5 mg by mouth daily as needed. For constipation.      . Cholecalciferol (VITAMIN D3) 1000 UNITS CAPS Take 1,000 Units by mouth daily.       . diclofenac sodium (VOLTAREN) 1 % GEL Apply 2 g topically 4 (four) times daily.      . naproxen (NAPROSYN) 375 MG tablet Take 375 mg by mouth 2 (two) times daily with a meal.      . Olmesartan-Amlodipine-HCTZ (TRIBENZOR) 40-5-12.5 MG TABS Take 1 tablet by mouth daily.  90 tablet  3     Past Medical History  Diagnosis Date  . Hyperlipidemia   . Osteoporosis   . Arthritis   . Eczema   . Cataracts, bilateral   . Hypertension     primary Dr. Sanda Linger  [phone], saw Dr. Jens Som x 1 cardio    Past Surgical History  Procedure Date  . Abdominal hysterectomy   . Fracture surgery     shoulder surgery 2008  . Lipoma excision     2011    History   Social History  . Marital Status: Widowed    Spouse Name: N/A    Number of Children: N/A  . Years of Education: N/A   Occupational History  . Not on file.   Social History Main Topics  . Smoking status: Never Smoker   . Smokeless tobacco: Never Used  . Alcohol Use: No  . Drug Use: No  . Sexually Active: Not Currently   Other Topics Concern  . Not on file   Social History Narrative   Caffine drinks-YesSeat belt use-YesRegular exercise-YesSmoke alarm in home-YesGuns/firearms in home-NOHistory of physical abuse-NO    ROS: knee arthralgias but no fevers or chills, productive cough, hemoptysis, dysphasia, odynophagia, melena, hematochezia, dysuria, hematuria, rash, seizure activity, orthopnea, PND, pedal edema, claudication. Remaining systems are negative.  Physical Exam: Well-developed well-nourished in no acute distress.  Skin is warm and dry.  HEENT is normal.  Neck is supple.  Chest is clear to auscultation with normal expansion.  Cardiovascular exam is regular rate and rhythm. 2/6 systolic ejection murmur Abdominal exam nontender or distended. No masses palpated. Extremities  show no edema. neuro grossly intact

## 2011-10-02 NOTE — Assessment & Plan Note (Signed)
Continue statin. 

## 2011-10-02 NOTE — Assessment & Plan Note (Signed)
Plan cardiac catheterization as described.

## 2011-10-03 ENCOUNTER — Encounter (HOSPITAL_COMMUNITY): Payer: Self-pay | Admitting: Orthopedic Surgery

## 2011-10-03 ENCOUNTER — Telehealth: Payer: Self-pay | Admitting: Cardiology

## 2011-10-03 LAB — BASIC METABOLIC PANEL WITH GFR
Chloride: 104 mEq/L (ref 96–112)
Creat: 0.93 mg/dL (ref 0.50–1.10)
GFR, Est Non African American: 57 mL/min — ABNORMAL LOW
Sodium: 141 mEq/L (ref 135–145)

## 2011-10-03 LAB — PROTIME-INR: INR: 0.94 (ref <1.50)

## 2011-10-03 NOTE — Telephone Encounter (Signed)
New problem    Returning call back to nurse.   

## 2011-10-03 NOTE — Telephone Encounter (Signed)
Spoke with pt, aware of lab results. 

## 2011-10-04 ENCOUNTER — Other Ambulatory Visit: Payer: Self-pay | Admitting: Cardiology

## 2011-10-04 DIAGNOSIS — R9430 Abnormal result of cardiovascular function study, unspecified: Secondary | ICD-10-CM

## 2011-10-09 ENCOUNTER — Encounter (HOSPITAL_BASED_OUTPATIENT_CLINIC_OR_DEPARTMENT_OTHER)
Admission: RE | Disposition: A | Discharge status: Home / Self Care | Payer: Self-pay | Source: Ambulatory Visit | Attending: Cardiovascular Disease

## 2011-10-09 ENCOUNTER — Encounter (HOSPITAL_BASED_OUTPATIENT_CLINIC_OR_DEPARTMENT_OTHER): Payer: Self-pay

## 2011-10-09 ENCOUNTER — Inpatient Hospital Stay (HOSPITAL_BASED_OUTPATIENT_CLINIC_OR_DEPARTMENT_OTHER)
Admission: RE | Admit: 2011-10-09 | Discharge: 2011-10-09 | Disposition: A | Discharge status: Home / Self Care | Payer: Medicare Other | Source: Ambulatory Visit | Attending: Cardiovascular Disease | Admitting: Cardiovascular Disease

## 2011-10-09 DIAGNOSIS — I2510 Atherosclerotic heart disease of native coronary artery without angina pectoris: Secondary | ICD-10-CM

## 2011-10-09 DIAGNOSIS — I2582 Chronic total occlusion of coronary artery: Secondary | ICD-10-CM | POA: Insufficient documentation

## 2011-10-09 DIAGNOSIS — I10 Essential (primary) hypertension: Secondary | ICD-10-CM | POA: Insufficient documentation

## 2011-10-09 DIAGNOSIS — R9439 Abnormal result of other cardiovascular function study: Secondary | ICD-10-CM | POA: Diagnosis not present

## 2011-10-09 DIAGNOSIS — R9430 Abnormal result of cardiovascular function study, unspecified: Secondary | ICD-10-CM

## 2011-10-09 DIAGNOSIS — R079 Chest pain, unspecified: Secondary | ICD-10-CM | POA: Diagnosis not present

## 2011-10-09 SURGERY — JV LEFT HEART CATHETERIZATION WITH CORONARY ANGIOGRAM
Anesthesia: Moderate Sedation

## 2011-10-09 MED ORDER — SODIUM CHLORIDE 0.9 % IV SOLN: 1.0000 mL/kg/h | INTRAVENOUS | Status: DC

## 2011-10-09 MED ORDER — SODIUM CHLORIDE 0.9 % IJ SOLN: 3.0000 mL | Freq: Two times a day (BID) | INTRAMUSCULAR | Status: DC

## 2011-10-09 MED ORDER — SODIUM CHLORIDE 0.9 % IV SOLN
1.0000 mL/kg/h | INTRAVENOUS | Status: DC
  Administered 2011-10-09: 1 mL/kg/h via INTRAVENOUS

## 2011-10-09 MED ORDER — SODIUM CHLORIDE 0.9 % IV SOLN
250.0000 mL | INTRAVENOUS | Status: DC | PRN
Start: 2011-10-09 — End: 2011-10-09

## 2011-10-09 MED ORDER — SODIUM CHLORIDE 0.9 % IJ SOLN: 3.0000 mL | INTRAMUSCULAR | Status: DC | PRN

## 2011-10-09 MED ORDER — ASPIRIN 81 MG PO CHEW
324.0000 mg | CHEWABLE_TABLET | ORAL | Status: AC
  Administered 2011-10-09: 324 mg via ORAL

## 2011-10-09 MED ORDER — ACETAMINOPHEN 325 MG PO TABS: 650.0000 mg | ORAL_TABLET | ORAL | Status: DC | PRN

## 2011-10-09 MED ORDER — ONDANSETRON HCL 4 MG/2ML IJ SOLN: 4.0000 mg | Freq: Four times a day (QID) | INTRAMUSCULAR | Status: DC | PRN

## 2011-10-09 MED ORDER — DIAZEPAM 2 MG PO TABS: 2.0000 mg | ORAL_TABLET | ORAL | Status: DC

## 2011-10-09 NOTE — Interval H&P Note (Signed)
History and Physical Interval Note:  10/09/2011 11:00 AM  Virginia Edwards  has presented today for surgery, with the diagnosis of Abnormal Nuclear Study  The various methods of treatment have been discussed with the patient and family. After consideration of risks, benefits and other options for treatment, the patient has consented to  Procedure(s) (LRB) with comments: JV LEFT HEART CATHETERIZATION WITH CORONARY ANGIOGRAM (N/A) as a surgical intervention .  The patient's history has been reviewed, patient examined, no change in status, stable for surgery.  I have reviewed the patient's chart and labs.  Questions were answered to the patient's satisfaction.     [name]

## 2011-10-09 NOTE — CV Procedure (Signed)
   Cardiac Catheterization Procedure Note  Name: Virginia Edwards MRN: [id number] DOB: 09-30-1925  Procedure: Left Heart Cath, Selective Coronary Angiography, LV angiography  Indication: 78 year old woman who was going for elective total knee replacement. She had chest pain preoperatively and underwent a nuclear stress test demonstrating a subtle defect in the anteroapical walls suggestive of prior infarction and mild reversible ischemia as well as a moderate fixed defect in the inferolateral wall from the base to the apex consistent with prior infarction. Her gated left ventricular ejection fraction was 65%. She was referred for cardiac catheterization because of suspicion of multivariate territory ischemia and multivessel CAD.   Procedural Details: The right wrist was prepped, draped, and anesthetized with 1% lidocaine. Using the modified Seldinger technique, a 5 French sheath was introduced into the right radial artery. 3 mg of verapamil was administered through the sheath, weight-based unfractionated heparin was administered intravenously. Standard Judkins catheters were used for selective coronary angiography and left ventriculography. Catheter exchanges were performed over an exchange length guidewire. There were no immediate procedural complications. A TR band was used for radial hemostasis at the completion of the procedure.  The patient was transferred to the post catheterization recovery area for further monitoring.  Procedural Findings: Hemodynamics: AO 108/48 with a mean of 73 LV 108/10  Coronary angiography: Coronary dominance: right  Left mainstem: Mildly calcified, mild ostial stenosis of 20%.  Left anterior descending (LAD): Moderately calcified. The proximal vessel is diffusely diseased with sequential 50% stenoses. The first diagonal arises from an attack area of LAD and there is diffuse 70% stenosis noted in this moderate caliber vessel. The second diagonal is small in caliber with  80% proximal vessel stenosis. The mid LAD after the second septal perforator has tight stenosis of 80% in the distal LAD is patent to the distal anterior wall. The LAD terminates in the distal anterior wall and does not wrap around the left ventricular apex. The septal perforating branches are large and supply collaterals to the distal right coronary artery circulation.  Left circumflex (LCx): The left circumflex is severely diseased. The proximal circumflex has diffuse 75% stenosis and is totally occluded after the first OM. The first OM is very small in caliber. The second OM is totally occluded and fills from left to left collaterals.  Right coronary artery (RCA): The right coronary artery is heavily calcified. The vessel is totally occluded in the midportion and the PDA and PLA branches fill from left to right collaterals.  Left ventriculography: Left ventricular systolic function is normal, LVEF is estimated at 55-60%, there is no significant mitral regurgitation   Final Conclusions:   1. Severe three-vessel coronary artery disease with total occlusion of the right coronary artery, total occlusion of the left circumflex, and severe stenosis of the LAD. 2. Preserved LV function.  Recommendations: I reviewed the results with Dr. Jens Som. If this patient requires revascularization, coronary bypass surgery as her only option. Risks and benefits of medical therapy versus CABG in this 78 year old patient will be reviewed with her as an outpatient. I think she would be at high risk of adverse cardiac events if she were to go through total knee surgery.  [name] 10/09/2011, 11:37 AM

## 2011-10-09 NOTE — Progress Notes (Signed)
Dr. Excell Seltzer in to discuss results with patient and daughter.

## 2011-10-09 NOTE — Progress Notes (Signed)
TR band removed, site level 0.  Tegaderm dressing and right wrist immobilizer applied to right wrist.

## 2011-10-09 NOTE — H&P (View-Only) (Signed)
 HPI: 78-year-old female for followup of chest pain and preoperative evaluation prior to knee replacement. Patient recently seen at [facility] by Dr. Ross for epigastric pain prior to surgery. She was admitted and ruled out for myocardial infarction with serial enzymes. She had a Myoview that showed a subtle reversible defect in the apical anterior wall superimposed on a small fixed defect in the anteroseptal wall of the mid ventricle and apex. This was suggestive of prior infarct and very mild ischemia in the anterior and apex. There is also felt to be a prior infarct in the inferior lateral wall. Ejection fraction 65%. Echocardiogram in September of 2013 showed normal LV function, mild to moderate left ventricular hypertrophy, mild left atrial enlargement and mild mitral regurgitation. Patient was discharged and asked to followup with me today. Since discharge she has not had any further chest pain. She denies dyspnea, palpitations or syncope.  Current Outpatient Prescriptions  Medication Sig Dispense Refill  . aspirin 81 MG tablet Take 1 tablet (81 mg total) by mouth daily.  30 tablet    . atorvastatin (LIPITOR) 40 MG tablet Take 1 tablet (40 mg total) by mouth daily.  90 tablet  3  . b complex vitamins tablet Take 1 tablet by mouth daily.       . bisacodyl (DULCOLAX) 5 MG EC tablet Take 5 mg by mouth daily as needed. For constipation.      . Cholecalciferol (VITAMIN D3) 1000 UNITS CAPS Take 1,000 Units by mouth daily.       . diclofenac sodium (VOLTAREN) 1 % GEL Apply 2 g topically 4 (four) times daily.      . naproxen (NAPROSYN) 375 MG tablet Take 375 mg by mouth 2 (two) times daily with a meal.      . Olmesartan-Amlodipine-HCTZ (TRIBENZOR) 40-5-12.5 MG TABS Take 1 tablet by mouth daily.  90 tablet  3     Past Medical History  Diagnosis Date  . Hyperlipidemia   . Osteoporosis   . Arthritis   . Eczema   . Cataracts, bilateral   . Hypertension     primary Dr. Thomas Jones  [phone], saw Dr. Sheera Illingworth x 1 cardio    Past Surgical History  Procedure Date  . Abdominal hysterectomy   . Fracture surgery     shoulder surgery 2008  . Lipoma excision     2011    History   Social History  . Marital Status: Widowed    Spouse Name: N/A    Number of Children: N/A  . Years of Education: N/A   Occupational History  . Not on file.   Social History Main Topics  . Smoking status: Never Smoker   . Smokeless tobacco: Never Used  . Alcohol Use: No  . Drug Use: No  . Sexually Active: Not Currently   Other Topics Concern  . Not on file   Social History Narrative   Caffine drinks-YesSeat belt use-YesRegular exercise-YesSmoke alarm in home-YesGuns/firearms in home-NOHistory of physical abuse-NO    ROS: knee arthralgias but no fevers or chills, productive cough, hemoptysis, dysphasia, odynophagia, melena, hematochezia, dysuria, hematuria, rash, seizure activity, orthopnea, PND, pedal edema, claudication. Remaining systems are negative.  Physical Exam: Well-developed well-nourished in no acute distress.  Skin is warm and dry.  HEENT is normal.  Neck is supple.  Chest is clear to auscultation with normal expansion.  Cardiovascular exam is regular rate and rhythm. 2/6 systolic ejection murmur Abdominal exam nontender or distended. No masses palpated. Extremities   show no edema. neuro grossly intact      

## 2011-10-09 NOTE — Progress Notes (Signed)
Allen's test performed on right hand with normal results, spo2 96 %.

## 2011-10-17 ENCOUNTER — Encounter: Payer: Medicare Other | Admitting: Cardiology

## 2011-10-23 ENCOUNTER — Encounter: Payer: Self-pay | Admitting: Internal Medicine

## 2011-10-23 ENCOUNTER — Ambulatory Visit (INDEPENDENT_AMBULATORY_CARE_PROVIDER_SITE_OTHER): Payer: Medicare Other | Admitting: Internal Medicine

## 2011-10-23 VITALS — BP 122/80 | HR 81 | Temp 97.2°F | Resp 16 | Wt 180.1 lb

## 2011-10-23 DIAGNOSIS — I2510 Atherosclerotic heart disease of native coronary artery without angina pectoris: Secondary | ICD-10-CM

## 2011-10-23 DIAGNOSIS — Z23 Encounter for immunization: Secondary | ICD-10-CM

## 2011-10-23 DIAGNOSIS — M1710 Unilateral primary osteoarthritis, unspecified knee: Secondary | ICD-10-CM

## 2011-10-23 DIAGNOSIS — I10 Essential (primary) hypertension: Secondary | ICD-10-CM

## 2011-10-23 MED ORDER — TRAMADOL HCL 50 MG PO TABS: 50.0000 mg | ORAL_TABLET | Freq: Three times a day (TID) | ORAL | Status: DC | PRN

## 2011-10-23 NOTE — Assessment & Plan Note (Signed)
She tells me that she needs a CABG but is too old for the surgery, she will see Dr. Jens Som soon for f/up

## 2011-10-23 NOTE — Assessment & Plan Note (Signed)
Her BP is well controlled 

## 2011-10-23 NOTE — Patient Instructions (Signed)
Hypertension As your heart beats, it forces blood through your arteries. This force is your blood pressure. If the pressure is too high, it is called hypertension (HTN) or high blood pressure. HTN is dangerous because you may have it and not know it. High blood pressure may mean that your heart has to work harder to pump blood. Your arteries may be narrow or stiff. The extra work puts you at risk for heart disease, stroke, and other problems.  Blood pressure consists of two numbers, a higher number over a lower, 110/72, for example. It is stated as "110 over 72." The ideal is below 120 for the top number (systolic) and under 80 for the bottom (diastolic). Write down your blood pressure today. You should pay close attention to your blood pressure if you have certain conditions such as:  Heart failure.  Prior heart attack.  Diabetes  Chronic kidney disease.  Prior stroke.  Multiple risk factors for heart disease. To see if you have HTN, your blood pressure should be measured while you are seated with your arm held at the level of the heart. It should be measured at least twice. A one-time elevated blood pressure reading (especially in the Emergency Department) does not mean that you need treatment. There may be conditions in which the blood pressure is different between your right and left arms. It is important to see your caregiver soon for a recheck. Most people have essential hypertension which means that there is not a specific cause. This type of high blood pressure may be lowered by changing lifestyle factors such as:  Stress.  Smoking.  Lack of exercise.  Excessive weight.  Drug/tobacco/alcohol use.  Eating less salt. Most people do not have symptoms from high blood pressure until it has caused damage to the body. Effective treatment can often prevent, delay or reduce that damage. TREATMENT  When a cause has been identified, treatment for high blood pressure is directed at the  cause. There are a large number of medications to treat HTN. These fall into several categories, and your caregiver will help you select the medicines that are best for you. Medications may have side effects. You should review side effects with your caregiver. If your blood pressure stays high after you have made lifestyle changes or started on medicines,   Your medication(s) may need to be changed.  Other problems may need to be addressed.  Be certain you understand your prescriptions, and know how and when to take your medicine.  Be sure to follow up with your caregiver within the time frame advised (usually within two weeks) to have your blood pressure rechecked and to review your medications.  If you are taking more than one medicine to lower your blood pressure, make sure you know how and at what times they should be taken. Taking two medicines at the same time can result in blood pressure that is too low. SEEK IMMEDIATE MEDICAL CARE IF:  You develop a severe headache, blurred or changing vision, or confusion.  You have unusual weakness or numbness, or a faint feeling.  You have severe chest or abdominal pain, vomiting, or breathing problems. MAKE SURE YOU:   Understand these instructions.  Will watch your condition.  Will get help right away if you are not doing well or get worse. Document Released: 01/07/2005 Document Revised: 04/01/2011 Document Reviewed: 08/28/2007 [facility] Patient Information 2013 [place], [place]. Osteoarthritis Osteoarthritis is the most common form of arthritis. It is redness, soreness, and swelling (inflammation)  affecting the cartilage. Cartilage acts as a cushion, covering the ends of bones where they meet to form a joint. CAUSES  Over time, the cartilage begins to wear away. This causes bone to rub on bone. This produces pain and stiffness in the affected joints. Factors that contribute to this problem are:  Excessive body weight.  Age.  Overuse  of joints. SYMPTOMS   People with osteoarthritis usually experience joint pain, swelling, or stiffness.  Over time, the joint may lose its normal shape.  Small deposits of bone (osteophytes) may grow on the edges of the joint.  Bits of bone or cartilage can break off and float inside the joint space. This may cause more pain and damage.  Osteoarthritis can lead to depression, anxiety, feelings of helplessness, and limitations on daily activities. The most commonly affected joints are in the:  Ends of the fingers.  Thumbs.  Neck.  Lower back.  Knees.  Hips. DIAGNOSIS  Diagnosis is mostly based on your symptoms and exam. Tests may be helpful, including:  X-rays of the affected joint.  A computerized magnetic scan (MRI).  Blood tests to rule out other types of arthritis.  Joint fluid tests. This involves using a needle to draw fluid from the joint and examining the fluid under a microscope. TREATMENT  Goals of treatment are to control pain, improve joint function, maintain a normal body weight, and maintain a healthy lifestyle. Treatment approaches may include:  A prescribed exercise program with rest and joint relief.  Weight control with nutritional education.  Pain relief techniques such as:  Properly applied heat and cold.  Electric pulses delivered to nerve endings under the skin (transcutaneous electrical nerve stimulation, TENS).  Massage.  Certain supplements. Ask your caregiver before using any supplements, especially in combination with prescribed drugs.  Medicines to control pain, such as:  Acetaminophen.  Nonsteroidal anti-inflammatory drugs (NSAIDs), such as naproxen.  Narcotic or central-acting agents, such as tramadol. This drug carries a risk of addiction and is generally prescribed for short-term use.  Corticosteroids. These can be given orally or as injection. This is a short-term treatment, not recommended for routine use.  Surgery to  reposition the bones and relieve pain (osteotomy) or to remove loose pieces of bone and cartilage. Joint replacement may be needed in advanced states of osteoarthritis. HOME CARE INSTRUCTIONS  Your caregiver can recommend specific types of exercise. These may include:  Strengthening exercises. These are done to strengthen the muscles that support joints affected by arthritis. They can be performed with weights or with exercise bands to add resistance.  Aerobic activities. These are exercises, such as brisk walking or low-impact aerobics, that get your heart pumping. They can help keep your lungs and circulatory system in shape.  Range-of-motion activities. These keep your joints limber.  Balance and agility exercises. These help you maintain daily living skills. Learning about your condition and being actively involved in your care will help improve the course of your osteoarthritis. SEEK MEDICAL CARE IF:   You feel hot or your skin turns red.  You develop a rash in addition to your joint pain.  You have an oral temperature above 102 F (38.9 C). FOR MORE INFORMATION  National Institute of Arthritis and Musculoskeletal and Skin Diseases: www.niams.http://www.myers.net/ [employer] on Aging: https://walker.com/ American College of Rheumatology: www.rheumatology.org Document Released: 01/07/2005 Document Revised: 04/01/2011 Document Reviewed: 04/20/2009 [facility] Patient Information 2013 [place], [place].

## 2011-10-23 NOTE — Progress Notes (Signed)
Subjective:    Patient ID: Virginia Edwards, female    DOB: 12/17/1925, 78 y.o.   MRN: [id number]  Hypertension This is a chronic problem. The current episode started more than 1 year ago. The problem is controlled. Pertinent negatives include no anxiety, blurred vision, chest pain, headaches, malaise/fatigue, neck pain, orthopnea, palpitations, peripheral edema, PND, shortness of breath or sweats. Agents associated with hypertension include NSAIDs. Past treatments include angiotensin blockers, calcium channel blockers and diuretics. The current treatment provides mild improvement. Compliance problems include exercise and diet.  Hypertensive end-organ damage includes CAD/MI.      Review of Systems  Constitutional: Negative for fever, chills, malaise/fatigue, diaphoresis, activity change, appetite change, fatigue and unexpected weight change.  HENT: Negative.  Negative for neck pain.   Eyes: Negative.  Negative for blurred vision.  Respiratory: Negative for cough, chest tightness, shortness of breath, wheezing and stridor.   Cardiovascular: Negative for chest pain, palpitations, orthopnea, leg swelling and PND.  Gastrointestinal: Negative for nausea, vomiting, abdominal pain, diarrhea, constipation and blood in stool.  Genitourinary: Negative.   Musculoskeletal: Positive for arthralgias (both knees). Negative for myalgias, back pain, joint swelling and gait problem.  Skin: Negative.   Neurological: Negative.  Negative for headaches.  Hematological: Negative for adenopathy. Does not bruise/bleed easily.  Psychiatric/Behavioral: Negative.        Objective:   Physical Exam  Vitals reviewed. Constitutional: She is oriented to person, place, and time. She appears well-developed and well-nourished. No distress.  HENT:  Head: Normocephalic and atraumatic.  Mouth/Throat: Oropharynx is clear and moist. No oropharyngeal exudate.  Eyes: Conjunctivae normal are normal. Right eye exhibits no discharge.  Left eye exhibits no discharge. No scleral icterus.  Neck: Normal range of motion. Neck supple. No JVD present. No tracheal deviation present. No thyromegaly present.  Cardiovascular: Normal rate, regular rhythm, normal heart sounds and intact distal pulses.  Exam reveals no gallop and no friction rub.   No murmur heard. Pulmonary/Chest: Effort normal and breath sounds normal. No stridor. No respiratory distress. She has no wheezes. She has no rales. She exhibits no tenderness.  Abdominal: Soft. Bowel sounds are normal. She exhibits no distension and no mass. There is no tenderness. There is no rebound and no guarding.  Musculoskeletal: She exhibits no edema and no tenderness.       Right knee: She exhibits decreased range of motion and deformity (djd changes). She exhibits no swelling, no effusion and no erythema. no tenderness found.       Left knee: She exhibits decreased range of motion and deformity (djd changes). She exhibits no swelling, no effusion and no erythema. no tenderness found.  Lymphadenopathy:    She has no cervical adenopathy.  Neurological: She is oriented to person, place, and time.  Skin: Skin is warm and dry. No rash noted. She is not diaphoretic. No erythema. No pallor.  Psychiatric: She has a normal mood and affect. Her behavior is normal. Judgment and thought content normal.     Lab Results  Component Value Date   WBC 9.0 10/02/2011   HGB 12.8 10/02/2011   HCT 38.8 10/02/2011   PLT 226 10/02/2011   GLUCOSE 86 10/02/2011   CHOL 177 08/08/2011   TRIG 164.0* 08/08/2011   HDL 53.60 08/08/2011   LDLDIRECT 201.5 07/05/2010   LDLCALC 91 08/08/2011   ALT 16 09/17/2011   AST 21 09/17/2011   NA 141 10/02/2011   K 4.2 10/02/2011   CL 104 10/02/2011   CREATININE 0.93 10/02/2011  BUN 22 10/02/2011   CO2 27 10/02/2011   TSH 2.87 06/20/2011   INR 0.94 10/02/2011   HGBA1C 6.0 08/08/2011       Assessment & Plan:

## 2011-10-23 NOTE — Assessment & Plan Note (Signed)
Will add tramadol for additional relief of her pain

## 2011-10-30 ENCOUNTER — Ambulatory Visit (INDEPENDENT_AMBULATORY_CARE_PROVIDER_SITE_OTHER): Payer: Medicare Other | Admitting: Cardiology

## 2011-10-30 ENCOUNTER — Encounter: Payer: Self-pay | Admitting: Cardiology

## 2011-10-30 VITALS — BP 171/82 | HR 84 | Wt 180.0 lb

## 2011-10-30 DIAGNOSIS — E782 Mixed hyperlipidemia: Secondary | ICD-10-CM

## 2011-10-30 DIAGNOSIS — I2510 Atherosclerotic heart disease of native coronary artery without angina pectoris: Secondary | ICD-10-CM | POA: Diagnosis not present

## 2011-10-30 DIAGNOSIS — Z01810 Encounter for preprocedural cardiovascular examination: Secondary | ICD-10-CM | POA: Diagnosis not present

## 2011-10-30 DIAGNOSIS — I10 Essential (primary) hypertension: Secondary | ICD-10-CM | POA: Diagnosis not present

## 2011-10-30 NOTE — Assessment & Plan Note (Signed)
Patient noted to have severe coronary disease on recent cardiac catheterization with preserved LV function. Based on anatomy she is not a candidate for PCI. She has considered coronary artery bypass and graft versus medical therapy. Given her age she would be high risk for surgery. She would prefer conservative measures. Plan continue aspirin and statin. She is not having chest pain.

## 2011-10-30 NOTE — Progress Notes (Signed)
HPI: Pleasant female for followup of CAD. Patient recently had a Myoview preop prior to knee surgery that showed a subtle reversible defect in the apical anterior wall superimposed on a small fixed defect in the anteroseptal wall of the mid ventricle and apex. This was suggestive of prior infarct and very mild ischemia in the anterior and apex. There is also felt to be a prior infarct in the inferior lateral wall. Ejection fraction 65%. Echocardiogram in September of 2013 showed normal LV function, mild to moderate left ventricular hypertrophy, mild left atrial enlargement and mild mitral regurgitation. Patient then had cardiac cath in Sept 2013. This revealed a 20% left main. The LAD has a proximal 50% lesion and there is diffuse 70% stenosis in the first diagonal. The second diagonal had an 80% proximal lesion. The mid LAD had an 80% lesion. The septals were large and supplied collaterals to the right coronary artery. The circumflex was severely diseased with a proximal diffuse 75% lesion and a totally occluded after the first marginal. The second marginal was totally occluded and filled via left to left collaterals. The right coronary was totally occluded. Ejection fraction was normal. PCI was not an option. CABG to be considered the patient 78 years old. She has been treated medically thus far. It was also felt anatomy would be high risk for knee surgery. Since her catheterization, she denies dyspnea, chest pain, palpitations or syncope. She does have limited mobility because of knee pain.     Current Outpatient Prescriptions  Medication Sig Dispense Refill  . aspirin 81 MG tablet Take 1 tablet (81 mg total) by mouth daily.  30 tablet    . atorvastatin (LIPITOR) 40 MG tablet Take 1 tablet (40 mg total) by mouth daily.  90 tablet  3  . b complex vitamins tablet Take 1 tablet by mouth daily.       . bisacodyl (DULCOLAX) 5 MG EC tablet Take 5 mg by mouth daily as needed. For constipation.      .  Cholecalciferol (VITAMIN D3) 1000 UNITS CAPS Take 1,000 Units by mouth daily.       . diclofenac sodium (VOLTAREN) 1 % GEL Apply 2 g topically 4 (four) times daily.      . naproxen (NAPROSYN) 375 MG tablet Take 375 mg by mouth 2 (two) times daily with a meal.      . Olmesartan-Amlodipine-HCTZ (TRIBENZOR) 40-5-12.5 MG TABS Take 1 tablet by mouth daily.  90 tablet  3  . traMADol (ULTRAM) 50 MG tablet Take 1 tablet (50 mg total) by mouth every 8 (eight) hours as needed for pain.  75 tablet  11     Past Medical History  Diagnosis Date  . Hyperlipidemia   . Osteoporosis   . Arthritis   . Eczema   . Cataracts, bilateral   . Hypertension     primary Dr. Sanda Linger [phone], saw Dr. Jens Som x 1 cardio  . CAD (coronary artery disease)     Past Surgical History  Procedure Date  . Abdominal hysterectomy   . Fracture surgery     shoulder surgery 2008  . Lipoma excision     2011  . Total knee arthroplasty 09/27/2011    Procedure: TOTAL KNEE ARTHROPLASTY;  Surgeon: Thera Flake., MD;  Location: [facility] OR;  Service: Orthopedics;  Laterality: Right;  right total knee arthroplasty    History   Social History  . Marital Status: Widowed    Spouse Name: N/A  Number of Children: N/A  . Years of Education: N/A   Occupational History  . Not on file.   Social History Main Topics  . Smoking status: Never Smoker   . Smokeless tobacco: Never Used  . Alcohol Use: No  . Drug Use: No  . Sexually Active: Not Currently   Other Topics Concern  . Not on file   Social History Narrative   Caffine drinks-YesSeat belt use-YesRegular exercise-YesSmoke alarm in home-YesGuns/firearms in home-NOHistory of physical abuse-NO    ROS: knee arthralgias but no fevers or chills, productive cough, hemoptysis, dysphasia, odynophagia, melena, hematochezia, dysuria, hematuria, rash, seizure activity, orthopnea, PND, pedal edema, claudication. Remaining systems are negative.  Physical Exam: Well-developed  well-nourished in no acute distress.  Skin is warm and dry.  HEENT is normal.  Neck is supple.  Chest is clear to auscultation with normal expansion.  Cardiovascular exam is regular rate and rhythm.  Abdominal exam nontender or distended. No masses palpated. Extremities show no edema. neuro grossly intact

## 2011-10-30 NOTE — Assessment & Plan Note (Signed)
Continue statin. 

## 2011-10-30 NOTE — Assessment & Plan Note (Signed)
Given severity of coronary artery disease she will be high risk for any procedure including knee replacement. She therefore will not pursue this.

## 2011-10-30 NOTE — Assessment & Plan Note (Signed)
Blood pressure is elevated. However she check this routinely at home and states her systolic typically runs 120. She will follow this and we will add additional medications if needed.

## 2011-10-30 NOTE — Patient Instructions (Signed)
Your physician wants you to follow-up in: 6 MONTHS WITH DR. CRENSHAW. You will receive a reminder letter in the mail two months in advance. If you don't receive a letter, please call our office to schedule the follow-up appointment.   Your physician recommends that you continue on your current medications as directed. Please refer to the Current Medication list given to you today.   

## 2011-12-11 ENCOUNTER — Other Ambulatory Visit: Payer: Self-pay | Admitting: Internal Medicine

## 2011-12-23 ENCOUNTER — Other Ambulatory Visit: Payer: Self-pay | Admitting: Internal Medicine

## 2011-12-31 ENCOUNTER — Telehealth: Payer: Self-pay | Admitting: Internal Medicine

## 2011-12-31 DIAGNOSIS — M1710 Unilateral primary osteoarthritis, unspecified knee: Secondary | ICD-10-CM

## 2011-12-31 NOTE — Telephone Encounter (Signed)
Caller: Kariya/Patient; Phone: [phone]; Reason for Call: Patient calling about pain medication.  States not able to take tramedol.  Also takes naproxyn BID, but states she has been diagnosed with a heart condition, and feels like she should not take naproxyn without Dr.  Yetta Barre' approval.  States she is not able to tolerate tramedol because of constipation.  Uses Voltaren gel but needs more than that due to knee pain and difficulty walking.  Would like Dr.  Yetta Barre to consider something else.  Info to office for provider review/Rx/callback.   May reach patient [phone].

## 2012-01-01 MED ORDER — HYDROCODONE-ACETAMINOPHEN 5-325 MG PO TABS: 1.0000 | ORAL_TABLET | Freq: Four times a day (QID) | ORAL | Status: DC | PRN

## 2012-01-01 NOTE — Telephone Encounter (Signed)
Pt informed of MD's advisement and of new rx for Vicodin.

## 2012-01-01 NOTE — Telephone Encounter (Signed)
Left message for pt to callback office.  

## 2012-01-01 NOTE — Telephone Encounter (Signed)
Stop naprosyn Stop tramadol Continue voltaren gel Start vicodin (see new Rx)

## 2012-01-13 ENCOUNTER — Other Ambulatory Visit: Payer: Self-pay | Admitting: Internal Medicine

## 2012-01-17 ENCOUNTER — Telehealth: Payer: Self-pay | Admitting: Internal Medicine

## 2012-01-17 NOTE — Telephone Encounter (Signed)
Patient Information:  Caller Name: [place]  Phone: [phone]  Patient: Virginia Edwards, Virginia Edwards  Gender: Female  DOB: [birth year]  Age: 78 Years  PCP: Sanda Linger (Adults only)  Office Follow Up:  Does the office need to follow up with this patient?: Yes  Instructions For The Office: PATIENT IS ASKING DR. Yetta Barre WHAT MED SHE CAN TAKE ALONG WITH PAIN MED (HYDROCODONE ) TO PREVENT CONSTIPATION.  RN Note:  Will forward MED QUESTION TO DR. Yetta Barre. PATIENT ASKING WHAT MED SHE CAN TAKE ALONG WITH PAIN MED TO PREVENT CONSTIPATION SYMPTOMS.  Symptoms  Reason For Call & Symptoms: Caller with constipation since taking hydrocodone . Onset: 01/15/12.  denies abd. pain, nausea or vomiting or blood in stool. Denies fever. Caller is taking hydrocodone for arthritic knees.  Caller has discontinued the Hydrocodone since 01/15/12 due to constipation issues and picked up script from pharmacy today (12/27)for Tramadol to start taking this pm ( 12/27).  Caller feels this med has less constipation symptoms.  Caller is asking what med. Dr. Yetta Barre would recommend for patient to take along with the Hydrocodone that would prevent patient from getting to the point of becoming constipated.  States she takes a daily stool softner but that is "not enough".  Reviewed Health History In EMR: Yes  Reviewed Medications In EMR: Yes  Reviewed Allergies In EMR: Yes  Reviewed Surgeries / Procedures: Yes  Date of Onset of Symptoms: 01/15/2012  Treatments Tried: Ducolax supp. taken 01/15/12 with good relief.  Treatments Tried Worked: Yes  Guideline(s) Used:  Constipation  Disposition Per Guideline:   Home Care  Reason For Disposition Reached:   Mild constipation  Advice Given:  General Constipation Instructions:  Eat a high fiber diet.  Drink adequate liquids.  High Fiber Diet:  Try to eat fresh fruit and vegetables at each meal (peas, prunes, citrus, apples, beans, corn).  Eat more grain foods (bran flakes, bran muffins,  graham crackers, oatmeal, brown rice, and whole wheat bread). Popcorn is a source of fiber.  Liquids:  Drink 6-8 glasses of water a day (Caution: certain medical conditions require fluid restriction).  Prune juice is a natural laxative.  Osmotic Laxatives:  Miralax (polyethylene glycol 3350): Miralax is an "osmotic" agent which means that it binds water and causes water to be retained within the stool. You can use this laxative to treat occasional constipation. Do not use for more than 2 weeks without approval from your doctor. Generally, Miralax produces a bowel movement in 1 to 3 days. Side effects include diarrhea (especially at higher doses). If you are pregnant, discuss with your doctor before using. Available in the [place].  Call Back If:  Constipation continues (i.e., less than 3 BMs / week or straining more than 25% of the time) after following care advice for constipation for 2 weeks  You become worse

## 2012-01-20 NOTE — Telephone Encounter (Signed)
Pt informed of MD's advisement. 

## 2012-01-20 NOTE — Telephone Encounter (Signed)
miralax - otc

## 2012-01-28 ENCOUNTER — Telehealth: Payer: Self-pay | Admitting: Internal Medicine

## 2012-01-28 DIAGNOSIS — M1710 Unilateral primary osteoarthritis, unspecified knee: Secondary | ICD-10-CM

## 2012-01-28 NOTE — Telephone Encounter (Signed)
Patient is calling about medicaiton for OA in knee.  Pt states she is treated by Dr Yetta Barre for this problem.  Pt is currently taking the tramadol for the pain but it is not helping her.  Pt states she is not taking the Vicodin any more.  Pt wanted to know if Dr Yetta Barre would call in new medication, Celebrex, for her and she could take that medication along with the tramadol.  OFFICE PLEASE FOLLOW UP WITH PATIENT REGARDING NEW RX OF CELEBREX.  PT USES [facility] OFF [address] [phone]

## 2012-01-29 ENCOUNTER — Telehealth: Payer: Self-pay | Admitting: Internal Medicine

## 2012-01-29 MED ORDER — CELECOXIB 200 MG PO CAPS: 200.0000 mg | ORAL_CAPSULE | Freq: Two times a day (BID) | ORAL | Status: DC

## 2012-01-29 NOTE — Telephone Encounter (Signed)
Left message for pt to callback office.  

## 2012-01-29 NOTE — Telephone Encounter (Signed)
Caller: Kya/Patient; Phone: [phone]; Reason for Call: Returning call to office staff/Ashley who left message to call on 01/29/12.  Please call her back.

## 2012-01-29 NOTE — Telephone Encounter (Signed)
Yes, you can add celebrex to your current med for pain

## 2012-01-30 NOTE — Telephone Encounter (Signed)
Pt informed of MD's advisement and of new rx for Celebrex.

## 2012-01-30 NOTE — Telephone Encounter (Signed)
Pt informed of MD's advisement (See previous phone note).

## 2012-02-24 ENCOUNTER — Other Ambulatory Visit: Payer: Self-pay | Admitting: Internal Medicine

## 2012-03-24 ENCOUNTER — Ambulatory Visit: Payer: Medicare Other | Admitting: Internal Medicine

## 2012-03-31 ENCOUNTER — Ambulatory Visit (INDEPENDENT_AMBULATORY_CARE_PROVIDER_SITE_OTHER): Payer: Medicare Other | Admitting: Internal Medicine

## 2012-03-31 ENCOUNTER — Encounter: Payer: Self-pay | Admitting: Internal Medicine

## 2012-03-31 ENCOUNTER — Other Ambulatory Visit (INDEPENDENT_AMBULATORY_CARE_PROVIDER_SITE_OTHER): Payer: Medicare Other

## 2012-03-31 VITALS — BP 138/74 | HR 68 | Temp 98.0°F | Resp 16 | Wt 168.0 lb

## 2012-03-31 DIAGNOSIS — M1710 Unilateral primary osteoarthritis, unspecified knee: Secondary | ICD-10-CM | POA: Diagnosis not present

## 2012-03-31 DIAGNOSIS — I10 Essential (primary) hypertension: Secondary | ICD-10-CM

## 2012-03-31 DIAGNOSIS — E782 Mixed hyperlipidemia: Secondary | ICD-10-CM

## 2012-03-31 LAB — LIPID PANEL
Cholesterol: 173 mg/dL (ref 0–200)
VLDL: 20.8 mg/dL (ref 0.0–40.0)

## 2012-03-31 LAB — COMPREHENSIVE METABOLIC PANEL
Albumin: 3.4 g/dL — ABNORMAL LOW (ref 3.5–5.2)
Alkaline Phosphatase: 54 U/L (ref 39–117)
BUN: 20 mg/dL (ref 6–23)
CO2: 28 mEq/L (ref 19–32)
Calcium: 9.8 mg/dL (ref 8.4–10.5)
Chloride: 103 mEq/L (ref 96–112)
Glucose, Bld: 94 mg/dL (ref 70–99)
Potassium: 3.6 mEq/L (ref 3.5–5.1)
Sodium: 138 mEq/L (ref 135–145)
Total Protein: 6.6 g/dL (ref 6.0–8.3)

## 2012-03-31 LAB — HEMOGLOBIN A1C: Hgb A1c MFr Bld: 6 % (ref 4.6–6.5)

## 2012-03-31 LAB — CK: Total CK: 99 U/L (ref 7–177)

## 2012-03-31 MED ORDER — TRAMADOL HCL 50 MG PO TABS: 50.0000 mg | ORAL_TABLET | Freq: Three times a day (TID) | ORAL | Status: DC | PRN

## 2012-03-31 NOTE — Assessment & Plan Note (Signed)
Continue tramadol for pain

## 2012-03-31 NOTE — Progress Notes (Signed)
Subjective:    Patient ID: Virginia Edwards, female    DOB: 1925/07/15, 79 y.o.   MRN: [id number]  Hypertension This is a chronic problem. The current episode started more than 1 year ago. The problem is unchanged. The problem is controlled. Pertinent negatives include no anxiety, blurred vision, chest pain, headaches, malaise/fatigue, neck pain, orthopnea, palpitations, peripheral edema, PND, shortness of breath or sweats. Agents associated with hypertension include NSAIDs. Past treatments include calcium channel blockers, angiotensin blockers and diuretics. The current treatment provides moderate improvement. Compliance problems include exercise and diet.  Hypertensive end-organ damage includes CAD/MI.      Review of Systems  Constitutional: Negative for fever, chills, malaise/fatigue, diaphoresis, activity change, appetite change, fatigue and unexpected weight change.  HENT: Negative.  Negative for neck pain.   Eyes: Negative.  Negative for blurred vision.  Respiratory: Negative for cough, choking, chest tightness, shortness of breath, wheezing and stridor.   Cardiovascular: Negative for chest pain, palpitations, orthopnea, leg swelling and PND.  Gastrointestinal: Negative for nausea, vomiting, abdominal pain, diarrhea, constipation and blood in stool.  Endocrine: Negative.   Genitourinary: Negative.   Musculoskeletal: Negative for myalgias, back pain, joint swelling and gait problem.  Skin: Negative.  Negative for color change, pallor, rash and wound.  Allergic/Immunologic: Negative.   Neurological: Negative.  Negative for dizziness, tremors, weakness, light-headedness and headaches.  Hematological: Negative for adenopathy. Does not bruise/bleed easily.  Psychiatric/Behavioral: Negative.        Objective:   Physical Exam  Vitals reviewed. Constitutional: She is oriented to person, place, and time. She appears well-developed and well-nourished. No distress.  HENT:  Head: Normocephalic  and atraumatic.  Mouth/Throat: Oropharynx is clear and moist. No oropharyngeal exudate.  Eyes: Conjunctivae are normal. Right eye exhibits no discharge. Left eye exhibits no discharge. No scleral icterus.  Neck: Normal range of motion. Neck supple. No JVD present. No tracheal deviation present. No thyromegaly present.  Cardiovascular: Normal rate, regular rhythm, normal heart sounds and intact distal pulses.  Exam reveals no gallop and no friction rub.   No murmur heard. Pulmonary/Chest: Effort normal and breath sounds normal. No stridor. No respiratory distress. She has no wheezes. She has no rales. She exhibits no tenderness.  Abdominal: Soft. Bowel sounds are normal. She exhibits no distension and no mass. There is no tenderness. There is no rebound and no guarding.  Musculoskeletal: Normal range of motion. She exhibits edema (trace edema in BLE). She exhibits no tenderness.  Lymphadenopathy:    She has no cervical adenopathy.  Neurological: She is oriented to person, place, and time.  Skin: Skin is warm and dry. No rash noted. She is not diaphoretic. No erythema. No pallor.  Psychiatric: She has a normal mood and affect. Her behavior is normal. Judgment and thought content normal.     Lab Results  Component Value Date   WBC 9.0 10/02/2011   HGB 12.8 10/02/2011   HCT 38.8 10/02/2011   PLT 226 10/02/2011   GLUCOSE 86 10/02/2011   CHOL 177 08/08/2011   TRIG 164.0* 08/08/2011   HDL 53.60 08/08/2011   LDLDIRECT 201.5 07/05/2010   LDLCALC 91 08/08/2011   ALT 16 09/17/2011   AST 21 09/17/2011   NA 141 10/02/2011   K 4.2 10/02/2011   CL 104 10/02/2011   CREATININE 0.93 10/02/2011   BUN 22 10/02/2011   CO2 27 10/02/2011   TSH 2.87 06/20/2011   INR 0.94 10/02/2011   HGBA1C 6.0 08/08/2011       Assessment &  Plan:

## 2012-03-31 NOTE — Assessment & Plan Note (Signed)
Her BP is well controlled Today I will check her lytes and renal function 

## 2012-03-31 NOTE — Assessment & Plan Note (Signed)
She is doing well on lipitor 

## 2012-03-31 NOTE — Assessment & Plan Note (Signed)
I will check her A1C to see if she has developed DM2 

## 2012-03-31 NOTE — Patient Instructions (Signed)

## 2012-04-30 ENCOUNTER — Ambulatory Visit: Payer: Medicare Other | Admitting: Cardiology

## 2012-05-14 ENCOUNTER — Ambulatory Visit: Payer: Medicare Other | Admitting: Physician Assistant

## 2012-05-28 ENCOUNTER — Ambulatory Visit (INDEPENDENT_AMBULATORY_CARE_PROVIDER_SITE_OTHER): Payer: Medicare Other | Admitting: Cardiology

## 2012-05-28 ENCOUNTER — Encounter: Payer: Self-pay | Admitting: Cardiology

## 2012-05-28 VITALS — BP 133/66 | HR 77 | Wt 168.0 lb

## 2012-05-28 DIAGNOSIS — I2510 Atherosclerotic heart disease of native coronary artery without angina pectoris: Secondary | ICD-10-CM | POA: Diagnosis not present

## 2012-05-28 DIAGNOSIS — E782 Mixed hyperlipidemia: Secondary | ICD-10-CM | POA: Diagnosis not present

## 2012-05-28 DIAGNOSIS — I10 Essential (primary) hypertension: Secondary | ICD-10-CM | POA: Diagnosis not present

## 2012-05-28 MED ORDER — METOPROLOL SUCCINATE ER 25 MG PO TB24: 25.0000 mg | ORAL_TABLET | Freq: Every day | ORAL | Status: DC

## 2012-05-28 NOTE — Assessment & Plan Note (Signed)
Continue statin. 

## 2012-05-28 NOTE — Assessment & Plan Note (Signed)
Continue present medications. 

## 2012-05-28 NOTE — Patient Instructions (Addendum)
Your physician wants you to follow-up in: 6 MONTHS WITH DR Jens Som You will receive a reminder letter in the mail two months in advance. If you don't receive a letter, please call our office to schedule the follow-up appointment.   START METOPROLOL 25 MG ONE TABLET ONCE DAILY

## 2012-05-28 NOTE — Assessment & Plan Note (Signed)
Patient noted to have severe coronary disease on previous cardiac catheterization with preserved LV function. Based on anatomy she is not a candidate for PCI. She has considered coronary artery bypass and graft versus medical therapy. Given her age she would be high risk for surgery. She would prefer conservative measures. Plan continue aspirin and statin. Add Toprol 25 mg daily. She is not having chest pain. Given the severity of her coronary artery disease she would be high risk for any surgery.

## 2012-05-28 NOTE — Progress Notes (Signed)
HPI: Pleasant female for followup of CAD. Patient previously had a Myoview preop prior to knee surgery that showed a subtle reversible defect in the apical anterior wall superimposed on a small fixed defect in the anteroseptal wall of the mid ventricle and apex. This was suggestive of prior infarct and very mild ischemia in the anterior and apex. There is also felt to be a prior infarct in the inferior lateral wall. Ejection fraction 65%. Echocardiogram in September of 2013 showed normal LV function, mild to moderate left ventricular hypertrophy, mild left atrial enlargement and mild mitral regurgitation. Patient then had cardiac cath in Sept 2013. This revealed a 20% left main. The LAD has a proximal 50% lesion and there is diffuse 70% stenosis in the first diagonal. The second diagonal had an 80% proximal lesion. The mid LAD had an 80% lesion. The septals were large and supplied collaterals to the right coronary artery. The circumflex was severely diseased with a proximal diffuse 75% lesion and a totally occluded after the first marginal. The second marginal was totally occluded and filled via left to left collaterals. The right coronary was totally occluded. Ejection fraction was normal. PCI was not an option. We discussed CABG versus medical therapy and patient elected medical therapy. I last saw her in October of 2013. Since then, she has limited mobility because of knee pain but denies dyspnea, chest pain or syncope.  Current Outpatient Prescriptions  Medication Sig Dispense Refill  . aspirin 81 MG tablet Take 1 tablet (81 mg total) by mouth daily.  30 tablet    . atorvastatin (LIPITOR) 40 MG tablet Take 1 tablet (40 mg total) by mouth daily.  90 tablet  3  . b complex vitamins tablet Take 1 tablet by mouth daily.       . bisacodyl (DULCOLAX) 5 MG EC tablet Take 5 mg by mouth daily as needed. For constipation.      . celecoxib (CELEBREX) 200 MG capsule Take 200 mg by mouth as needed for pain.        . Cholecalciferol (VITAMIN D3) 1000 UNITS CAPS Take 1,000 Units by mouth daily.       . diclofenac sodium (VOLTAREN) 1 % GEL Apply 2 g topically 4 (four) times daily.      . Olmesartan-Amlodipine-HCTZ (TRIBENZOR) 40-5-12.5 MG TABS Take 1 tablet by mouth daily.  90 tablet  3  . traMADol (ULTRAM) 50 MG tablet Take 1 tablet (50 mg total) by mouth every 8 (eight) hours as needed for pain.  75 tablet  11   No current facility-administered medications for this visit.     Past Medical History  Diagnosis Date  . Hyperlipidemia   . Osteoporosis   . Arthritis   . Eczema   . Cataracts, bilateral   . Hypertension     primary Dr. Sanda Linger [phone], saw Dr. Jens Som x 1 cardio  . CAD (coronary artery disease)     Past Surgical History  Procedure Laterality Date  . Abdominal hysterectomy    . Fracture surgery      shoulder surgery 2008  . Lipoma excision      2011  . Total knee arthroplasty  09/27/2011    Procedure: TOTAL KNEE ARTHROPLASTY;  Surgeon: Thera Flake., MD;  Location: [facility] OR;  Service: Orthopedics;  Laterality: Right;  right total knee arthroplasty    History   Social History  . Marital Status: Widowed    Spouse Name: N/A    Number of  Children: N/A  . Years of Education: N/A   Occupational History  . Not on file.   Social History Main Topics  . Smoking status: Never Smoker   . Smokeless tobacco: Never Used  . Alcohol Use: No  . Drug Use: No  . Sexually Active: Not Currently   Other Topics Concern  . Not on file   Social History Narrative   Caffine drinks-Yes   Seat belt use-Yes   Regular exercise-Yes   Smoke alarm in home-Yes   Guns/firearms in home-NO   History of physical abuse-NO    ROS: right knee pain but no fevers or chills, productive cough, hemoptysis, dysphasia, odynophagia, melena, hematochezia, dysuria, hematuria, rash, seizure activity, orthopnea, PND, pedal edema, claudication. Remaining systems are negative.  Physical  Exam: Well-developed well-nourished in no acute distress.  Skin is warm and dry.  HEENT is normal.  Neck is supple.  Chest is clear to auscultation with normal expansion.  Cardiovascular exam is regular rate and rhythm.  Abdominal exam nontender or distended. No masses palpated. Extremities show no edema. neuro grossly intact  ECG sinus rhythm at a rate of 77. No ST changes.

## 2012-06-24 ENCOUNTER — Telehealth: Payer: Self-pay | Admitting: Cardiology

## 2012-06-24 NOTE — Telephone Encounter (Signed)
Spoke with pt, she took two doses of metoprolol and then stopped it because it made her dizzy, unsteady on her feet and depressed. She also reports watching her cholesterol and bp closely. Will make dr Jens Som aware.

## 2012-06-24 NOTE — Telephone Encounter (Signed)
New problem   Pt having side effects from metoprolol-er-succinate 25 mg and has stopped taking the medication

## 2012-06-30 ENCOUNTER — Other Ambulatory Visit: Payer: Self-pay | Admitting: Internal Medicine

## 2012-07-22 ENCOUNTER — Other Ambulatory Visit: Payer: Self-pay | Admitting: Internal Medicine

## 2012-07-30 DIAGNOSIS — M25569 Pain in unspecified knee: Secondary | ICD-10-CM | POA: Diagnosis not present

## 2012-08-13 ENCOUNTER — Encounter: Payer: Self-pay | Admitting: Internal Medicine

## 2012-08-13 ENCOUNTER — Ambulatory Visit: Payer: Medicare Other | Admitting: Internal Medicine

## 2012-08-13 ENCOUNTER — Ambulatory Visit (INDEPENDENT_AMBULATORY_CARE_PROVIDER_SITE_OTHER): Payer: Medicare Other | Admitting: Internal Medicine

## 2012-08-13 VITALS — BP 182/80 | HR 76 | Temp 98.2°F | Resp 16 | Wt 158.0 lb

## 2012-08-13 DIAGNOSIS — Z1231 Encounter for screening mammogram for malignant neoplasm of breast: Secondary | ICD-10-CM

## 2012-08-13 DIAGNOSIS — E785 Hyperlipidemia, unspecified: Secondary | ICD-10-CM | POA: Diagnosis not present

## 2012-08-13 DIAGNOSIS — M1710 Unilateral primary osteoarthritis, unspecified knee: Secondary | ICD-10-CM

## 2012-08-13 DIAGNOSIS — I10 Essential (primary) hypertension: Secondary | ICD-10-CM | POA: Diagnosis not present

## 2012-08-13 LAB — HM DEXA SCAN

## 2012-08-13 MED ORDER — OLMESARTAN-AMLODIPINE-HCTZ 40-5-25 MG PO TABS: 1.0000 | ORAL_TABLET | Freq: Every day | ORAL | Status: DC

## 2012-08-13 NOTE — Assessment & Plan Note (Signed)
Celebrex has not helped the pain much so she will stop it Tramadol dose help with her pain

## 2012-08-13 NOTE — Assessment & Plan Note (Signed)
Her BP is not well controlled I have advised her to stop celebrex I have increased the dose on tribenzor to get better control of the BP

## 2012-08-13 NOTE — Assessment & Plan Note (Signed)
She is doing well on lipitor 

## 2012-08-13 NOTE — Progress Notes (Signed)
Subjective:    Patient ID: Virginia Edwards, female    DOB: 06/22/1925, 79 y.o.   MRN: [id number]  Hypertension This is a chronic problem. The current episode started more than 1 year ago. The problem has been gradually worsening since onset. The problem is uncontrolled. Pertinent negatives include no anxiety, blurred vision, chest pain, headaches, malaise/fatigue, neck pain, orthopnea, palpitations, peripheral edema, PND, shortness of breath or sweats. Agents associated with hypertension include NSAIDs. Past treatments include angiotensin blockers, calcium channel blockers, diuretics and beta blockers. The current treatment provides moderate improvement. Compliance problems include exercise and diet.  Hypertensive end-organ damage includes CAD/MI.      Review of Systems  Constitutional: Negative.  Negative for fever, chills, malaise/fatigue, diaphoresis, activity change, appetite change, fatigue and unexpected weight change.  HENT: Negative.  Negative for neck pain.   Eyes: Negative.  Negative for blurred vision.  Respiratory: Negative.  Negative for cough, chest tightness, shortness of breath, wheezing and stridor.   Cardiovascular: Negative.  Negative for chest pain, palpitations, orthopnea, leg swelling and PND.  Gastrointestinal: Negative.  Negative for nausea, vomiting, abdominal pain, diarrhea and constipation.  Endocrine: Negative.   Genitourinary: Negative.   Musculoskeletal: Positive for arthralgias (both knees). Negative for myalgias, back pain, joint swelling and gait problem.  Skin: Negative.   Allergic/Immunologic: Negative.   Neurological: Negative.  Negative for dizziness, weakness, light-headedness and headaches.  Hematological: Negative.  Negative for adenopathy. Does not bruise/bleed easily.  Psychiatric/Behavioral: Negative.        Objective:   Physical Exam  Vitals reviewed. Constitutional: She is oriented to person, place, and time. She appears well-developed and  well-nourished. No distress.  HENT:  Head: Normocephalic and atraumatic.  Mouth/Throat: Oropharynx is clear and moist. No oropharyngeal exudate.  Eyes: Conjunctivae are normal. Right eye exhibits no discharge. Left eye exhibits no discharge. No scleral icterus.  Neck: Normal range of motion. Neck supple. No JVD present. No tracheal deviation present. No thyromegaly present.  Cardiovascular: Normal rate, regular rhythm, normal heart sounds and intact distal pulses.  Exam reveals no gallop and no friction rub.   No murmur heard. Pulmonary/Chest: Effort normal and breath sounds normal. No stridor. No respiratory distress. She has no wheezes. She has no rales. She exhibits no tenderness.  Abdominal: Soft. Bowel sounds are normal. She exhibits no distension and no mass. There is no tenderness. There is no rebound and no guarding.  Musculoskeletal: Normal range of motion. She exhibits no edema and no tenderness.  Lymphadenopathy:    She has no cervical adenopathy.  Neurological: She is oriented to person, place, and time.  Skin: Skin is warm and dry. No rash noted. She is not diaphoretic. No erythema. No pallor.  Psychiatric: She has a normal mood and affect. Her behavior is normal. Judgment and thought content normal.     Lab Results  Component Value Date   WBC 9.0 10/02/2011   HGB 12.8 10/02/2011   HCT 38.8 10/02/2011   PLT 226 10/02/2011   GLUCOSE 94 03/31/2012   CHOL 173 03/31/2012   TRIG 104.0 03/31/2012   HDL 52.20 03/31/2012   LDLDIRECT 201.5 07/05/2010   LDLCALC 100* 03/31/2012   ALT 16 03/31/2012   AST 20 03/31/2012   NA 138 03/31/2012   K 3.6 03/31/2012   CL 103 03/31/2012   CREATININE 0.8 03/31/2012   BUN 20 03/31/2012   CO2 28 03/31/2012   TSH 2.87 06/20/2011   INR 0.94 10/02/2011   HGBA1C 6.0 03/31/2012  Assessment & Plan:

## 2012-08-13 NOTE — Patient Instructions (Signed)

## 2012-08-26 ENCOUNTER — Other Ambulatory Visit: Payer: Self-pay | Admitting: Internal Medicine

## 2012-10-09 ENCOUNTER — Ambulatory Visit (INDEPENDENT_AMBULATORY_CARE_PROVIDER_SITE_OTHER): Payer: Medicare Other | Admitting: Internal Medicine

## 2012-10-09 ENCOUNTER — Encounter: Payer: Self-pay | Admitting: Internal Medicine

## 2012-10-09 VITALS — BP 140/72 | HR 72 | Temp 96.7°F | Resp 12 | Wt 157.0 lb

## 2012-10-09 DIAGNOSIS — I2510 Atherosclerotic heart disease of native coronary artery without angina pectoris: Secondary | ICD-10-CM

## 2012-10-09 DIAGNOSIS — Z23 Encounter for immunization: Secondary | ICD-10-CM | POA: Diagnosis not present

## 2012-10-09 DIAGNOSIS — I10 Essential (primary) hypertension: Secondary | ICD-10-CM

## 2012-10-09 DIAGNOSIS — IMO0002 Reserved for concepts with insufficient information to code with codable children: Secondary | ICD-10-CM

## 2012-10-09 DIAGNOSIS — E785 Hyperlipidemia, unspecified: Secondary | ICD-10-CM | POA: Diagnosis not present

## 2012-10-09 DIAGNOSIS — M1710 Unilateral primary osteoarthritis, unspecified knee: Secondary | ICD-10-CM

## 2012-10-09 MED ORDER — METOPROLOL SUCCINATE ER 25 MG PO TB24: 25.0000 mg | ORAL_TABLET | Freq: Every day | ORAL | Status: DC

## 2012-10-09 MED ORDER — OLMESARTAN-AMLODIPINE-HCTZ 40-5-25 MG PO TABS: 1.0000 | ORAL_TABLET | Freq: Every day | ORAL | Status: DC

## 2012-10-09 NOTE — Progress Notes (Signed)
  Subjective:    Patient ID: Virginia Edwards, female    DOB: 05/13/1925, 79 y.o.   MRN: [id number]  Hypertension This is a chronic problem. The current episode started more than 1 year ago. The problem is unchanged. The problem is controlled. Pertinent negatives include no anxiety, blurred vision, chest pain, headaches, malaise/fatigue, neck pain, orthopnea, palpitations, peripheral edema, PND, shortness of breath or sweats. Past treatments include diuretics, angiotensin blockers, beta blockers and calcium channel blockers. The current treatment provides significant improvement. Compliance problems include exercise and diet.       Review of Systems  Constitutional: Negative.  Negative for fever, chills, malaise/fatigue, diaphoresis, appetite change and fatigue.  HENT: Negative.  Negative for neck pain.   Eyes: Negative.  Negative for blurred vision.  Respiratory: Negative.  Negative for apnea, cough, choking, chest tightness, shortness of breath, wheezing and stridor.   Cardiovascular: Negative.  Negative for chest pain, palpitations, orthopnea, leg swelling and PND.  Gastrointestinal: Negative.  Negative for nausea, vomiting, abdominal pain, diarrhea, constipation and blood in stool.  Endocrine: Negative.   Genitourinary: Negative.   Musculoskeletal: Positive for arthralgias (knee pain). Negative for myalgias, back pain, joint swelling and gait problem.  Skin: Negative.   Allergic/Immunologic: Negative.   Neurological: Negative.  Negative for dizziness, tremors, weakness, light-headedness and headaches.  Hematological: Negative.  Negative for adenopathy. Does not bruise/bleed easily.  Psychiatric/Behavioral: Negative.        Objective:   Physical Exam  Vitals reviewed. Constitutional: She is oriented to person, place, and time. She appears well-developed and well-nourished. No distress.  HENT:  Head: Normocephalic and atraumatic.  Mouth/Throat: Oropharynx is clear and moist. No  oropharyngeal exudate.  Eyes: Conjunctivae are normal. Right eye exhibits no discharge. Left eye exhibits no discharge. No scleral icterus.  Neck: Normal range of motion. Neck supple. No JVD present. No tracheal deviation present. No thyromegaly present.  Cardiovascular: Normal rate, regular rhythm, normal heart sounds and intact distal pulses.  Exam reveals no gallop and no friction rub.   No murmur heard. Pulmonary/Chest: Effort normal and breath sounds normal. No stridor. No respiratory distress. She has no wheezes. She has no rales. She exhibits no tenderness.  Abdominal: Soft. Bowel sounds are normal. She exhibits no distension and no mass. There is no tenderness. There is no rebound and no guarding.  Musculoskeletal: Normal range of motion. She exhibits no edema and no tenderness.  Lymphadenopathy:    She has no cervical adenopathy.  Neurological: She is oriented to person, place, and time.  Skin: Skin is warm and dry. No rash noted. She is not diaphoretic. No erythema. No pallor.  Psychiatric: She has a normal mood and affect. Her behavior is normal. Judgment and thought content normal.      Lab Results  Component Value Date   WBC 9.0 10/02/2011   HGB 12.8 10/02/2011   HCT 38.8 10/02/2011   PLT 226 10/02/2011   GLUCOSE 94 03/31/2012   CHOL 173 03/31/2012   TRIG 104.0 03/31/2012   HDL 52.20 03/31/2012   LDLDIRECT 201.5 07/05/2010   LDLCALC 100* 03/31/2012   ALT 16 03/31/2012   AST 20 03/31/2012   NA 138 03/31/2012   K 3.6 03/31/2012   CL 103 03/31/2012   CREATININE 0.8 03/31/2012   BUN 20 03/31/2012   CO2 28 03/31/2012   TSH 2.87 06/20/2011   INR 0.94 10/02/2011   HGBA1C 6.0 03/31/2012      Assessment & Plan:

## 2012-10-09 NOTE — Assessment & Plan Note (Signed)
She is doing well on lipitor 

## 2012-10-09 NOTE — Assessment & Plan Note (Signed)
Continue tramadol as needed 

## 2012-10-09 NOTE — Assessment & Plan Note (Signed)
Her BP is well controlled 

## 2012-10-09 NOTE — Patient Instructions (Signed)

## 2012-10-12 ENCOUNTER — Telehealth: Payer: Self-pay

## 2012-10-12 NOTE — Telephone Encounter (Signed)
error 

## 2012-10-12 NOTE — Telephone Encounter (Signed)
I don't think this is the correct chart I know who this is about and will write the letter

## 2012-10-12 NOTE — Telephone Encounter (Signed)
Patient seen in office for appointment today and wanted to check status of letter requested at 09/29/12 appt. Per pt, letter requested is to advise prison system that due to breathing difficulty, and other medical problems, that she is unable to travel for visit. Pt request letter faxed to [phone]

## 2012-10-12 NOTE — Telephone Encounter (Signed)
Received fax requesting PA for tribenzor 40/5/25. Called [facility] and PA approved indefinite, pharmacy notified

## 2012-10-22 ENCOUNTER — Other Ambulatory Visit: Payer: Self-pay | Admitting: Internal Medicine

## 2012-11-05 DIAGNOSIS — M25569 Pain in unspecified knee: Secondary | ICD-10-CM | POA: Diagnosis not present

## 2012-11-26 ENCOUNTER — Encounter: Payer: Self-pay | Admitting: Cardiology

## 2012-11-26 ENCOUNTER — Ambulatory Visit (INDEPENDENT_AMBULATORY_CARE_PROVIDER_SITE_OTHER): Payer: Medicare Other | Admitting: Cardiology

## 2012-11-26 VITALS — BP 152/81 | HR 79 | Ht 62.0 in | Wt 155.1 lb

## 2012-11-26 DIAGNOSIS — I2510 Atherosclerotic heart disease of native coronary artery without angina pectoris: Secondary | ICD-10-CM

## 2012-11-26 DIAGNOSIS — E785 Hyperlipidemia, unspecified: Secondary | ICD-10-CM | POA: Diagnosis not present

## 2012-11-26 DIAGNOSIS — I10 Essential (primary) hypertension: Secondary | ICD-10-CM | POA: Diagnosis not present

## 2012-11-26 NOTE — Assessment & Plan Note (Signed)
Continue present blood pressure medications. 

## 2012-11-26 NOTE — Assessment & Plan Note (Signed)
Continue statin. 

## 2012-11-26 NOTE — Assessment & Plan Note (Signed)
Patient noted to have severe coronary disease on previous cardiac catheterization with preserved LV function. Based on anatomy she is not a candidate for PCI. She has considered coronary artery bypass and graft versus medical therapy. Given her age she would be high risk for surgery. She would prefer conservative measures. Plan continue aspirin and statin. She did not tolerate beta blockade. She is not having chest pain. Given the severity of her coronary artery disease she would be high risk for any surgery.

## 2012-11-26 NOTE — Progress Notes (Signed)
HPI: Pleasant female for followup of CAD. Patient previously had a Myoview preop prior to knee surgery that showed a subtle reversible defect in the apical anterior wall superimposed on a small fixed defect in the anteroseptal wall of the mid ventricle and apex. This was suggestive of prior infarct and very mild ischemia in the anterior and apex. There is also felt to be a prior infarct in the inferior lateral wall. Ejection fraction 65%. Echocardiogram in September of 2013 showed normal LV function, mild to moderate left ventricular hypertrophy, mild left atrial enlargement and mild mitral regurgitation. Patient then had cardiac cath in Sept 2013. This revealed a 20% left main. The LAD has a proximal 50% lesion and there is diffuse 70% stenosis in the first diagonal. The second diagonal had an 80% proximal lesion. The mid LAD had an 80% lesion. The septals were large and supplied collaterals to the right coronary artery. The circumflex was severely diseased with a proximal diffuse 75% lesion and a totally occluded after the first marginal. The second marginal was totally occluded and filled via left to left collaterals. The right coronary was totally occluded. Ejection fraction was normal. PCI was not an option. We discussed CABG versus medical therapy and patient elected medical therapy. I last saw her in May of 2014. Since then, she has limited mobility because of knee pain but denies dyspnea, chest pain or syncope.   Current Outpatient Prescriptions  Medication Sig Dispense Refill  . atorvastatin (LIPITOR) 40 MG tablet TAKE 1 TABLET BY MOUTH DAILY  90 tablet  3  . b complex vitamins tablet Take 1 tablet by mouth daily.       . bisacodyl (DULCOLAX) 5 MG EC tablet Take 5 mg by mouth daily as needed. For constipation.      . Cholecalciferol (VITAMIN D3) 1000 UNITS CAPS Take 1,000 Units by mouth daily.       . diclofenac sodium (VOLTAREN) 1 % GEL Apply 2 g topically 4 (four) times daily.      .  Olmesartan-Amlodipine-HCTZ (TRIBENZOR) 40-5-25 MG TABS Take 1 tablet by mouth daily.  90 tablet  3  . traMADol (ULTRAM) 50 MG tablet TAKE 1 TABLET BY MOUTH EVERY 8 HOURS AS NEEDED FOR PAIN  75 tablet  5   No current facility-administered medications for this visit.     Past Medical History  Diagnosis Date  . Hyperlipidemia   . Osteoporosis   . Arthritis   . Eczema   . Cataracts, bilateral   . Hypertension     primary Dr. Sanda Linger [phone], saw Dr. Jens Som x 1 cardio  . CAD (coronary artery disease)     Past Surgical History  Procedure Laterality Date  . Abdominal hysterectomy    . Fracture surgery      shoulder surgery 2008  . Lipoma excision      2011  . Total knee arthroplasty  09/27/2011    Procedure: TOTAL KNEE ARTHROPLASTY;  Surgeon: Thera Flake., MD;  Location: [facility] OR;  Service: Orthopedics;  Laterality: Right;  right total knee arthroplasty    History   Social History  . Marital Status: Widowed    Spouse Name: N/A    Number of Children: N/A  . Years of Education: N/A   Occupational History  . Not on file.   Social History Main Topics  . Smoking status: Never Smoker   . Smokeless tobacco: Never Used  . Alcohol Use: No  . Drug  Use: No  . Sexual Activity: Not Currently   Other Topics Concern  . Not on file   Social History Narrative   Caffine drinks-Yes   Seat belt use-Yes   Regular exercise-Yes   Smoke alarm in home-Yes   Guns/firearms in home-NO   History of physical abuse-NO    ROS: knee arthralgias but no fevers or chills, productive cough, hemoptysis, dysphasia, odynophagia, melena, hematochezia, dysuria, hematuria, rash, seizure activity, orthopnea, PND, pedal edema, claudication. Remaining systems are negative.  Physical Exam: Well-developed well-nourished in no acute distress.  Skin is warm and dry.  HEENT is normal.  Neck is supple.  Chest is clear to auscultation with normal expansion.  Cardiovascular exam is regular rate and  rhythm. 2/6 systolic ejection murmur Abdominal exam nontender or distended. No masses palpated. Extremities show no edema. neuro grossly intact  ECG sinus rhythm at a rate of 79. No ST changes

## 2012-11-26 NOTE — Patient Instructions (Signed)
Your physician wants you to follow-up in: ONE YEAR WITH DR CRENSHAW You will receive a reminder letter in the mail two months in advance. If you don't receive a letter, please call our office to schedule the follow-up appointment.  

## 2013-03-08 DIAGNOSIS — M25569 Pain in unspecified knee: Secondary | ICD-10-CM | POA: Diagnosis not present

## 2013-03-31 ENCOUNTER — Other Ambulatory Visit: Payer: Self-pay | Admitting: Internal Medicine

## 2013-04-08 ENCOUNTER — Ambulatory Visit (INDEPENDENT_AMBULATORY_CARE_PROVIDER_SITE_OTHER): Payer: Medicare Other | Admitting: Internal Medicine

## 2013-04-08 ENCOUNTER — Other Ambulatory Visit (INDEPENDENT_AMBULATORY_CARE_PROVIDER_SITE_OTHER): Payer: Medicare Other

## 2013-04-08 ENCOUNTER — Encounter: Payer: Self-pay | Admitting: Internal Medicine

## 2013-04-08 VITALS — BP 180/80 | HR 78 | Temp 97.0°F | Resp 16 | Ht 62.0 in | Wt 152.0 lb

## 2013-04-08 DIAGNOSIS — I10 Essential (primary) hypertension: Secondary | ICD-10-CM

## 2013-04-08 DIAGNOSIS — M1710 Unilateral primary osteoarthritis, unspecified knee: Secondary | ICD-10-CM

## 2013-04-08 DIAGNOSIS — I2510 Atherosclerotic heart disease of native coronary artery without angina pectoris: Secondary | ICD-10-CM

## 2013-04-08 DIAGNOSIS — E785 Hyperlipidemia, unspecified: Secondary | ICD-10-CM

## 2013-04-08 DIAGNOSIS — E876 Hypokalemia: Secondary | ICD-10-CM | POA: Diagnosis not present

## 2013-04-08 DIAGNOSIS — M179 Osteoarthritis of knee, unspecified: Secondary | ICD-10-CM

## 2013-04-08 DIAGNOSIS — IMO0002 Reserved for concepts with insufficient information to code with codable children: Secondary | ICD-10-CM

## 2013-04-08 LAB — LIPID PANEL
CHOLESTEROL: 164 mg/dL (ref 0–200)
HDL: 55 mg/dL (ref >39.00)
LDL CALC: 87 mg/dL (ref 0–99)
TRIGLYCERIDES: 111 mg/dL (ref 0.0–149.0)
Total CHOL/HDL Ratio: 3
VLDL: 22.2 mg/dL (ref 0.0–40.0)

## 2013-04-08 LAB — URINALYSIS, ROUTINE W REFLEX MICROSCOPIC
BILIRUBIN URINE: NEGATIVE
HGB URINE DIPSTICK: NEGATIVE
KETONES UR: NEGATIVE
NITRITE: NEGATIVE
Specific Gravity, Urine: 1.01 (ref 1.000–1.030)
Total Protein, Urine: NEGATIVE
UROBILINOGEN UA: 0.2 (ref 0.0–1.0)
Urine Glucose: NEGATIVE
pH: 6.5 (ref 5.0–8.0)

## 2013-04-08 LAB — BASIC METABOLIC PANEL
BUN: 18 mg/dL (ref 6–23)
CHLORIDE: 103 meq/L (ref 96–112)
CO2: 27 mEq/L (ref 19–32)
CREATININE: 0.8 mg/dL (ref 0.4–1.2)
Calcium: 9.4 mg/dL (ref 8.4–10.5)
GFR: 73.39 mL/min (ref >60.00)
GLUCOSE: 96 mg/dL (ref 70–99)
Potassium: 3.2 mEq/L — ABNORMAL LOW (ref 3.5–5.1)
Sodium: 138 mEq/L (ref 135–145)

## 2013-04-08 LAB — CBC WITH DIFFERENTIAL/PLATELET
BASOS ABS: 0 10*3/uL (ref 0.0–0.1)
Basophils Relative: 0.2 % (ref 0.0–3.0)
EOS ABS: 0.1 10*3/uL (ref 0.0–0.7)
EOS PCT: 1.1 % (ref 0.0–5.0)
HCT: 37.1 % (ref 36.0–46.0)
Hemoglobin: 12.2 g/dL (ref 12.0–15.0)
Lymphocytes Relative: 26.1 % (ref 12.0–46.0)
Lymphs Abs: 2.5 10*3/uL (ref 0.7–4.0)
MCHC: 32.9 g/dL (ref 30.0–36.0)
MCV: 91 fl (ref 78.0–100.0)
MONO ABS: 0.7 10*3/uL (ref 0.1–1.0)
Monocytes Relative: 7.7 % (ref 3.0–12.0)
NEUTROS PCT: 64.9 % (ref 43.0–77.0)
Neutro Abs: 6.3 10*3/uL (ref 1.4–7.7)
PLATELETS: 232 10*3/uL (ref 150.0–400.0)
RBC: 4.08 Mil/uL (ref 3.87–5.11)
RDW: 13.1 % (ref 11.5–14.6)
WBC: 9.6 10*3/uL (ref 4.5–10.5)

## 2013-04-08 LAB — TSH: TSH: 3.03 u[IU]/mL (ref 0.35–5.50)

## 2013-04-08 MED ORDER — POTASSIUM CHLORIDE CRYS ER 20 MEQ PO TBCR: 20.0000 meq | EXTENDED_RELEASE_TABLET | Freq: Three times a day (TID) | ORAL | Status: DC

## 2013-04-08 MED ORDER — NEBIVOLOL HCL 5 MG PO TABS: 5.0000 mg | ORAL_TABLET | Freq: Every day | ORAL | Status: DC

## 2013-04-08 NOTE — Progress Notes (Signed)
Pre visit review using our clinic review tool, if applicable. No additional management support is needed unless otherwise documented below in the visit note. 

## 2013-04-08 NOTE — Progress Notes (Signed)
   Subjective:    Patient ID: Virginia Edwards, female    DOB: 11/09/1925, 80 y.o.   MRN: [id number]  Hypertension This is a chronic problem. The current episode started more than 1 year ago. The problem has been gradually worsening since onset. The problem is uncontrolled. Pertinent negatives include no anxiety, blurred vision, chest pain, headaches, malaise/fatigue, neck pain, orthopnea, palpitations, peripheral edema, PND, shortness of breath or sweats. There are no associated agents to hypertension. Past treatments include angiotensin blockers, calcium channel blockers and diuretics. The current treatment provides moderate improvement. Compliance problems include exercise and diet.  Hypertensive end-organ damage includes CAD/MI.      Review of Systems  Constitutional: Negative.  Negative for fever, chills, malaise/fatigue, diaphoresis, appetite change and fatigue.  HENT: Negative.   Eyes: Negative.  Negative for blurred vision.  Respiratory: Negative.  Negative for cough, choking, chest tightness, shortness of breath and stridor.   Cardiovascular: Negative.  Negative for chest pain, palpitations, orthopnea, leg swelling and PND.  Gastrointestinal: Negative.  Negative for nausea, vomiting, abdominal pain, diarrhea and blood in stool.  Endocrine: Negative.   Genitourinary: Negative.  Negative for dysuria, hematuria, flank pain and difficulty urinating.  Musculoskeletal: Positive for arthralgias. Negative for back pain, gait problem, joint swelling, myalgias, neck pain and neck stiffness.  Allergic/Immunologic: Negative.   Neurological: Negative.  Negative for dizziness and headaches.  Hematological: Negative.  Negative for adenopathy. Does not bruise/bleed easily.  Psychiatric/Behavioral: Negative.        Objective:   Physical Exam  Vitals reviewed. Constitutional: She is oriented to person, place, and time. She appears well-developed and well-nourished. No distress.  HENT:  Head:  Normocephalic and atraumatic.  Mouth/Throat: Oropharynx is clear and moist. No oropharyngeal exudate.  Eyes: Conjunctivae are normal. Right eye exhibits no discharge. Left eye exhibits no discharge. No scleral icterus.  Neck: Normal range of motion. Neck supple. No JVD present. No tracheal deviation present. No thyromegaly present.  Cardiovascular: Normal rate, regular rhythm, normal heart sounds and intact distal pulses.  Exam reveals no gallop and no friction rub.   No murmur heard. Pulmonary/Chest: Effort normal and breath sounds normal. No stridor. No respiratory distress. She has no wheezes. She has no rales. She exhibits no tenderness.  Abdominal: Soft. Bowel sounds are normal. She exhibits no distension and no mass. There is no tenderness. There is no rebound and no guarding.  Musculoskeletal: Normal range of motion. She exhibits no edema and no tenderness.  Lymphadenopathy:    She has no cervical adenopathy.  Neurological: She is oriented to person, place, and time.  Skin: Skin is warm and dry. No rash noted. She is not diaphoretic. No erythema. No pallor.     Lab Results  Component Value Date   WBC 9.0 10/02/2011   HGB 12.8 10/02/2011   HCT 38.8 10/02/2011   PLT 226 10/02/2011   GLUCOSE 94 03/31/2012   CHOL 173 03/31/2012   TRIG 104.0 03/31/2012   HDL 52.20 03/31/2012   LDLDIRECT 201.5 07/05/2010   LDLCALC 100* 03/31/2012   ALT 16 03/31/2012   AST 20 03/31/2012   NA 138 03/31/2012   K 3.6 03/31/2012   CL 103 03/31/2012   CREATININE 0.8 03/31/2012   BUN 20 03/31/2012   CO2 28 03/31/2012   TSH 2.87 06/20/2011   INR 0.94 10/02/2011   HGBA1C 6.0 03/31/2012       Assessment & Plan:

## 2013-04-08 NOTE — Patient Instructions (Signed)

## 2013-04-09 NOTE — Assessment & Plan Note (Signed)
Her BP is not well controlled I have asked her to add bystolic to tribenzor I will check her labs today to look for end organ damage and secondary causes of HTN

## 2013-04-09 NOTE — Assessment & Plan Note (Signed)
This is caused by HCTZ, I have asked her to start a K+ supplement

## 2013-04-09 NOTE — Assessment & Plan Note (Signed)
She will cont tramadol as needed for pain 

## 2013-04-09 NOTE — Assessment & Plan Note (Signed)
Goal achieved 

## 2013-04-14 ENCOUNTER — Telehealth: Payer: Self-pay | Admitting: *Deleted

## 2013-04-14 NOTE — Telephone Encounter (Signed)
Try taking 1/2 tab of Bystolic If unable to do so, or if continued symptoms, stop Bystolic and schedule ROV with TJ

## 2013-04-14 NOTE — Telephone Encounter (Signed)
Pt called states Bystolic is causing dizziness and weakness.  Please advise in Dr Yetta BarreJones absence

## 2013-04-15 NOTE — Telephone Encounter (Signed)
Spoke with pt advised of MDs message, pt states she does not want to take Bystolic at all.  Attempted to schedule OV pt wants to call back.

## 2013-04-22 ENCOUNTER — Telehealth: Payer: Self-pay | Admitting: *Deleted

## 2013-04-22 ENCOUNTER — Encounter: Payer: Self-pay | Admitting: Internal Medicine

## 2013-04-22 ENCOUNTER — Other Ambulatory Visit (INDEPENDENT_AMBULATORY_CARE_PROVIDER_SITE_OTHER): Payer: Medicare Other

## 2013-04-22 ENCOUNTER — Other Ambulatory Visit: Payer: Self-pay | Admitting: Internal Medicine

## 2013-04-22 ENCOUNTER — Telehealth: Payer: Self-pay | Admitting: Internal Medicine

## 2013-04-22 ENCOUNTER — Ambulatory Visit (INDEPENDENT_AMBULATORY_CARE_PROVIDER_SITE_OTHER): Payer: Medicare Other | Admitting: Internal Medicine

## 2013-04-22 VITALS — BP 142/80 | HR 76 | Temp 98.4°F | Resp 16 | Wt 151.0 lb

## 2013-04-22 DIAGNOSIS — IMO0002 Reserved for concepts with insufficient information to code with codable children: Secondary | ICD-10-CM

## 2013-04-22 DIAGNOSIS — M1710 Unilateral primary osteoarthritis, unspecified knee: Secondary | ICD-10-CM | POA: Diagnosis not present

## 2013-04-22 DIAGNOSIS — E876 Hypokalemia: Secondary | ICD-10-CM

## 2013-04-22 DIAGNOSIS — I10 Essential (primary) hypertension: Secondary | ICD-10-CM

## 2013-04-22 DIAGNOSIS — I2510 Atherosclerotic heart disease of native coronary artery without angina pectoris: Secondary | ICD-10-CM | POA: Diagnosis not present

## 2013-04-22 DIAGNOSIS — M179 Osteoarthritis of knee, unspecified: Secondary | ICD-10-CM

## 2013-04-22 DIAGNOSIS — Z79899 Other long term (current) drug therapy: Secondary | ICD-10-CM | POA: Diagnosis not present

## 2013-04-22 DIAGNOSIS — G4700 Insomnia, unspecified: Secondary | ICD-10-CM | POA: Diagnosis not present

## 2013-04-22 LAB — BASIC METABOLIC PANEL
BUN: 19 mg/dL (ref 6–23)
CALCIUM: 9.9 mg/dL (ref 8.4–10.5)
CO2: 27 meq/L (ref 19–32)
CREATININE: 0.7 mg/dL (ref 0.4–1.2)
Chloride: 98 mEq/L (ref 96–112)
GFR: 81.68 mL/min (ref >60.00)
Glucose, Bld: 91 mg/dL (ref 70–99)
Potassium: 3.3 mEq/L — ABNORMAL LOW (ref 3.5–5.1)
Sodium: 135 mEq/L (ref 135–145)

## 2013-04-22 LAB — MAGNESIUM: MAGNESIUM: 2 mg/dL (ref 1.5–2.5)

## 2013-04-22 MED ORDER — DOXEPIN HCL 3 MG PO TABS: 1.0000 | ORAL_TABLET | Freq: Every evening | ORAL | Status: DC | PRN

## 2013-04-22 MED ORDER — TRAMADOL HCL 50 MG PO TABS: 100.0000 mg | ORAL_TABLET | Freq: Four times a day (QID) | ORAL | Status: DC | PRN

## 2013-04-22 NOTE — Assessment & Plan Note (Signed)
Her BP is well controlled 

## 2013-04-22 NOTE — Telephone Encounter (Signed)
Patient would like to know if she will get a script for potassium

## 2013-04-22 NOTE — Progress Notes (Signed)
Subjective:    Patient ID: Virginia Edwards, female    DOB: 06/01/1925, [age] y.o.   MRN: [id number]  Hypertension This is a chronic problem. The current episode started more than 1 year ago. The problem has been gradually improving since onset. The problem is controlled. Pertinent negatives include no anxiety, blurred vision, chest pain, headaches, malaise/fatigue, neck pain, orthopnea, palpitations, peripheral edema, PND, shortness of breath or sweats. There are no associated agents to hypertension. Past treatments include angiotensin blockers, calcium channel blockers, diuretics and beta blockers. The current treatment provides moderate improvement. Compliance problems include diet and exercise.       Review of Systems  Constitutional: Negative.  Negative for malaise/fatigue, diaphoresis and fatigue.  HENT: Negative.   Eyes: Negative.  Negative for blurred vision.  Respiratory: Negative.  Negative for cough, choking, chest tightness, shortness of breath and stridor.   Cardiovascular: Negative.  Negative for chest pain, palpitations, orthopnea, leg swelling and PND.  Gastrointestinal: Negative.  Negative for nausea, vomiting, abdominal pain, diarrhea, constipation and blood in stool.  Endocrine: Negative.   Genitourinary: Negative.   Musculoskeletal: Positive for arthralgias (knee pain). Negative for back pain, gait problem, joint swelling, myalgias, neck pain and neck stiffness.  Skin: Negative.   Allergic/Immunologic: Negative.   Neurological: Negative.  Negative for dizziness, seizures, speech difficulty, weakness, numbness and headaches.  Hematological: Negative.  Negative for adenopathy. Does not bruise/bleed easily.  Psychiatric/Behavioral: Positive for sleep disturbance. Negative for suicidal ideas, hallucinations, behavioral problems, confusion, self-injury, dysphoric mood, decreased concentration and agitation. The patient is nervous/anxious. The patient is not hyperactive.          Objective:   Physical Exam  Vitals reviewed. Constitutional: She is oriented to person, place, and time. She appears well-developed and well-nourished. No distress.  HENT:  Head: Normocephalic and atraumatic.  Mouth/Throat: Oropharynx is clear and moist. No oropharyngeal exudate.  Eyes: Conjunctivae are normal. Right eye exhibits no discharge. Left eye exhibits no discharge. No scleral icterus.  Neck: Normal range of motion. Neck supple. No JVD present. No tracheal deviation present. No thyromegaly present.  Cardiovascular: Normal rate, regular rhythm, normal heart sounds and intact distal pulses.  Exam reveals no gallop and no friction rub.   No murmur heard. Pulmonary/Chest: Effort normal and breath sounds normal. No stridor. No respiratory distress. She has no wheezes. She has no rales. She exhibits no tenderness.  Abdominal: Soft. Bowel sounds are normal. She exhibits no distension and no mass. There is no tenderness. There is no rebound and no guarding.  Musculoskeletal: Normal range of motion. She exhibits no edema and no tenderness.  Lymphadenopathy:    She has no cervical adenopathy.  Neurological: She is oriented to person, place, and time.  Skin: Skin is warm and dry. No rash noted. She is not diaphoretic. No erythema. No pallor.     Lab Results  Component Value Date   WBC 9.6 04/08/2013   HGB 12.2 04/08/2013   HCT 37.1 04/08/2013   PLT 232.0 04/08/2013   GLUCOSE 96 04/08/2013   CHOL 164 04/08/2013   TRIG 111.0 04/08/2013   HDL 55.00 04/08/2013   LDLDIRECT 201.5 07/05/2010   LDLCALC 87 04/08/2013   ALT 16 03/31/2012   AST 20 03/31/2012   NA 138 04/08/2013   K 3.2* 04/08/2013   CL 103 04/08/2013   CREATININE 0.8 04/08/2013   BUN 18 04/08/2013   CO2 27 04/08/2013   TSH 3.03 04/08/2013   INR 0.94 10/02/2011   HGBA1C 6.0 03/31/2012  Assessment & Plan:

## 2013-04-22 NOTE — Assessment & Plan Note (Signed)
She has not been taking the K+ replacement but today she agrees to start taking it

## 2013-04-22 NOTE — Telephone Encounter (Signed)
RX approved and refilled to [facility] potassium chloride SA (K-DUR,KLOR-CON) 20 MEQ tablet 180 tablet 1 04/08/2013     Sig - Route: Take 1 tablet (20 mEq total) by mouth 3 (three) times daily. - Oral    E-Prescribing Status: Receipt confirmed by pharmacy (04/08/2013 12:58 PM EDT)

## 2013-04-22 NOTE — Patient Instructions (Signed)

## 2013-04-22 NOTE — Assessment & Plan Note (Signed)
She wants to try a higher dose of tramadol for the pain and I am okay with that

## 2013-04-22 NOTE — Progress Notes (Signed)
Pre visit review using our clinic review tool, if applicable. No additional management support is needed unless otherwise documented below in the visit note. 

## 2013-04-22 NOTE — Telephone Encounter (Signed)
Pt called states she is taking Miralax and not Dolculax as directed.  She is wanting Dr Virginia Edwards to know that.

## 2013-04-22 NOTE — Assessment & Plan Note (Signed)
She will try silenor for insomnia

## 2013-04-26 ENCOUNTER — Telehealth: Payer: Self-pay | Admitting: Internal Medicine

## 2013-04-26 NOTE — Telephone Encounter (Signed)
Relevant patient education mailed to patient.  

## 2013-04-27 ENCOUNTER — Telehealth: Payer: Self-pay | Admitting: *Deleted

## 2013-04-27 NOTE — Telephone Encounter (Signed)
Spoke with pt advised of MDs message 

## 2013-04-27 NOTE — Telephone Encounter (Signed)
It is her diuretic

## 2013-04-27 NOTE — Telephone Encounter (Signed)
Pt called states she is unable to take the Potassium tablet.  She states it is too large to swallow and when she cut it in half it crumbled.  she further states she had leg pain from her foot to her hip.  She is wanting to know what is causing her Potassium to be low.  Please advise

## 2013-05-07 ENCOUNTER — Telehealth: Payer: Self-pay

## 2013-05-07 NOTE — Telephone Encounter (Signed)
Received pharmacy rejection stating that insurance will not cover Tramadol 50mg  tabs without a prior authorization. PA request initiated via [employer].

## 2013-05-24 ENCOUNTER — Encounter: Payer: Self-pay | Admitting: Internal Medicine

## 2013-06-23 ENCOUNTER — Ambulatory Visit: Payer: Medicare Other | Admitting: Internal Medicine

## 2013-08-12 ENCOUNTER — Other Ambulatory Visit: Payer: Self-pay | Admitting: Internal Medicine

## 2013-08-25 ENCOUNTER — Other Ambulatory Visit (INDEPENDENT_AMBULATORY_CARE_PROVIDER_SITE_OTHER): Payer: Medicare Other

## 2013-08-25 ENCOUNTER — Ambulatory Visit (INDEPENDENT_AMBULATORY_CARE_PROVIDER_SITE_OTHER): Payer: Medicare Other | Admitting: Internal Medicine

## 2013-08-25 ENCOUNTER — Encounter: Payer: Self-pay | Admitting: Internal Medicine

## 2013-08-25 VITALS — BP 138/69 | HR 73 | Temp 97.6°F | Resp 16 | Wt 144.0 lb

## 2013-08-25 DIAGNOSIS — I2510 Atherosclerotic heart disease of native coronary artery without angina pectoris: Secondary | ICD-10-CM

## 2013-08-25 DIAGNOSIS — M1710 Unilateral primary osteoarthritis, unspecified knee: Secondary | ICD-10-CM | POA: Diagnosis not present

## 2013-08-25 DIAGNOSIS — M179 Osteoarthritis of knee, unspecified: Secondary | ICD-10-CM

## 2013-08-25 DIAGNOSIS — I10 Essential (primary) hypertension: Secondary | ICD-10-CM

## 2013-08-25 DIAGNOSIS — E876 Hypokalemia: Secondary | ICD-10-CM

## 2013-08-25 LAB — BASIC METABOLIC PANEL
BUN: 23 mg/dL (ref 6–23)
CHLORIDE: 101 meq/L (ref 96–112)
CO2: 28 meq/L (ref 19–32)
CREATININE: 0.9 mg/dL (ref 0.4–1.2)
Calcium: 9.8 mg/dL (ref 8.4–10.5)
GFR: 63.08 mL/min (ref >60.00)
Glucose, Bld: 89 mg/dL (ref 70–99)
Potassium: 4.1 mEq/L (ref 3.5–5.1)
Sodium: 135 mEq/L (ref 135–145)

## 2013-08-25 MED ORDER — TRAMADOL HCL 50 MG PO TABS: 50.0000 mg | ORAL_TABLET | Freq: Four times a day (QID) | ORAL | Status: DC | PRN

## 2013-08-25 NOTE — Assessment & Plan Note (Signed)
Her BP is well controlled I will monitor her lytes and renal function 

## 2013-08-25 NOTE — Assessment & Plan Note (Signed)
I will recheck her K+ level today 

## 2013-08-25 NOTE — Assessment & Plan Note (Signed)
She will cont tramadol as needed for pain 

## 2013-08-25 NOTE — Progress Notes (Signed)
Pre visit review using our clinic review tool, if applicable. No additional management support is needed unless otherwise documented below in the visit note. 

## 2013-08-25 NOTE — Patient Instructions (Signed)

## 2013-08-25 NOTE — Progress Notes (Signed)
Subjective:    Patient ID: Virginia RegulusJanet Decker, female    DOB: 03/24/1925, 80 y.o.   MRN: [id number]  Hypertension This is a chronic problem. The current episode started more than 1 year ago. The problem has been gradually improving since onset. The problem is controlled. Associated symptoms include anxiety. Pertinent negatives include no blurred vision, chest pain, headaches, neck pain, orthopnea, palpitations, peripheral edema, PND, shortness of breath or sweats. There are no associated agents to hypertension. Past treatments include angiotensin blockers, calcium channel blockers and diuretics. The current treatment provides moderate improvement. Compliance problems include exercise and diet.  Hypertensive end-organ damage includes CAD/MI.      Review of Systems  Constitutional: Negative.   HENT: Negative.   Eyes: Negative.  Negative for blurred vision.  Respiratory: Negative.  Negative for apnea, cough, choking, chest tightness, shortness of breath, wheezing and stridor.   Cardiovascular: Negative.  Negative for chest pain, palpitations, orthopnea, leg swelling and PND.  Gastrointestinal: Negative.  Negative for nausea, vomiting, abdominal pain, diarrhea, constipation and blood in stool.  Endocrine: Negative.   Genitourinary: Negative.   Musculoskeletal: Negative.  Negative for back pain, gait problem, joint swelling, myalgias, neck pain and neck stiffness.  Skin: Negative.  Negative for rash.  Allergic/Immunologic: Negative.   Neurological: Negative.  Negative for dizziness, tremors, seizures, syncope, facial asymmetry, speech difficulty, weakness, light-headedness, numbness and headaches.  Hematological: Negative.  Negative for adenopathy. Does not bruise/bleed easily.  Psychiatric/Behavioral: Negative.        Objective:   Physical Exam  Vitals reviewed. Constitutional: She is oriented to person, place, and time. She appears well-developed and well-nourished. No distress.  HENT:    Head: Normocephalic and atraumatic.  Mouth/Throat: Oropharynx is clear and moist. No oropharyngeal exudate.  Eyes: Conjunctivae are normal. Right eye exhibits no discharge. Left eye exhibits no discharge. No scleral icterus.  Neck: Normal range of motion. Neck supple. No JVD present. No tracheal deviation present. No thyromegaly present.  Cardiovascular: Normal rate, regular rhythm, S1 normal, S2 normal and intact distal pulses.  Exam reveals no gallop, no S3, no S4 and no friction rub.   Murmur heard.  Decrescendo systolic murmur is present with a grade of 2/6   No diastolic murmur is present  Pulmonary/Chest: Effort normal and breath sounds normal. No stridor. No respiratory distress. She has no wheezes. She has no rales. She exhibits no tenderness.  Abdominal: Soft. Bowel sounds are normal. She exhibits no distension and no mass. There is no tenderness. There is no rebound and no guarding.  Musculoskeletal: Normal range of motion. She exhibits no edema and no tenderness.  Lymphadenopathy:    She has no cervical adenopathy.  Neurological: She is oriented to person, place, and time.  Skin: Skin is warm and dry. No rash noted. She is not diaphoretic. No erythema. No pallor.     Lab Results  Component Value Date   WBC 9.6 04/08/2013   HGB 12.2 04/08/2013   HCT 37.1 04/08/2013   PLT 232.0 04/08/2013   GLUCOSE 91 04/22/2013   CHOL 164 04/08/2013   TRIG 111.0 04/08/2013   HDL 55.00 04/08/2013   LDLDIRECT 201.5 07/05/2010   LDLCALC 87 04/08/2013   ALT 16 03/31/2012   AST 20 03/31/2012   NA 135 04/22/2013   K 3.3* 04/22/2013   CL 98 04/22/2013   CREATININE 0.7 04/22/2013   BUN 19 04/22/2013   CO2 27 04/22/2013   TSH 3.03 04/08/2013   INR 0.94 10/02/2011   HGBA1C  6.0 03/31/2012       Assessment & Plan:

## 2013-08-26 ENCOUNTER — Other Ambulatory Visit: Payer: Self-pay | Admitting: Internal Medicine

## 2013-09-10 ENCOUNTER — Telehealth: Payer: Self-pay | Admitting: Internal Medicine

## 2013-09-10 NOTE — Telephone Encounter (Signed)
Pt notified per MD, and would like to know if she should continue talking potassium. I advised probably so, but will double check with PCP.

## 2013-09-10 NOTE — Telephone Encounter (Signed)
The labs were excellent

## 2013-09-10 NOTE — Telephone Encounter (Signed)
Patient notified

## 2013-09-10 NOTE — Telephone Encounter (Signed)
Patient had labs on 08/25/13 and has not heard back about her results. Please advise.

## 2013-09-10 NOTE — Telephone Encounter (Signed)
Yes, she should.

## 2013-11-09 ENCOUNTER — Other Ambulatory Visit: Payer: Self-pay | Admitting: Internal Medicine

## 2013-11-11 ENCOUNTER — Ambulatory Visit: Payer: Medicare Other | Admitting: Internal Medicine

## 2013-11-15 ENCOUNTER — Other Ambulatory Visit: Payer: Self-pay | Admitting: Internal Medicine

## 2013-11-16 DIAGNOSIS — M545 Low back pain: Secondary | ICD-10-CM | POA: Diagnosis not present

## 2013-11-16 DIAGNOSIS — M170 Bilateral primary osteoarthritis of knee: Secondary | ICD-10-CM | POA: Diagnosis not present

## 2013-11-25 ENCOUNTER — Ambulatory Visit: Payer: Medicare Other | Admitting: Internal Medicine

## 2013-12-02 ENCOUNTER — Ambulatory Visit: Payer: Medicare Other | Admitting: Cardiology

## 2013-12-03 ENCOUNTER — Ambulatory Visit: Payer: Medicare Other | Admitting: Cardiology

## 2013-12-07 ENCOUNTER — Telehealth: Payer: Self-pay | Admitting: *Deleted

## 2013-12-07 DIAGNOSIS — M179 Osteoarthritis of knee, unspecified: Secondary | ICD-10-CM

## 2013-12-07 MED ORDER — TRAMADOL HCL 50 MG PO TABS: ORAL_TABLET | ORAL | Status: DC

## 2013-12-07 NOTE — Telephone Encounter (Signed)
[facility] called office concerning patient's Tramadol rx. Patient had stated to pharmacy that she takes 2 tablets q6hrs , however rx in med list is 1 tablet q6hrs. Did see one rx for tramadol called in back in July that was 2 tab q6hrs. Please advise?

## 2013-12-07 NOTE — Telephone Encounter (Signed)
[facility]

## 2013-12-07 NOTE — Telephone Encounter (Signed)
Either is okay with me Let the patient decide

## 2013-12-23 ENCOUNTER — Encounter: Payer: Self-pay | Admitting: Internal Medicine

## 2013-12-23 ENCOUNTER — Other Ambulatory Visit (INDEPENDENT_AMBULATORY_CARE_PROVIDER_SITE_OTHER): Payer: Medicare Other

## 2013-12-23 ENCOUNTER — Ambulatory Visit (INDEPENDENT_AMBULATORY_CARE_PROVIDER_SITE_OTHER): Payer: Medicare Other | Admitting: Internal Medicine

## 2013-12-23 VITALS — BP 138/84 | HR 81 | Temp 97.9°F | Resp 16 | Ht 62.0 in | Wt 135.2 lb

## 2013-12-23 DIAGNOSIS — Z23 Encounter for immunization: Secondary | ICD-10-CM

## 2013-12-23 DIAGNOSIS — E876 Hypokalemia: Secondary | ICD-10-CM | POA: Diagnosis not present

## 2013-12-23 DIAGNOSIS — I10 Essential (primary) hypertension: Secondary | ICD-10-CM | POA: Diagnosis not present

## 2013-12-23 DIAGNOSIS — I2510 Atherosclerotic heart disease of native coronary artery without angina pectoris: Secondary | ICD-10-CM | POA: Diagnosis not present

## 2013-12-23 DIAGNOSIS — M170 Bilateral primary osteoarthritis of knee: Secondary | ICD-10-CM | POA: Diagnosis not present

## 2013-12-23 LAB — BASIC METABOLIC PANEL
BUN: 15 mg/dL (ref 6–23)
CHLORIDE: 103 meq/L (ref 96–112)
CO2: 26 mEq/L (ref 19–32)
CREATININE: 0.8 mg/dL (ref 0.4–1.2)
Calcium: 9.6 mg/dL (ref 8.4–10.5)
GFR: 75.47 mL/min (ref >60.00)
Glucose, Bld: 92 mg/dL (ref 70–99)
Potassium: 3 mEq/L — ABNORMAL LOW (ref 3.5–5.1)
Sodium: 138 mEq/L (ref 135–145)

## 2013-12-23 MED ORDER — HYDROCODONE-ACETAMINOPHEN 5-325 MG PO TABS: 1.0000 | ORAL_TABLET | Freq: Four times a day (QID) | ORAL | Status: DC | PRN

## 2013-12-23 MED ORDER — POTASSIUM CHLORIDE CRYS ER 20 MEQ PO TBCR: 20.0000 meq | EXTENDED_RELEASE_TABLET | Freq: Two times a day (BID) | ORAL | Status: DC

## 2013-12-23 NOTE — Progress Notes (Signed)
   Subjective:    Patient ID: Virginia Edwards, female    DOB: 11/16/1925, 80 y.o.   MRN: [id number]  Hypertension This is a chronic problem. The current episode started more than 1 year ago. The problem is unchanged. The problem is controlled. Pertinent negatives include no anxiety, blurred vision, chest pain, headaches, malaise/fatigue, neck pain, orthopnea, palpitations, peripheral edema, PND, shortness of breath or sweats. There are no associated agents to hypertension. Past treatments include angiotensin blockers, calcium channel blockers and diuretics. The current treatment provides significant improvement. Compliance problems include diet and exercise.  Hypertensive end-organ damage includes CAD/MI.      Review of Systems  Constitutional: Negative.  Negative for fever, chills, malaise/fatigue, diaphoresis, appetite change and fatigue.  HENT: Negative.   Eyes: Negative.  Negative for blurred vision.  Respiratory: Negative.  Negative for choking, chest tightness and shortness of breath.   Cardiovascular: Negative.  Negative for chest pain, palpitations, orthopnea, leg swelling and PND.  Gastrointestinal: Negative.  Negative for nausea, vomiting, abdominal pain, diarrhea, constipation and blood in stool.  Endocrine: Negative.   Genitourinary: Negative.   Musculoskeletal: Positive for myalgias and arthralgias. Negative for back pain and neck pain.  Skin: Negative.  Negative for rash.  Allergic/Immunologic: Negative.   Neurological: Negative.  Negative for headaches.  Hematological: Negative.  Negative for adenopathy. Does not bruise/bleed easily.  Psychiatric/Behavioral: Negative.        Objective:   Physical Exam  Constitutional: She is oriented to person, place, and time. She appears well-developed and well-nourished. No distress.  HENT:  Head: Normocephalic and atraumatic.  Mouth/Throat: Oropharynx is clear and moist. No oropharyngeal exudate.  Eyes: Conjunctivae are normal. Right  eye exhibits no discharge. Left eye exhibits no discharge. No scleral icterus.  Neck: Normal range of motion. Neck supple. No JVD present. No tracheal deviation present. No thyromegaly present.  Cardiovascular: Normal rate, regular rhythm, S1 normal, S2 normal and intact distal pulses.  Exam reveals no gallop.   Murmur heard.  Decrescendo systolic murmur is present with a grade of 1/6   No diastolic murmur is present  Pulmonary/Chest: Effort normal and breath sounds normal. No stridor. No respiratory distress. She has no wheezes. She has no rales. She exhibits no tenderness.  Abdominal: Soft. Bowel sounds are normal. She exhibits no distension and no mass. There is no tenderness. There is no rebound and no guarding.  Musculoskeletal: Normal range of motion. She exhibits no edema or tenderness.  Lymphadenopathy:    She has no cervical adenopathy.  Neurological: She is oriented to person, place, and time.  Skin: Skin is warm and dry. No rash noted. She is not diaphoretic. No erythema. No pallor.  Vitals reviewed.     Lab Results  Component Value Date   WBC 9.6 04/08/2013   HGB 12.2 04/08/2013   HCT 37.1 04/08/2013   PLT 232.0 04/08/2013   GLUCOSE 89 08/25/2013   CHOL 164 04/08/2013   TRIG 111.0 04/08/2013   HDL 55.00 04/08/2013   LDLDIRECT 201.5 07/05/2010   LDLCALC 87 04/08/2013   ALT 16 03/31/2012   AST 20 03/31/2012   NA 135 08/25/2013   K 4.1 08/25/2013   CL 101 08/25/2013   CREATININE 0.9 08/25/2013   BUN 23 08/25/2013   CO2 28 08/25/2013   TSH 3.03 04/08/2013   INR 0.94 10/02/2011   HGBA1C 6.0 03/31/2012      Assessment & Plan:

## 2013-12-23 NOTE — Progress Notes (Signed)
Pre visit review using our clinic review tool, if applicable. No additional management support is needed unless otherwise documented below in the visit note. 

## 2013-12-23 NOTE — Patient Instructions (Signed)

## 2013-12-24 ENCOUNTER — Telehealth: Payer: Self-pay | Admitting: Internal Medicine

## 2013-12-24 NOTE — Telephone Encounter (Signed)
Notified pt with md response.../lmb 

## 2013-12-24 NOTE — Assessment & Plan Note (Signed)
Her BP is well controlled I will monitor her lytes and renal function 

## 2013-12-24 NOTE — Telephone Encounter (Signed)
Her potassium level was pretty low but the other labs were ok  T. Yetta BarreJones

## 2013-12-24 NOTE — Telephone Encounter (Signed)
Is requesting results of lab results

## 2013-12-24 NOTE — Assessment & Plan Note (Signed)
Her K+ level is low She has not been complaint with the K+ replacement therapy I have asked her to restart the K+

## 2013-12-24 NOTE — Assessment & Plan Note (Signed)
Will cont norco as needed for pain 

## 2014-02-23 NOTE — Progress Notes (Signed)
HPI: FU CAD. Patient previously had a Myoview preop prior to knee surgery that showed a subtle reversible defect in the apical anterior wall superimposed on a small fixed defect in the anteroseptal wall of the mid ventricle and apex. This was suggestive of prior infarct and very mild ischemia in the anterior and apex. There is also felt to be a prior infarct in the inferior lateral wall. Ejection fraction 65%. Echocardiogram in September of 2013 showed normal LV function, mild to moderate left ventricular hypertrophy, mild left atrial enlargement and mild mitral regurgitation. Patient then had cardiac cath in Sept 2013. This revealed a 20% left main. The LAD has a proximal 50% lesion and there is diffuse 70% stenosis in the first diagonal. The second diagonal had an 80% proximal lesion. The mid LAD had an 80% lesion. The septals were large and supplied collaterals to the right coronary artery. The circumflex was severely diseased with a proximal diffuse 75% lesion and a totally occluded after the first marginal. The second marginal was totally occluded and filled via left to left collaterals. The right coronary was totally occluded. Ejection fraction was normal. PCI was not an option. We discussed CABG versus medical therapy and patient elected medical therapy. Since I last saw her,   Current Outpatient Prescriptions  Medication Sig Dispense Refill  . atorvastatin (LIPITOR) 40 MG tablet TAKE 1 TABLET BY MOUTH EVERY DAY 90 tablet 3  . b complex vitamins tablet Take 1 tablet by mouth daily.     . bisacodyl (DULCOLAX) 5 MG EC tablet Take 5 mg by mouth daily as needed. For constipation.    . Cholecalciferol (VITAMIN D3) 1000 UNITS CAPS Take 1,000 Units by mouth daily.     . diclofenac sodium (VOLTAREN) 1 % GEL Apply 2 g topically 4 (four) times daily.    Marland Kitchen. HYDROcodone-acetaminophen (NORCO/VICODIN) 5-325 MG per tablet Take 1 tablet by mouth every 6 (six) hours as needed for moderate pain. 90 tablet 0    . polyethylene glycol powder (MIRALAX) powder Take 1 Container by mouth once.    . potassium chloride SA (K-DUR,KLOR-CON) 20 MEQ tablet Take 1 tablet (20 mEq total) by mouth 2 (two) times daily. 60 tablet 11  . TRIBENZOR 40-5-12.5 MG TABS TAKE 1 TABLET BY MOUTH DAILY 90 tablet 3  . VOLTAREN 1 % GEL APPLY TOPICALLY FOUR TIMES DAILY 100 g 11   No current facility-administered medications for this visit.     Past Medical History  Diagnosis Date  . Hyperlipidemia   . Osteoporosis   . Arthritis   . Eczema   . Cataracts, bilateral   . Hypertension     primary Dr. Sanda Lingerhomas Jones [phone], saw Dr. Jens Somrenshaw x 1 cardio  . CAD (coronary artery disease)     Past Surgical History  Procedure Laterality Date  . Abdominal hysterectomy    . Fracture surgery      shoulder surgery 2008  . Lipoma excision      2011  . Total knee arthroplasty  09/27/2011    Procedure: TOTAL KNEE ARTHROPLASTY;  Surgeon: Thera FlakeW D Caffrey Jr., MD;  Location: [facility] OR;  Service: Orthopedics;  Laterality: Right;  right total knee arthroplasty    History   Social History  . Marital Status: Widowed    Spouse Name: N/A    Number of Children: N/A  . Years of Education: N/A   Occupational History  . Not on file.   Social History Main Topics  .  Smoking status: Never Smoker   . Smokeless tobacco: Never Used  . Alcohol Use: No  . Drug Use: No  . Sexual Activity: Not Currently   Other Topics Concern  . Not on file   Social History Narrative   Caffine drinks-Yes   Seat belt use-Yes   Regular exercise-Yes   Smoke alarm in home-Yes   Guns/firearms in home-NO   History of physical abuse-NO    ROS: no fevers or chills, productive cough, hemoptysis, dysphasia, odynophagia, melena, hematochezia, dysuria, hematuria, rash, seizure activity, orthopnea, PND, pedal edema, claudication. Remaining systems are negative.  Physical Exam: Well-developed well-nourished in no acute distress.  Skin is warm and dry.  HEENT is  normal.  Neck is supple.  Chest is clear to auscultation with normal expansion.  Cardiovascular exam is regular rate and rhythm.  Abdominal exam nontender or distended. No masses palpated. Extremities show no edema. neuro grossly intact  ECG     This encounter was created in error - please disregard.

## 2014-02-24 ENCOUNTER — Encounter: Payer: Medicare Other | Admitting: Cardiology

## 2014-03-14 ENCOUNTER — Telehealth: Payer: Self-pay | Admitting: Internal Medicine

## 2014-03-14 DIAGNOSIS — M170 Bilateral primary osteoarthritis of knee: Secondary | ICD-10-CM

## 2014-03-14 MED ORDER — HYDROCODONE-ACETAMINOPHEN 5-325 MG PO TABS: 1.0000 | ORAL_TABLET | Freq: Four times a day (QID) | ORAL | Status: DC | PRN

## 2014-03-14 NOTE — Telephone Encounter (Signed)
Is requesting refill on hydrocodone °

## 2014-03-14 NOTE — Telephone Encounter (Signed)
Last refill and OV 12.3.15.  Please advise refill

## 2014-03-14 NOTE — Telephone Encounter (Signed)
done

## 2014-03-15 NOTE — Telephone Encounter (Signed)
LVM that request is ready for pick up at the office.

## 2014-03-21 ENCOUNTER — Other Ambulatory Visit: Payer: Self-pay | Admitting: Internal Medicine

## 2014-03-31 ENCOUNTER — Telehealth: Payer: Self-pay | Admitting: Internal Medicine

## 2014-03-31 DIAGNOSIS — M170 Bilateral primary osteoarthritis of knee: Secondary | ICD-10-CM

## 2014-03-31 MED ORDER — OXYCODONE-ACETAMINOPHEN 7.5-325 MG PO TABS: 1.0000 | ORAL_TABLET | Freq: Three times a day (TID) | ORAL | Status: DC | PRN

## 2014-03-31 NOTE — Telephone Encounter (Signed)
Patient is requesting to change traMADol (ULTRAM) 50 MG tablet [[id number]][[id number]] and HYDROcodone-acetaminophen (NORCO/VICODIN) 5-325 MG per tablet [[id number]][[id number]] . She states that she is in severe pain and these drugs are not helping to relieve it. She is asking if she can have an increase in dosage or frequency taken.

## 2014-03-31 NOTE — Telephone Encounter (Signed)
Pt notified per MD 

## 2014-03-31 NOTE — Telephone Encounter (Signed)
Change to percocet She will have to come pick up the prescription

## 2014-04-01 ENCOUNTER — Telehealth: Payer: Self-pay | Admitting: Internal Medicine

## 2014-04-01 NOTE — Telephone Encounter (Signed)
Pt daughter came in to pickup Percocet, she was wondering about pain med that her mother is on. Pt's daughter stated Mrs. Virginia Edwards is on tramadol and Vicodin-from the knee doctor, now these two med is not helping her pain at all. Pt's daughter was wondering if she can take both tramadol and percocet or does she need to stop tramadol. Please advise,

## 2014-04-01 NOTE — Telephone Encounter (Signed)
Notified pt daughter with md response.../lmb 

## 2014-04-01 NOTE — Telephone Encounter (Signed)
States Dr.  Yetta BarreJones changed her pain meds today.  States this is not working.  Is requesting that she possible be put on a steroid.

## 2014-04-01 NOTE — Telephone Encounter (Signed)
Stop tramadol and vicodin Try percocet alone for the pain

## 2014-04-04 ENCOUNTER — Telehealth: Payer: Self-pay | Admitting: Internal Medicine

## 2014-04-04 ENCOUNTER — Other Ambulatory Visit: Payer: Self-pay | Admitting: Internal Medicine

## 2014-04-04 MED ORDER — TRAMADOL HCL 50 MG PO TABS: 50.0000 mg | ORAL_TABLET | Freq: Four times a day (QID) | ORAL | Status: DC | PRN

## 2014-04-04 NOTE — Telephone Encounter (Signed)
done

## 2014-04-04 NOTE — Telephone Encounter (Signed)
Pt called said legs burn, red feet, Numb spot on her leg   Acetaminophen   Pt would like a call back

## 2014-04-04 NOTE — Telephone Encounter (Signed)
Rx for tramadol called in to pt pharmacy [facility] [place]//pt notified.

## 2014-04-04 NOTE — Telephone Encounter (Signed)
Patient Name: Virginia RegulusJANET Edwards DOB: 06/16/1925 Initial Comment Caller states RX meds not helping; has arthritis in knees and in pain; ; Nurse Assessment Nurse: Charna Elizabethrumbull, RN, Cathy Date/Time (Eastern Time): 04/04/2014 12:56:23 PM Confirm and document reason for call. If symptomatic, describe symptoms. ---Caller states she developing increased Arthritis pain in her knees again about 5 months ago. No swelling. No fever. No new injury in the past 3 days. She would like for the MD to change her pain medications. Has the patient traveled out of the country within the last 30 days? ---No Does the patient require triage? ---Yes Related visit to physician within the last 2 weeks? ---No Does the PT have any chronic conditions? (i.e. diabetes, asthma, etc.) ---Yes List chronic conditions. ---Arthritis, High Blood Pressure Guidelines Guideline Title Affirmed Question Affirmed Notes Knee Pain [1] Thigh or calf pain AND [2] only 1 side AND [3] present > 1 hour Final Disposition User See Physician within 4 Hours (or PCP triage) Charna Elizabethrumbull, RN, Cathy Comments Caller declined the See Within 4 Hours disposition. Reinforced the See MD Within 4 Hour disposition and caller declined sternly. She requests that I send a message to Dr. Yetta BarreJones to see if he will call in Tramadol. She states she has an appointment with Dr. Yetta BarreJones on Thursday and doesn't want to go in before then. Called the office backline and notified Amalia HaileyDustin who will notify MD.

## 2014-04-04 NOTE — Telephone Encounter (Signed)
Patient spoke with [facility]. States that she did not sleep last night due to a new and increased pain in her leg. She refuses to be seen earlier than [id number] against Cathy's advice. She is requesting a refill of   traMADol (ULTRAM) 50 MG tablet [[id number]][[id number]]     To be called in to her pharmacy.

## 2014-04-04 NOTE — Telephone Encounter (Signed)
See other phone note

## 2014-04-04 NOTE — Addendum Note (Signed)
Addended by: Rock NephewARCHIE, Tuana Hoheisel T on: 04/04/2014 03:16 PM   Modules accepted: Orders, Medications

## 2014-04-06 ENCOUNTER — Telehealth: Payer: Self-pay | Admitting: *Deleted

## 2014-04-06 NOTE — Telephone Encounter (Signed)
[facility] - Client TELEPHONE ADVICE RECORD [facility] Patient Name: Virginia RegulusJANET Pietsch Gender: Female DOB: 08/15/1925 Age: [age] Y 8 M 13 D Return Phone Number: [phone] (Primary) Address: [address] City[place]State[place]Zip: [place] [address] Client [facility] - Client Client Site [facility] Day Physician Sanda LingerJones, Thomas Contact Type Call Call Type Triage / Clinical Relationship To Patient Self Appointment Disposition EMR Appointment Attempted - Not Scheduled Return Phone Number [phone] (Primary) Chief Complaint Leg Pain Initial Comment Caller states RX meds not helping; has arthritis in knees and in pain; ; PreDisposition Call Doctor Info pasted into Epic Yes Nurse Assessment Nurse: Charna Elizabethrumbull, RN, Lynden Angathy Date/Time (Eastern Time): 04/04/2014 12:56:23 PM Confirm and document reason for call. If symptomatic, describe symptoms. ---Caller states she developing increased Arthritis pain in her knees again about 5 months ago. No swelling. No fever. No new injury in the past 3 days. She would like for the MD to change her pain medications. Has the patient traveled out of the country within the last 30 days? ---No Does the patient require triage? ---Yes Related visit to physician within the last 2 weeks? ---No Does the PT have any chronic conditions? (i.e. diabetes, asthma, etc.) ---Yes List chronic conditions. ---Arthritis, High Blood Pressure Guidelines Guideline Title Affirmed Question Affirmed Notes Nurse Date/Time Lamount Cohen(Eastern Time) Knee Pain [1] Thigh or calf pain AND [2] only 1 side AND [3] present > 1 hour Charna Elizabethrumbull, RN, Lynden AngCathy 04/04/2014 12:59:52 PM Disp. Time Lamount Cohen(Eastern Time) Disposition Final User 04/04/2014 12:31:22 PM Attempt made - no message left Charna Elizabethrumbull, RN, Lynden AngCathy 04/04/2014 12:33:51 PM Attempt made - no message left Charna Elizabethrumbull, RN, Lynden Angathy 04/04/2014 1:03:34 PM See Physician within 4 Hours (or PCP  triage) Yes Trumbull, RN, Lynden Angathy PLEASE NOTE: All timestamps contained within this report are represented as [place] Standard Time. CONFIDENTIALTY NOTICE: This fax transmission is intended only for the addressee. It contains information that is legally privileged, confidential or otherwise protected from use or disclosure. If you are not the intended recipient, you are strictly prohibited from reviewing, disclosing, copying using or disseminating any of this information or taking any action in reliance on or regarding this information. If you have received this fax in error, please notify us immediately by telephone so that we can arrange for its return to us. Phone: [phone], Toll-Free: [phone], Fax: [phone] Page: 2 of 2 Call Id: [id number] Caller Understands: Yes Disagree/Comply: Comply Care Advice Given Per Guideline SEE PHYSICIAN WITHIN 4 HOURS (or PCP triage): * IF NO PCP TRIAGE: You need to be seen. Go to _______________ (ED/UCC or office if it will be open) within the next 3 or 4 hours. Go sooner if you become worse. PAIN MEDICINES: ACETAMINOPHEN (E.G., TYLENOL): CALL EMS IF: Chest pain or shortness of breath occurs. CARE ADVICE given per Knee Pain (Adult) guideline. After Care Instructions Given Call Event Type User Date / Time Description Comments User: Colonel Baldathy, Trumbull, RN Date/Time Lamount Cohen(Eastern Time): 04/04/2014 12:28:30 PM no answer after 10 rings. User: Colonel Baldathy, Trumbull, RN Date/Time Lamount Cohen(Eastern Time): 04/04/2014 1:12:31 PM Caller declined the See Within 4 Hours disposition. Reinforced the See MD Within 4 Hour disposition and caller declined sternly. She requests that I send a message to Dr. Yetta BarreJones to see if he will call in Tramadol. She states she has an appointment with Dr. Yetta BarreJones on Thursday and doesn't want to go in before then. Called the office backline and notified Amalia HaileyDustin who will notify MD.  Referrals GO TO FACILITY REFUSED

## 2014-04-07 ENCOUNTER — Ambulatory Visit: Payer: Medicare Other | Admitting: Internal Medicine

## 2014-04-07 ENCOUNTER — Telehealth: Payer: Self-pay | Admitting: Internal Medicine

## 2014-04-07 NOTE — Telephone Encounter (Signed)
Pt stated that sometime tramadol is not working as helping with her pain as much that's why she wants Avetaminophen to help as needed.

## 2014-04-07 NOTE — Telephone Encounter (Signed)
Pt called requesting Acetaminophen to taken when tramadol is not helping with her pain. Pt request to speak to the assistant concern about this.

## 2014-04-08 ENCOUNTER — Telehealth: Payer: Self-pay | Admitting: Internal Medicine

## 2014-04-08 NOTE — Telephone Encounter (Signed)
Pt called stated tramadol suppose to be 2 tab by mouth every 6 hours but we send in 1 tab by mouth every 6 hours. Please and call pt back

## 2014-04-12 DIAGNOSIS — M170 Bilateral primary osteoarthritis of knee: Secondary | ICD-10-CM | POA: Diagnosis not present

## 2014-04-12 DIAGNOSIS — M25551 Pain in right hip: Secondary | ICD-10-CM | POA: Diagnosis not present

## 2014-04-12 DIAGNOSIS — M545 Low back pain: Secondary | ICD-10-CM | POA: Diagnosis not present

## 2014-04-14 ENCOUNTER — Ambulatory Visit: Payer: Medicare Other | Admitting: Internal Medicine

## 2014-04-14 ENCOUNTER — Encounter: Payer: Self-pay | Admitting: Internal Medicine

## 2014-04-14 ENCOUNTER — Ambulatory Visit (INDEPENDENT_AMBULATORY_CARE_PROVIDER_SITE_OTHER): Payer: Medicare Other | Admitting: Internal Medicine

## 2014-04-14 ENCOUNTER — Other Ambulatory Visit (INDEPENDENT_AMBULATORY_CARE_PROVIDER_SITE_OTHER): Payer: Medicare Other

## 2014-04-14 VITALS — BP 162/84 | HR 82 | Temp 98.2°F | Resp 16 | Ht 62.0 in | Wt 129.0 lb

## 2014-04-14 DIAGNOSIS — E876 Hypokalemia: Secondary | ICD-10-CM

## 2014-04-14 DIAGNOSIS — I10 Essential (primary) hypertension: Secondary | ICD-10-CM | POA: Diagnosis not present

## 2014-04-14 DIAGNOSIS — Z23 Encounter for immunization: Secondary | ICD-10-CM

## 2014-04-14 DIAGNOSIS — M170 Bilateral primary osteoarthritis of knee: Secondary | ICD-10-CM | POA: Diagnosis not present

## 2014-04-14 LAB — BASIC METABOLIC PANEL
BUN: 22 mg/dL (ref 6–23)
CHLORIDE: 102 meq/L (ref 96–112)
CO2: 29 mEq/L (ref 19–32)
CREATININE: 0.81 mg/dL (ref 0.40–1.20)
Calcium: 10.1 mg/dL (ref 8.4–10.5)
GFR: 71.13 mL/min (ref >60.00)
GLUCOSE: 92 mg/dL (ref 70–99)
Potassium: 3.9 mEq/L (ref 3.5–5.1)
Sodium: 137 mEq/L (ref 135–145)

## 2014-04-14 LAB — MAGNESIUM: Magnesium: 2.1 mg/dL (ref 1.5–2.5)

## 2014-04-14 MED ORDER — TRAMADOL HCL 50 MG PO TABS: 100.0000 mg | ORAL_TABLET | Freq: Four times a day (QID) | ORAL | Status: DC | PRN

## 2014-04-14 NOTE — Patient Instructions (Signed)

## 2014-04-14 NOTE — Progress Notes (Signed)
Subjective:    Patient ID: Virginia Edwards, female    DOB: 05-26-1925, 81 y.o.   MRN: [id number]  Hypertension This is a chronic problem. The current episode started more than 1 year ago. The problem is unchanged. The problem is controlled. Associated symptoms include anxiety. Pertinent negatives include no blurred vision, chest pain, headaches, malaise/fatigue, neck pain, orthopnea, palpitations, peripheral edema, PND, shortness of breath or sweats. There are no associated agents to hypertension. Past treatments include angiotensin blockers, calcium channel blockers and diuretics. The current treatment provides moderate improvement. Compliance problems include exercise and diet.       Review of Systems  Constitutional: Negative.  Negative for fever, chills, malaise/fatigue, diaphoresis, appetite change and fatigue.  HENT: Negative.   Eyes: Negative.  Negative for blurred vision.  Respiratory: Negative.  Negative for cough, choking, chest tightness, shortness of breath and stridor.   Cardiovascular: Negative.  Negative for chest pain, palpitations, orthopnea, leg swelling and PND.  Gastrointestinal: Negative.  Negative for abdominal pain.  Endocrine: Negative.   Genitourinary: Negative.   Musculoskeletal: Positive for arthralgias. Negative for myalgias, back pain, joint swelling and neck pain.  Allergic/Immunologic: Negative.   Neurological: Negative.  Negative for dizziness, tremors, syncope, light-headedness and headaches.  Hematological: Negative.  Negative for adenopathy. Does not bruise/bleed easily.  Psychiatric/Behavioral: Positive for sleep disturbance. Negative for suicidal ideas, hallucinations, behavioral problems, confusion, self-injury, dysphoric mood, decreased concentration and agitation. The patient is nervous/anxious. The patient is not hyperactive.        Objective:   Physical Exam  Constitutional: She is oriented to person, place, and time. She appears well-developed and  well-nourished. No distress.  HENT:  Head: Normocephalic and atraumatic.  Mouth/Throat: Oropharynx is clear and moist. No oropharyngeal exudate.  Eyes: Conjunctivae are normal. Right eye exhibits no discharge. Left eye exhibits no discharge. No scleral icterus.  Neck: Normal range of motion. Neck supple. No JVD present. No tracheal deviation present. No thyromegaly present.  Cardiovascular: Normal rate, regular rhythm, normal heart sounds and intact distal pulses.  Exam reveals no gallop and no friction rub.   No murmur heard. Pulmonary/Chest: Effort normal and breath sounds normal. No stridor. No respiratory distress. She has no wheezes. She has no rales. She exhibits no tenderness.  Abdominal: Soft. Bowel sounds are normal. She exhibits no distension and no mass. There is no tenderness. There is no rebound and no guarding.  Musculoskeletal: Normal range of motion. She exhibits no edema or tenderness.       Right knee: She exhibits deformity. She exhibits normal range of motion, no swelling, no effusion and no erythema. No tenderness found.       Left knee: She exhibits deformity. She exhibits normal range of motion, no swelling, no effusion and no erythema. No tenderness found.  There is mild crepitance in both knees  Lymphadenopathy:    She has no cervical adenopathy.  Neurological: She is oriented to person, place, and time.  Skin: Skin is warm and dry. No rash noted. She is not diaphoretic. No erythema. No pallor.  Vitals reviewed.    Lab Results  Component Value Date   WBC 9.6 04/08/2013   HGB 12.2 04/08/2013   HCT 37.1 04/08/2013   PLT 232.0 04/08/2013   GLUCOSE 92 12/23/2013   CHOL 164 04/08/2013   TRIG 111.0 04/08/2013   HDL 55.00 04/08/2013   LDLDIRECT 201.5 07/05/2010   LDLCALC 87 04/08/2013   ALT 16 03/31/2012   AST 20 03/31/2012   NA 138  12/23/2013   K 3.0* 12/23/2013   CL 103 12/23/2013   CREATININE 0.8 12/23/2013   BUN 15 12/23/2013   CO2 26 12/23/2013   TSH  3.03 04/08/2013   INR 0.94 10/02/2011   HGBA1C 6.0 03/31/2012       Assessment & Plan:

## 2014-04-18 ENCOUNTER — Telehealth: Payer: Self-pay | Admitting: Cardiology

## 2014-04-18 NOTE — Assessment & Plan Note (Signed)
This is caused by the HCTZ Improvement noted Will cont the K+ replacement therapy

## 2014-04-18 NOTE — Assessment & Plan Note (Signed)
Her BP is adequately well controlled Lytes and renal function are stable 

## 2014-04-18 NOTE — Assessment & Plan Note (Signed)
She did not like the way percocet made her feel Will cont tramadol

## 2014-04-19 DIAGNOSIS — M545 Low back pain: Secondary | ICD-10-CM | POA: Diagnosis not present

## 2014-04-19 NOTE — Telephone Encounter (Signed)
Close encounter 

## 2014-04-21 ENCOUNTER — Telehealth: Payer: Self-pay | Admitting: Internal Medicine

## 2014-04-21 DIAGNOSIS — M545 Low back pain: Secondary | ICD-10-CM | POA: Diagnosis not present

## 2014-04-21 NOTE — Telephone Encounter (Signed)
Please keep taking the potassium pills

## 2014-04-21 NOTE — Telephone Encounter (Signed)
Left detialed message on her machine.

## 2014-04-21 NOTE — Telephone Encounter (Signed)
Spoke to pt and gave lab results.   Pt would like to know if she could stop her k pill since labs were normal   *can leave a detailed message on machine

## 2014-04-21 NOTE — Telephone Encounter (Signed)
Pt called in and would like a call back with her lab results for blood work last week.     Best number [phone]

## 2014-04-22 ENCOUNTER — Telehealth: Payer: Self-pay | Admitting: Internal Medicine

## 2014-04-22 NOTE — Telephone Encounter (Signed)
Left message on daughters voice mail to call me about what question she has about mother taking tramodol---tried calling mother's (Virginia Edwards's home phone number) and only got voice mail

## 2014-04-22 NOTE — Telephone Encounter (Signed)
They have questions about the dosage of traMADol (ULTRAM) 50 MG tablet [[id number]. Please call asap

## 2014-04-25 NOTE — Telephone Encounter (Signed)
Patient states she has found the recent script for tramadol

## 2014-05-04 NOTE — Progress Notes (Signed)
HPI: FU CAD. Patient previously had a Myoview preop prior to knee surgery that showed a subtle reversible defect in the apical anterior wall superimposed on a small fixed defect in the anteroseptal wall of the mid ventricle and apex. This was suggestive of prior infarct and very mild ischemia in the anterior and apex. There is also felt to be a prior infarct in the inferior lateral wall. Ejection fraction 65%. Echocardiogram in September of 2013 showed normal LV function, mild to moderate left ventricular hypertrophy, mild left atrial enlargement and mild mitral regurgitation. Patient then had cardiac cath in Sept 2013. This revealed a 20% left main. The LAD has a proximal 50% lesion and there is diffuse 70% stenosis in the first diagonal. The second diagonal had an 80% proximal lesion. The mid LAD had an 80% lesion. The septals were large and supplied collaterals to the right coronary artery. The circumflex was severely diseased with a proximal diffuse 75% lesion and a totally occluded after the first marginal. The second marginal was totally occluded and filled via left to left collaterals. The right coronary was totally occluded. Ejection fraction was normal. PCI was not an option. We discussed CABG versus medical therapy and patient elected medical therapy. Since I last saw her, she denies dyspnea, chest pain, palpitations or syncope. No pedal edema.  Current Outpatient Prescriptions  Medication Sig Dispense Refill  . atorvastatin (LIPITOR) 40 MG tablet TAKE 1 TABLET BY MOUTH EVERY DAY 90 tablet 3  . b complex vitamins tablet Take 1 tablet by mouth daily.     . Cholecalciferol (VITAMIN D3) 1000 UNITS CAPS Take 1,000 Units by mouth daily.     . diclofenac sodium (VOLTAREN) 1 % GEL Apply 2 g topically 4 (four) times daily.    . polyethylene glycol powder (MIRALAX) powder Take 1 Container by mouth once.    . potassium chloride SA (K-DUR,KLOR-CON) 20 MEQ tablet Take 1 tablet (20 mEq total) by  mouth 2 (two) times daily. 60 tablet 11  . traMADol (ULTRAM) 50 MG tablet Take 2 tablets (100 mg total) by mouth every 6 (six) hours as needed. 120 tablet 5  . TRIBENZOR 40-5-12.5 MG TABS TAKE 1 TABLET BY MOUTH DAILY 90 tablet 3  . VOLTAREN 1 % GEL APPLY TOPICALLY FOUR TIMES DAILY 100 g 11   No current facility-administered medications for this visit.     Past Medical History  Diagnosis Date  . Hyperlipidemia   . Osteoporosis   . Arthritis   . Eczema   . Cataracts, bilateral   . Hypertension     primary Dr. Sanda Lingerhomas Jones [phone], saw Dr. Jens Somrenshaw x 1 cardio  . CAD (coronary artery disease)     Past Surgical History  Procedure Laterality Date  . Abdominal hysterectomy    . Fracture surgery      shoulder surgery 2008  . Lipoma excision      2011  . Total knee arthroplasty  09/27/2011    Procedure: TOTAL KNEE ARTHROPLASTY;  Surgeon: Thera FlakeW D Caffrey Jr., MD;  Location: [facility] OR;  Service: Orthopedics;  Laterality: Right;  right total knee arthroplasty    History   Social History  . Marital Status: Widowed    Spouse Name: N/A  . Number of Children: N/A  . Years of Education: N/A   Occupational History  . Not on file.   Social History Main Topics  . Smoking status: Never Smoker   . Smokeless tobacco: Never Used  .  Alcohol Use: No  . Drug Use: No  . Sexual Activity: Not Currently   Other Topics Concern  . Not on file   Social History Narrative   Caffine drinks-Yes   Seat belt use-Yes   Regular exercise-Yes   Smoke alarm in home-Yes   Guns/firearms in home-NO   History of physical abuse-NO    ROS: patient has problems with back pain and severe right knee pain but no fevers or chills, productive cough, hemoptysis, dysphasia, odynophagia, melena, hematochezia, dysuria, hematuria, rash, seizure activity, orthopnea, PND, pedal edema, claudication. Remaining systems are negative.  Physical Exam: Well-developed, well nourished in no acute distress.  Skin is warm and  dry.  HEENT is normal.  Neck is supple.  Chest is clear to auscultation with normal expansion.  Cardiovascular exam is regular rate and rhythm.  Abdominal exam nontender or distended. No masses palpated. Extremities show no edema. neuro grossly intact  ECG sinus rhythm with no ST changes. Cannot rule out prior septal infarct.

## 2014-05-06 ENCOUNTER — Encounter: Payer: Self-pay | Admitting: Cardiology

## 2014-05-06 ENCOUNTER — Ambulatory Visit (INDEPENDENT_AMBULATORY_CARE_PROVIDER_SITE_OTHER): Payer: Medicare Other | Admitting: Cardiology

## 2014-05-06 VITALS — BP 140/60 | HR 78 | Ht 62.0 in | Wt 127.8 lb

## 2014-05-06 DIAGNOSIS — I2510 Atherosclerotic heart disease of native coronary artery without angina pectoris: Secondary | ICD-10-CM | POA: Diagnosis not present

## 2014-05-06 DIAGNOSIS — E785 Hyperlipidemia, unspecified: Secondary | ICD-10-CM | POA: Diagnosis not present

## 2014-05-06 DIAGNOSIS — I10 Essential (primary) hypertension: Secondary | ICD-10-CM

## 2014-05-06 DIAGNOSIS — I2583 Coronary atherosclerosis due to lipid rich plaque: Principal | ICD-10-CM

## 2014-05-06 NOTE — Patient Instructions (Signed)
Your physician wants you to follow-up in: 6 MONTHS WITH DR CRENSHAW You will receive a reminder letter in the mail two months in advance. If you don't receive a letter, please call our office to schedule the follow-up appointment.  

## 2014-05-06 NOTE — Assessment & Plan Note (Signed)
Blood pressure controlled. Continue present medications. 

## 2014-05-06 NOTE — Assessment & Plan Note (Signed)
Continue statin. 

## 2014-05-06 NOTE — Assessment & Plan Note (Addendum)
Patient known to have severe coronary disease on previous cardiac catheterization with preserved LV function. Based on anatomy she is not a candidate for PCI. She has considered coronary artery bypass and graft versus medical therapy. Given her age she would be high risk for surgery. She would prefer conservative measures. Plan continue aspirin and statin. She did not tolerate beta blockade. She is not having chest pain. Given the severity of her coronary artery disease she would be high risk for any surgery. She does have significant knee arthralgias. Her orthopedist apparently feels she may could have knee replacement with an epidural. This would certainly be lower risk than general anesthesia but she would still have risk with any surgical procedure. She states she would be willing to accept that risk because her knee pain is severe. As long as everyone understands there is risk she could proceed. 

## 2014-05-16 DIAGNOSIS — M5116 Intervertebral disc disorders with radiculopathy, lumbar region: Secondary | ICD-10-CM | POA: Diagnosis not present

## 2014-05-16 DIAGNOSIS — M5136 Other intervertebral disc degeneration, lumbar region: Secondary | ICD-10-CM | POA: Diagnosis not present

## 2014-05-19 DIAGNOSIS — M5116 Intervertebral disc disorders with radiculopathy, lumbar region: Secondary | ICD-10-CM | POA: Diagnosis not present

## 2014-05-19 DIAGNOSIS — M545 Low back pain: Secondary | ICD-10-CM | POA: Diagnosis not present

## 2014-06-09 DIAGNOSIS — M5116 Intervertebral disc disorders with radiculopathy, lumbar region: Secondary | ICD-10-CM | POA: Diagnosis not present

## 2014-06-09 DIAGNOSIS — M545 Low back pain: Secondary | ICD-10-CM | POA: Diagnosis not present

## 2014-06-09 DIAGNOSIS — M5136 Other intervertebral disc degeneration, lumbar region: Secondary | ICD-10-CM | POA: Diagnosis not present

## 2014-08-12 DIAGNOSIS — H25813 Combined forms of age-related cataract, bilateral: Secondary | ICD-10-CM | POA: Diagnosis not present

## 2014-08-25 DIAGNOSIS — M5116 Intervertebral disc disorders with radiculopathy, lumbar region: Secondary | ICD-10-CM | POA: Diagnosis not present

## 2014-08-25 DIAGNOSIS — M170 Bilateral primary osteoarthritis of knee: Secondary | ICD-10-CM | POA: Diagnosis not present

## 2014-08-28 ENCOUNTER — Other Ambulatory Visit: Payer: Self-pay | Admitting: Internal Medicine

## 2014-08-29 ENCOUNTER — Other Ambulatory Visit: Payer: Self-pay

## 2014-08-29 DIAGNOSIS — H25811 Combined forms of age-related cataract, right eye: Secondary | ICD-10-CM | POA: Diagnosis not present

## 2014-08-29 DIAGNOSIS — H25812 Combined forms of age-related cataract, left eye: Secondary | ICD-10-CM | POA: Diagnosis not present

## 2014-08-29 NOTE — Telephone Encounter (Signed)
Received pharmacy rejection stating that insurance will not cover diclofenac gel  without a prior authorization. PA stated via covermymeds, decision now pending their approval.

## 2014-09-02 ENCOUNTER — Telehealth: Payer: Self-pay

## 2014-09-02 NOTE — Telephone Encounter (Signed)
Pa initiated for Diclofenac 1% gel KEY: [id number]

## 2014-09-02 NOTE — Telephone Encounter (Signed)
New form being used KEY: [id number]

## 2014-09-09 NOTE — Telephone Encounter (Signed)
PA approved through 09/02/2015

## 2014-09-16 ENCOUNTER — Other Ambulatory Visit: Payer: Self-pay | Admitting: Internal Medicine

## 2014-09-22 ENCOUNTER — Ambulatory Visit (INDEPENDENT_AMBULATORY_CARE_PROVIDER_SITE_OTHER): Payer: Medicare Other | Admitting: Internal Medicine

## 2014-09-22 ENCOUNTER — Other Ambulatory Visit (INDEPENDENT_AMBULATORY_CARE_PROVIDER_SITE_OTHER): Payer: Medicare Other

## 2014-09-22 ENCOUNTER — Encounter: Payer: Self-pay | Admitting: Internal Medicine

## 2014-09-22 VITALS — BP 150/74 | HR 80 | Temp 97.9°F | Resp 16 | Ht 62.0 in | Wt 133.0 lb

## 2014-09-22 DIAGNOSIS — E876 Hypokalemia: Secondary | ICD-10-CM

## 2014-09-22 DIAGNOSIS — M170 Bilateral primary osteoarthritis of knee: Secondary | ICD-10-CM

## 2014-09-22 DIAGNOSIS — R739 Hyperglycemia, unspecified: Secondary | ICD-10-CM

## 2014-09-22 DIAGNOSIS — E785 Hyperlipidemia, unspecified: Secondary | ICD-10-CM

## 2014-09-22 DIAGNOSIS — I10 Essential (primary) hypertension: Secondary | ICD-10-CM | POA: Diagnosis not present

## 2014-09-22 DIAGNOSIS — Z23 Encounter for immunization: Secondary | ICD-10-CM | POA: Diagnosis not present

## 2014-09-22 DIAGNOSIS — I2510 Atherosclerotic heart disease of native coronary artery without angina pectoris: Secondary | ICD-10-CM | POA: Diagnosis not present

## 2014-09-22 LAB — CBC WITH DIFFERENTIAL/PLATELET
BASOS PCT: 0.3 % (ref 0.0–3.0)
Basophils Absolute: 0 10*3/uL (ref 0.0–0.1)
EOS ABS: 0 10*3/uL (ref 0.0–0.7)
EOS PCT: 0.6 % (ref 0.0–5.0)
HEMATOCRIT: 37.7 % (ref 36.0–46.0)
HEMOGLOBIN: 12.5 g/dL (ref 12.0–15.0)
LYMPHS PCT: 18.8 % (ref 12.0–46.0)
Lymphs Abs: 1.3 10*3/uL (ref 0.7–4.0)
MCHC: 33.1 g/dL (ref 30.0–36.0)
MCV: 90.5 fl (ref 78.0–100.0)
MONOS PCT: 5.7 % (ref 3.0–12.0)
Monocytes Absolute: 0.4 10*3/uL (ref 0.1–1.0)
NEUTROS ABS: 5.3 10*3/uL (ref 1.4–7.7)
Neutrophils Relative %: 74.6 % (ref 43.0–77.0)
PLATELETS: 192 10*3/uL (ref 150.0–400.0)
RBC: 4.16 Mil/uL (ref 3.87–5.11)
RDW: 13.1 % (ref 11.5–15.5)
WBC: 7.2 10*3/uL (ref 4.0–10.5)

## 2014-09-22 LAB — LIPID PANEL
CHOLESTEROL: 194 mg/dL (ref 0–200)
HDL: 59.3 mg/dL (ref >39.00)
LDL Cholesterol: 119 mg/dL — ABNORMAL HIGH (ref 0–99)
NonHDL: 134.29
Total CHOL/HDL Ratio: 3
Triglycerides: 76 mg/dL (ref 0.0–149.0)
VLDL: 15.2 mg/dL (ref 0.0–40.0)

## 2014-09-22 LAB — BASIC METABOLIC PANEL
BUN: 22 mg/dL (ref 6–23)
CALCIUM: 10.1 mg/dL (ref 8.4–10.5)
CHLORIDE: 105 meq/L (ref 96–112)
CO2: 27 meq/L (ref 19–32)
Creatinine, Ser: 0.78 mg/dL (ref 0.40–1.20)
GFR: 74.22 mL/min (ref >60.00)
GLUCOSE: 88 mg/dL (ref 70–99)
Potassium: 3.8 mEq/L (ref 3.5–5.1)
SODIUM: 141 meq/L (ref 135–145)

## 2014-09-22 LAB — HEMOGLOBIN A1C: Hgb A1c MFr Bld: 5.8 % (ref 4.6–6.5)

## 2014-09-22 LAB — TSH: TSH: 2.53 u[IU]/mL (ref 0.35–4.50)

## 2014-09-22 NOTE — Patient Instructions (Signed)

## 2014-09-22 NOTE — Progress Notes (Signed)
Subjective:  Patient ID: Virginia Edwards, female    DOB: May 15, 1925  Age: 81 y.o. MRN: [id number]  CC: Hypertension and Hyperlipidemia   HPI Virginia Edwards presents for follow-up on hypertension, hyperlipidemia, and coronary artery disease. She tells me that she feels well and offers no new or different complaints. Since I last saw her she tells me she has had a nerve block done in her lower back for pain and is feeling better. She still complains of bilateral knee discomfort.   Outpatient Prescriptions Prior to Visit  Medication Sig Dispense Refill  . atorvastatin (LIPITOR) 40 MG tablet TAKE 1 TABLET BY MOUTH EVERY DAY 90 tablet 3  . b complex vitamins tablet Take 1 tablet by mouth daily.     . Cholecalciferol (VITAMIN D3) 1000 UNITS CAPS Take 1,000 Units by mouth daily.     . diclofenac sodium (VOLTAREN) 1 % GEL Apply 2 g topically 4 (four) times daily.    . diclofenac sodium (VOLTAREN) 1 % GEL APPLY TOPICALLY FOUR TIMES DAILY 100 g 11  . polyethylene glycol powder (MIRALAX) powder Take 1 Container by mouth once.    . potassium chloride SA (K-DUR,KLOR-CON) 20 MEQ tablet Take 1 tablet (20 mEq total) by mouth 2 (two) times daily. 60 tablet 11  . traMADol (ULTRAM) 50 MG tablet TAKE 2 TABLETS BY MOUTH EVERY 6 HOURS AS NEEDED 120 tablet 5  . TRIBENZOR 40-5-12.5 MG TABS TAKE 1 TABLET BY MOUTH DAILY 90 tablet 3   No facility-administered medications prior to visit.    ROS Review of Systems  Constitutional: Negative.  Negative for fever, chills, diaphoresis, appetite change and fatigue.  HENT: Negative.   Eyes: Negative.   Respiratory: Negative.  Negative for cough, choking, chest tightness, shortness of breath and stridor.   Cardiovascular: Negative.  Negative for chest pain, palpitations and leg swelling.  Gastrointestinal: Negative.  Negative for nausea, abdominal pain, diarrhea and constipation.  Endocrine: Negative.   Genitourinary: Negative.   Musculoskeletal: Positive for back pain  and arthralgias. Negative for myalgias, joint swelling and neck pain.  Skin: Negative.   Allergic/Immunologic: Negative.   Neurological: Negative.  Negative for dizziness, syncope, weakness and light-headedness.  Hematological: Negative.  Negative for adenopathy. Does not bruise/bleed easily.  Psychiatric/Behavioral: Negative.     Objective:  BP 150/74 mmHg  Pulse 80  Temp(Src) 97.9 F (36.6 C) (Oral)  Resp 16  Ht 5\' 2"  (1.575 m)  Wt 133 lb (60.328 kg)  BMI 24.32 kg/m2  SpO2 95%  BP Readings from Last 3 Encounters:  09/22/14 150/74  05/06/14 140/60  04/14/14 162/84    Wt Readings from Last 3 Encounters:  09/22/14 133 lb (60.328 kg)  05/06/14 127 lb 12.8 oz (57.97 kg)  04/14/14 129 lb (58.514 kg)    Physical Exam  Constitutional: She is oriented to person, place, and time. She appears well-developed and well-nourished. No distress.  HENT:  Head: Normocephalic and atraumatic.  Mouth/Throat: Oropharynx is clear and moist. No oropharyngeal exudate.  Eyes: Conjunctivae are normal. Right eye exhibits no discharge. Left eye exhibits no discharge. No scleral icterus.  Neck: Normal range of motion. Neck supple. No JVD present. No tracheal deviation present. No thyromegaly present.  Cardiovascular: Normal rate, regular rhythm, normal heart sounds and intact distal pulses.  Exam reveals no gallop and no friction rub.   No murmur heard. Pulmonary/Chest: Effort normal and breath sounds normal. No stridor. No respiratory distress. She has no wheezes. She has no rales. She exhibits no tenderness.  Abdominal: Soft. Bowel sounds are normal. She exhibits no distension and no mass. There is no tenderness. There is no rebound and no guarding.  Musculoskeletal: Normal range of motion. She exhibits no edema or tenderness.  Lymphadenopathy:    She has no cervical adenopathy.  Neurological: She is oriented to person, place, and time.  Skin: Skin is warm and dry. No rash noted. She is not  diaphoretic. No erythema. No pallor.  Psychiatric: She has a normal mood and affect. Her behavior is normal. Judgment and thought content normal.  Vitals reviewed.   Lab Results  Component Value Date   WBC 7.2 09/22/2014   HGB 12.5 09/22/2014   HCT 37.7 09/22/2014   PLT 192.0 09/22/2014   GLUCOSE 88 09/22/2014   CHOL 194 09/22/2014   TRIG 76.0 09/22/2014   HDL 59.30 09/22/2014   LDLDIRECT 201.5 07/05/2010   LDLCALC 119* 09/22/2014   ALT 16 03/31/2012   AST 20 03/31/2012   NA 141 09/22/2014   K 3.8 09/22/2014   CL 105 09/22/2014   CREATININE 0.78 09/22/2014   BUN 22 09/22/2014   CO2 27 09/22/2014   TSH 2.53 09/22/2014   INR 0.94 10/02/2011   HGBA1C 5.8 09/22/2014    No results found.  Assessment & Plan:   Parish was seen today for hypertension and hyperlipidemia.  Diagnoses and all orders for this visit:  Coronary artery disease involving native coronary artery of native heart without angina pectoris -     Lipid panel; Future -     ezetimibe (ZETIA) 10 MG tablet; Take 1 tablet (10 mg total) by mouth daily.  Essential hypertension, benign- her blood pressure is adequately well controlled. -     Basic metabolic panel; Future -     CBC with Differential/Platelet; Future  Primary osteoarthritis of both knees  Hyperlipidemia with target LDL less than 100- her LDL is markedly improved on the statin but she is still not achieved her LDL goal of less than 70. I therefore asked her to add Zetia to her statin therapy. -     Lipid panel; Future -     TSH; Future -     ezetimibe (ZETIA) 10 MG tablet; Take 1 tablet (10 mg total) by mouth daily.  Hypokalemia- her potassium level is in the low normal range, I have asked her to continue her potassium replacement therapy. -     Basic metabolic panel; Future  Hyperglycemia- she has prediabetes and will continue to work on her lifestyle modifications. -     Basic metabolic panel; Future -     Hemoglobin A1c; Future  Need for  prophylactic vaccination and inoculation against influenza -     Flu vaccine HIGH DOSE PF   I am having Ms. Bos start on ezetimibe. I am also having her maintain her b complex vitamins, Vitamin D3, diclofenac sodium, polyethylene glycol powder, atorvastatin, TRIBENZOR, potassium chloride SA, diclofenac sodium, and traMADol.  Meds ordered this encounter  Medications  . ezetimibe (ZETIA) 10 MG tablet    Sig: Take 1 tablet (10 mg total) by mouth daily.    Dispense:  90 tablet    Refill:  3     Follow-up: Return in about 6 months (around 03/22/2015).  Sanda Linger, MD

## 2014-09-22 NOTE — Progress Notes (Signed)
Pre visit review using our clinic review tool, if applicable. No additional management support is needed unless otherwise documented below in the visit note. 

## 2014-09-23 MED ORDER — EZETIMIBE 10 MG PO TABS: 10.0000 mg | ORAL_TABLET | Freq: Every day | ORAL | Status: DC

## 2014-10-06 DIAGNOSIS — M170 Bilateral primary osteoarthritis of knee: Secondary | ICD-10-CM | POA: Diagnosis not present

## 2014-11-02 ENCOUNTER — Other Ambulatory Visit: Payer: Self-pay | Admitting: Internal Medicine

## 2014-11-04 ENCOUNTER — Ambulatory Visit: Payer: Medicare Other | Admitting: Cardiology

## 2014-11-04 ENCOUNTER — Encounter: Payer: Self-pay | Admitting: *Deleted

## 2014-11-06 NOTE — Progress Notes (Signed)
HPI: FU CAD. Patient previously had a Myoview preop prior to knee surgery that showed a subtle reversible defect in the apical anterior wall superimposed on a small fixed defect in the anteroseptal wall of the mid ventricle and apex. This was suggestive of prior infarct and very mild ischemia in the anterior and apex. There is also felt to be a prior infarct in the inferior lateral wall. Ejection fraction 65%. Echocardiogram in September of 2013 showed normal LV function, mild to moderate left ventricular hypertrophy, mild left atrial enlargement and mild mitral regurgitation. Patient then had cardiac cath in Sept 2013. This revealed a 20% left main. The LAD has a proximal 50% lesion and there is diffuse 70% stenosis in the first diagonal. The second diagonal had an 80% proximal lesion. The mid LAD had an 80% lesion. The septals were large and supplied collaterals to the right coronary artery. The circumflex was severely diseased with a proximal diffuse 75% lesion and a totally occluded after the first marginal. The second marginal was totally occluded and filled via left to left collaterals. The right coronary was totally occluded. Ejection fraction was normal. PCI was not an option. We discussed CABG versus medical therapy and patient elected medical therapy. Since I last saw her, the patient denies any dyspnea on exertion, orthopnea, PND, pedal edema, palpitations, syncope or chest pain.   Current Outpatient Prescriptions  Medication Sig Dispense Refill  . atorvastatin (LIPITOR) 40 MG tablet TAKE 1 TABLET BY MOUTH DAILY 90 tablet 3  . b complex vitamins tablet Take 1 tablet by mouth daily.     . Cholecalciferol (VITAMIN D3) 1000 UNITS CAPS Take 1,000 Units by mouth daily.     . diclofenac sodium (VOLTAREN) 1 % GEL APPLY TOPICALLY FOUR TIMES DAILY 100 g 11  . ezetimibe (ZETIA) 10 MG tablet Take 1 tablet (10 mg total) by mouth daily. 90 tablet 3  . polyethylene glycol powder (MIRALAX) powder  Take 1 Container by mouth once.    . potassium chloride SA (K-DUR,KLOR-CON) 20 MEQ tablet Take 1 tablet (20 mEq total) by mouth 2 (two) times daily. 60 tablet 11  . traMADol (ULTRAM) 50 MG tablet TAKE 2 TABLETS BY MOUTH EVERY 6 HOURS AS NEEDED 120 tablet 5  . TRIBENZOR 40-5-12.5 MG TABS TAKE 1 TABLET BY MOUTH EVERY DAY 90 tablet 2   No current facility-administered medications for this visit.     Past Medical History  Diagnosis Date  . Hyperlipidemia   . Osteoporosis   . Arthritis   . Eczema   . Cataracts, bilateral   . Hypertension     primary Dr. Sanda Lingerhomas Jones [phone], saw Dr. Jens Somrenshaw x 1 cardio  . CAD (coronary artery disease)     Past Surgical History  Procedure Laterality Date  . Abdominal hysterectomy    . Fracture surgery      shoulder surgery 2008  . Lipoma excision      2011  . Total knee arthroplasty  09/27/2011    Procedure: TOTAL KNEE ARTHROPLASTY;  Surgeon: Thera FlakeW D Caffrey Jr., MD;  Location: [facility] OR;  Service: Orthopedics;  Laterality: Right;  right total knee arthroplasty    Social History   Social History  . Marital Status: Widowed    Spouse Name: N/A  . Number of Children: N/A  . Years of Education: N/A   Occupational History  . Not on file.   Social History Main Topics  . Smoking status: Never Smoker   .  Smokeless tobacco: Never Used  . Alcohol Use: No  . Drug Use: No  . Sexual Activity: Not Currently   Other Topics Concern  . Not on file   Social History Narrative   Caffine drinks-Yes   Seat belt use-Yes   Regular exercise-Yes   Smoke alarm in home-Yes   Guns/firearms in home-NO   History of physical abuse-NO    ROS: Knee arthralgias but no fevers or chills, productive cough, hemoptysis, dysphasia, odynophagia, melena, hematochezia, dysuria, hematuria, rash, seizure activity, orthopnea, PND, pedal edema, claudication. Remaining systems are negative.  Physical Exam: Well-developed well-nourished in no acute distress.  Skin is warm and  dry.  HEENT is normal.  Neck is supple.  Chest is clear to auscultation with normal expansion.  Cardiovascular exam is regular rate and rhythm.  Abdominal exam nontender or distended. No masses palpated. Extremities show no edema. neuro grossly intact  ECG Normal sinus rhythm with no ST changes.

## 2014-11-07 ENCOUNTER — Encounter: Payer: Self-pay | Admitting: Cardiology

## 2014-11-07 ENCOUNTER — Ambulatory Visit (INDEPENDENT_AMBULATORY_CARE_PROVIDER_SITE_OTHER): Payer: Medicare Other | Admitting: Cardiology

## 2014-11-07 VITALS — BP 130/70 | HR 75 | Ht 62.0 in | Wt 133.1 lb

## 2014-11-07 DIAGNOSIS — I10 Essential (primary) hypertension: Secondary | ICD-10-CM | POA: Diagnosis not present

## 2014-11-07 DIAGNOSIS — E785 Hyperlipidemia, unspecified: Secondary | ICD-10-CM | POA: Diagnosis not present

## 2014-11-07 DIAGNOSIS — I2510 Atherosclerotic heart disease of native coronary artery without angina pectoris: Secondary | ICD-10-CM | POA: Diagnosis not present

## 2014-11-07 DIAGNOSIS — I2583 Coronary atherosclerosis due to lipid rich plaque: Secondary | ICD-10-CM

## 2014-11-07 NOTE — Assessment & Plan Note (Signed)
Blood pressure controlled. Continue present medications. 

## 2014-11-07 NOTE — Patient Instructions (Signed)
Your physician wants you to follow-up in: ONE YEAR WITH DR CRENSHAW You will receive a reminder letter in the mail two months in advance. If you don't receive a letter, please call our office to schedule the follow-up appointment.  

## 2014-11-07 NOTE — Assessment & Plan Note (Signed)
Continue statin. 

## 2014-11-07 NOTE — Assessment & Plan Note (Signed)
Patient known to have severe coronary disease on previous cardiac catheterization with preserved LV function. Based on anatomy she is not a candidate for PCI. She has considered coronary artery bypass and graft versus medical therapy. Given her age she would be high risk for surgery. She would prefer conservative measures. Plan continue aspirin and statin. She did not tolerate beta blockade. She is not having chest pain. Given the severity of her coronary artery disease she would be high risk for any surgery. She does have significant knee arthralgias. Her orthopedist apparently feels she may could have knee replacement with an epidural. This would certainly be lower risk than general anesthesia but she would still have risk with any surgical procedure. She states she would be willing to accept that risk because her knee pain is severe. As long as everyone understands there is risk she could proceed.

## 2014-12-07 DIAGNOSIS — S52501A Unspecified fracture of the lower end of right radius, initial encounter for closed fracture: Secondary | ICD-10-CM | POA: Diagnosis not present

## 2014-12-07 DIAGNOSIS — R42 Dizziness and giddiness: Secondary | ICD-10-CM | POA: Diagnosis not present

## 2014-12-07 DIAGNOSIS — S098XXA Other specified injuries of head, initial encounter: Secondary | ICD-10-CM | POA: Diagnosis not present

## 2014-12-08 DIAGNOSIS — G319 Degenerative disease of nervous system, unspecified: Secondary | ICD-10-CM | POA: Diagnosis not present

## 2014-12-08 DIAGNOSIS — S0990XA Unspecified injury of head, initial encounter: Secondary | ICD-10-CM | POA: Diagnosis not present

## 2014-12-13 DIAGNOSIS — M19031 Primary osteoarthritis, right wrist: Secondary | ICD-10-CM | POA: Diagnosis not present

## 2015-02-01 ENCOUNTER — Ambulatory Visit: Payer: Medicare Other | Admitting: Internal Medicine

## 2015-02-03 ENCOUNTER — Other Ambulatory Visit: Payer: Self-pay | Admitting: Internal Medicine

## 2015-02-09 ENCOUNTER — Encounter: Payer: Self-pay | Admitting: Internal Medicine

## 2015-02-09 ENCOUNTER — Ambulatory Visit (INDEPENDENT_AMBULATORY_CARE_PROVIDER_SITE_OTHER): Payer: Medicare Other | Admitting: Internal Medicine

## 2015-02-09 VITALS — BP 138/70 | HR 76 | Temp 97.7°F | Resp 16 | Ht 62.0 in | Wt 133.0 lb

## 2015-02-09 DIAGNOSIS — Z23 Encounter for immunization: Secondary | ICD-10-CM | POA: Diagnosis not present

## 2015-02-09 DIAGNOSIS — M170 Bilateral primary osteoarthritis of knee: Secondary | ICD-10-CM | POA: Diagnosis not present

## 2015-02-09 DIAGNOSIS — E785 Hyperlipidemia, unspecified: Secondary | ICD-10-CM

## 2015-02-09 DIAGNOSIS — I10 Essential (primary) hypertension: Secondary | ICD-10-CM

## 2015-02-09 DIAGNOSIS — I2510 Atherosclerotic heart disease of native coronary artery without angina pectoris: Secondary | ICD-10-CM | POA: Diagnosis not present

## 2015-02-09 LAB — HM DEXA SCAN

## 2015-02-09 MED ORDER — TRAMADOL HCL 50 MG PO TABS: 100.0000 mg | ORAL_TABLET | Freq: Four times a day (QID) | ORAL | Status: DC | PRN

## 2015-02-09 NOTE — Progress Notes (Signed)
Subjective:  Patient ID: Virginia Edwards, female    DOB: 1925/08/08  Age: 82 y.o. MRN: [id number]  CC: Hypertension; Osteoarthritis; and Hyperlipidemia   HPI Virginia Edwards presents for follow-up, she complains of feeling achy and stiff all over, she also has her usual arthritis pain. She offers no other complaints.  Outpatient Prescriptions Prior to Visit  Medication Sig Dispense Refill  . b complex vitamins tablet Take 1 tablet by mouth daily.     . Cholecalciferol (VITAMIN D3) 1000 UNITS CAPS Take 1,000 Units by mouth daily.     . diclofenac sodium (VOLTAREN) 1 % GEL APPLY TOPICALLY FOUR TIMES DAILY 100 g 11  . polyethylene glycol powder (MIRALAX) powder Take 1 Container by mouth once.    . potassium chloride SA (K-DUR,KLOR-CON) 20 MEQ tablet TAKE 1 TABLET BY MOUTH TWICE DAILY 60 tablet 3  . TRIBENZOR 40-5-12.5 MG TABS TAKE 1 TABLET BY MOUTH EVERY DAY 90 tablet 2  . atorvastatin (LIPITOR) 40 MG tablet TAKE 1 TABLET BY MOUTH DAILY 90 tablet 3  . ezetimibe (ZETIA) 10 MG tablet Take 1 tablet (10 mg total) by mouth daily. 90 tablet 3  . traMADol (ULTRAM) 50 MG tablet TAKE 2 TABLETS BY MOUTH EVERY 6 HOURS AS NEEDED 120 tablet 5   No facility-administered medications prior to visit.    ROS Review of Systems  Constitutional: Negative.  Negative for fever, chills, diaphoresis, appetite change and fatigue.  HENT: Negative.   Eyes: Negative.   Respiratory: Negative.  Negative for cough, choking, chest tightness, shortness of breath and stridor.   Cardiovascular: Negative.  Negative for chest pain, palpitations and leg swelling.  Gastrointestinal: Negative.  Negative for nausea, vomiting, abdominal pain, diarrhea, constipation and blood in stool.  Endocrine: Negative.   Genitourinary: Negative.   Musculoskeletal: Positive for myalgias and arthralgias. Negative for back pain, joint swelling, gait problem, neck pain and neck stiffness.  Skin: Negative.  Negative for color change and rash.    Allergic/Immunologic: Negative.   Neurological: Negative.  Negative for dizziness, tremors, syncope, light-headedness, numbness and headaches.  Hematological: Negative.  Negative for adenopathy. Does not bruise/bleed easily.  Psychiatric/Behavioral: Negative.     Objective:  BP 138/70 mmHg  Pulse 76  Temp(Src) 97.7 F (36.5 C) (Oral)  Resp 16  Ht  (1.575 m)  Wt 133 lb (60.328 kg)  BMI 24.32 kg/m2  SpO2 99%  BP Readings from Last 3 Encounters:  02/09/15 138/70  11/07/14 130/70  09/22/14 150/74    Wt Readings from Last 3 Encounters:  02/09/15 133 lb (60.328 kg)  11/07/14 133 lb 1.6 oz (60.374 kg)  09/22/14 133 lb (60.328 kg)    Physical Exam  Constitutional: She is oriented to person, place, and time.  HENT:  Mouth/Throat: Oropharynx is clear and moist. No oropharyngeal exudate.  Eyes: Conjunctivae are normal. Right eye exhibits no discharge. Left eye exhibits no discharge. No scleral icterus.  Neck: Normal range of motion. Neck supple. No JVD present. No tracheal deviation present. No thyromegaly present.  Cardiovascular: Normal rate, regular rhythm, S1 normal, S2 normal and intact distal pulses.  Exam reveals no gallop and no friction rub.   Murmur heard.  Decrescendo systolic murmur is present with a grade of 1/6   No diastolic murmur is present  Pulmonary/Chest: Effort normal and breath sounds normal. No stridor. No respiratory distress. She has no wheezes. She has no rales. She exhibits no tenderness.  Abdominal: Soft. Bowel sounds are normal. She exhibits no distension and  no mass. There is no tenderness. There is no rebound and no guarding.  Musculoskeletal: Normal range of motion. She exhibits no edema or tenderness.  Lymphadenopathy:    She has no cervical adenopathy.  Neurological: She is oriented to person, place, and time.  Skin: Skin is warm and dry. No rash noted. She is not diaphoretic. No erythema. No pallor.  Psychiatric: She has a normal mood and  affect. Her behavior is normal. Judgment and thought content normal.  Vitals reviewed.   Lab Results  Component Value Date   WBC 7.2 09/22/2014   HGB 12.5 09/22/2014   HCT 37.7 09/22/2014   PLT 192.0 09/22/2014   GLUCOSE 88 09/22/2014   CHOL 194 09/22/2014   TRIG 76.0 09/22/2014   HDL 59.30 09/22/2014   LDLDIRECT 201.5 07/05/2010   LDLCALC 119* 09/22/2014   ALT 16 03/31/2012   AST 20 03/31/2012   NA 141 09/22/2014   K 3.8 09/22/2014   CL 105 09/22/2014   CREATININE 0.78 09/22/2014   BUN 22 09/22/2014   CO2 27 09/22/2014   TSH 2.53 09/22/2014   INR 0.94 10/02/2011   HGBA1C 5.8 09/22/2014    No results found.  Assessment & Plan:   Virginia Edwards was seen today for hypertension, osteoarthritis and hyperlipidemia.  Diagnoses and all orders for this visit:  Coronary artery disease involving native coronary artery of native heart without angina pectoris- she has not had any recent episodes of chest pain or shortness of breath, she does complain of muscle aches so will try her off of the statin therapy for couple weeks and see if it improves.  Essential hypertension, benign- her blood pressure is well-controlled  Hyperlipidemia with target LDL less than 100- I've asked her to discontinue Lipitor and Zetia since she is having muscle aches, she also wants to try CoQ10  Primary osteoarthritis of both knees- will continue tramadol as needed for pain. -     traMADol (ULTRAM) 50 MG tablet; Take 2 tablets (100 mg total) by mouth every 6 (six) hours as needed.  Need for 23-polyvalent pneumococcal polysaccharide vaccine -     Pneumococcal polysaccharide vaccine 23-valent greater than or equal to 2yo subcutaneous/IM   I have discontinued Virginia Edwards's ezetimibe and atorvastatin. I have also changed her traMADol. Additionally, I am having her maintain her b complex vitamins, Vitamin D3, polyethylene glycol powder, diclofenac sodium, TRIBENZOR, and potassium chloride SA.  Meds ordered this  encounter  Medications  . traMADol (ULTRAM) 50 MG tablet    Sig: Take 2 tablets (100 mg total) by mouth every 6 (six) hours as needed.    Dispense:  120 tablet    Refill:  5     Follow-up: Return in about 6 months (around 08/09/2015).  Sanda Linger, MD

## 2015-02-09 NOTE — Progress Notes (Signed)
Pre visit review using our clinic review tool, if applicable. No additional management support is needed unless otherwise documented below in the visit note. 

## 2015-02-09 NOTE — Patient Instructions (Signed)
Hypertension Hypertension, commonly called high blood pressure, is when the force of blood pumping through your arteries is too strong. Your arteries are the blood vessels that carry blood from your heart throughout your body. A blood pressure reading consists of a higher number over a lower number, such as 110/72. The higher number (systolic) is the pressure inside your arteries when your heart pumps. The lower number (diastolic) is the pressure inside your arteries when your heart relaxes. Ideally you want your blood pressure below 120/80. Hypertension forces your heart to work harder to pump blood. Your arteries may become narrow or stiff. Having untreated or uncontrolled hypertension can cause heart attack, stroke, kidney disease, and other problems. RISK FACTORS Some risk factors for high blood pressure are controllable. Others are not.  Risk factors you cannot control include:   Race. You may be at higher risk if you are African American.  Age. Risk increases with age.  Gender. Men are at higher risk than women before age 45 years. After age 65, women are at higher risk than men. Risk factors you can control include:  Not getting enough exercise or physical activity.  Being overweight.  Getting too much fat, sugar, calories, or salt in your diet.  Drinking too much alcohol. SIGNS AND SYMPTOMS Hypertension does not usually cause signs or symptoms. Extremely high blood pressure (hypertensive crisis) may cause headache, anxiety, shortness of breath, and nosebleed. DIAGNOSIS To check if you have hypertension, your health care provider will measure your blood pressure while you are seated, with your arm held at the level of your heart. It should be measured at least twice using the same arm. Certain conditions can cause a difference in blood pressure between your right and left arms. A blood pressure reading that is higher than normal on one occasion does not mean that you need treatment. If  it is not clear whether you have high blood pressure, you may be asked to return on a different day to have your blood pressure checked again. Or, you may be asked to monitor your blood pressure at home for 1 or more weeks. TREATMENT Treating high blood pressure includes making lifestyle changes and possibly taking medicine. Living a healthy lifestyle can help lower high blood pressure. You may need to change some of your habits. Lifestyle changes may include:  Following the DASH diet. This diet is high in fruits, vegetables, and whole grains. It is low in salt, red meat, and added sugars.  Keep your sodium intake below 2,300 mg per day.  Getting at least 30-45 minutes of aerobic exercise at least 4 times per week.  Losing weight if necessary.  Not smoking.  Limiting alcoholic beverages.  Learning ways to reduce stress. Your health care provider may prescribe medicine if lifestyle changes are not enough to get your blood pressure under control, and if one of the following is true:  You are 18-59 years of age and your systolic blood pressure is above 140.  You are 60 years of age or older, and your systolic blood pressure is above 150.  Your diastolic blood pressure is above 90.  You have diabetes, and your systolic blood pressure is over 140 or your diastolic blood pressure is over 90.  You have kidney disease and your blood pressure is above 140/90.  You have heart disease and your blood pressure is above 140/90. Your personal target blood pressure may vary depending on your medical conditions, your age, and other factors. HOME CARE INSTRUCTIONS    Have your blood pressure rechecked as directed by your health care provider.   Take medicines only as directed by your health care provider. Follow the directions carefully. Blood pressure medicines must be taken as prescribed. The medicine does not work as well when you skip doses. Skipping doses also puts you at risk for  problems.  Do not smoke.   Monitor your blood pressure at home as directed by your health care provider. SEEK MEDICAL CARE IF:   You think you are having a reaction to medicines taken.  You have recurrent headaches or feel dizzy.  You have swelling in your ankles.  You have trouble with your vision. SEEK IMMEDIATE MEDICAL CARE IF:  You develop a severe headache or confusion.  You have unusual weakness, numbness, or feel faint.  You have severe chest or abdominal pain.  You vomit repeatedly.  You have trouble breathing. MAKE SURE YOU:   Understand these instructions.  Will watch your condition.  Will get help right away if you are not doing well or get worse.   This information is not intended to replace advice given to you by your health care provider. Make sure you discuss any questions you have with your health care provider.   Document Released: 01/07/2005 Document Revised: 05/24/2014 Document Reviewed: 10/30/2012 Elsevier Interactive Patient Education 2016 [employer]  

## 2015-02-10 ENCOUNTER — Telehealth: Payer: Self-pay | Admitting: Internal Medicine

## 2015-02-10 NOTE — Telephone Encounter (Signed)
E NOTE: All timestamps contained within this report are represented as [place] Standard Time. CONFIDENTIALTY NOTICE: This fax transmission is intended only for the addressee. It contains information that is legally privileged, confidential or otherwise protected from use or disclosure. If you are not the intended recipient, you are strictly prohibited from reviewing, disclosing, copying using or disseminating any of this information or taking any action in reliance on or regarding this information. If you have received this fax in error, please notify us immediately by telephone so that we can arrange for its return to [place]. Phone: [phone], Toll-Free: [phone], Fax: [phone] Page: 1 of 1 Call Id: [id number] [facility] - Client TELEPHONE ADVICE RECORD [facility] Patient Name: Virginia Edwards DOB: 11/07/1925 Initial Comment Caller states she wants to take otc med, has question. Wants to take coqt for arthritis pain. Nurse Assessment Guidelines Guideline Title Affirmed Question Affirmed Notes Final Disposition User Clinical Call Dillon, RN, Amy Comments THE PATIENT IS WANTING TO KNOW IF SHE CAN ADD COQ10 TO HER REGIMEN OF MEDICATIONS. SHE TAKES 4 PRESCRIPTION MEDICATIONS AND SHE WANTS TO MAKE SURE THAT DR. Yetta Barre WOULD BE OKAY WITH HER STARTING THIS MEDICATION. INFORMED HER THAT WOULD PUT IN THE REQUEST FOR DR. Yetta Barre TO ADVISE IF THIS WOULD BE OKAY FOR HER TO DO AND SOMEONE WOULD GET BACK WITH HER WITH THE INFORMATION.

## 2015-02-10 NOTE — Telephone Encounter (Signed)
Please advise 

## 2015-02-10 NOTE — Telephone Encounter (Signed)
Yes, she can try coQ10. Also, when I saw her this past week she complained of feeling achy and stiff all over, please ask her to stop taking Lipitor and Zetia at this time and see if it helps her feel better.

## 2015-02-13 NOTE — Telephone Encounter (Signed)
Pt informed to d/c medications and to call back with improvement or worsening

## 2015-03-08 ENCOUNTER — Telehealth: Payer: Self-pay | Admitting: Internal Medicine

## 2015-03-08 NOTE — Telephone Encounter (Signed)
Patient called to request an appt. SX of flu for 1 week. She states that she has to do a 9:30 because of her transportation and is not flexible. Also states that she would rather see you. Would you be willing to double up for Monday at 930 to see this patient for an acute?   Ps- this is per her request, and she declined my other offers to see her as soon as tomorrow, stating that she has no transportation

## 2015-03-08 NOTE — Telephone Encounter (Signed)
Yes, I will do that.  

## 2015-03-09 NOTE — Telephone Encounter (Signed)
Called patient. She advised that she absolutely can not come in at all. She asked her transportation, who was sitting next to her, and they told her no. She advised me that she would try to go to an urgent care, but i did ask that she call us before going without.   Should i call EMS for a wellness check?

## 2015-03-10 DIAGNOSIS — J019 Acute sinusitis, unspecified: Secondary | ICD-10-CM | POA: Diagnosis not present

## 2015-03-21 DIAGNOSIS — M170 Bilateral primary osteoarthritis of knee: Secondary | ICD-10-CM | POA: Diagnosis not present

## 2015-04-13 DIAGNOSIS — H2513 Age-related nuclear cataract, bilateral: Secondary | ICD-10-CM | POA: Diagnosis not present

## 2015-04-20 DIAGNOSIS — H2512 Age-related nuclear cataract, left eye: Secondary | ICD-10-CM | POA: Diagnosis not present

## 2015-06-05 DIAGNOSIS — M545 Low back pain: Secondary | ICD-10-CM | POA: Diagnosis not present

## 2015-07-19 ENCOUNTER — Emergency Department (HOSPITAL_COMMUNITY): Payer: Medicare Other

## 2015-07-19 ENCOUNTER — Emergency Department (HOSPITAL_COMMUNITY)
Admission: EM | Admit: 2015-07-19 | Discharge: 2015-07-20 | Disposition: A | Discharge status: Home / Self Care | Payer: Medicare Other | Attending: Emergency Medicine | Admitting: Emergency Medicine

## 2015-07-19 ENCOUNTER — Encounter (HOSPITAL_COMMUNITY): Payer: Self-pay | Admitting: Emergency Medicine

## 2015-07-19 DIAGNOSIS — R600 Localized edema: Secondary | ICD-10-CM | POA: Insufficient documentation

## 2015-07-19 DIAGNOSIS — R52 Pain, unspecified: Secondary | ICD-10-CM

## 2015-07-19 DIAGNOSIS — W06XXXA Fall from bed, initial encounter: Secondary | ICD-10-CM | POA: Diagnosis not present

## 2015-07-19 DIAGNOSIS — M5432 Sciatica, left side: Secondary | ICD-10-CM | POA: Diagnosis not present

## 2015-07-19 DIAGNOSIS — Y939 Activity, unspecified: Secondary | ICD-10-CM | POA: Insufficient documentation

## 2015-07-19 DIAGNOSIS — M1990 Unspecified osteoarthritis, unspecified site: Secondary | ICD-10-CM

## 2015-07-19 DIAGNOSIS — M13869 Other specified arthritis, unspecified knee: Secondary | ICD-10-CM | POA: Insufficient documentation

## 2015-07-19 DIAGNOSIS — Z7982 Long term (current) use of aspirin: Secondary | ICD-10-CM | POA: Insufficient documentation

## 2015-07-19 DIAGNOSIS — I2510 Atherosclerotic heart disease of native coronary artery without angina pectoris: Secondary | ICD-10-CM | POA: Insufficient documentation

## 2015-07-19 DIAGNOSIS — Z96651 Presence of right artificial knee joint: Secondary | ICD-10-CM | POA: Diagnosis not present

## 2015-07-19 DIAGNOSIS — I10 Essential (primary) hypertension: Secondary | ICD-10-CM | POA: Diagnosis not present

## 2015-07-19 DIAGNOSIS — R2243 Localized swelling, mass and lump, lower limb, bilateral: Secondary | ICD-10-CM | POA: Diagnosis present

## 2015-07-19 DIAGNOSIS — Z79899 Other long term (current) drug therapy: Secondary | ICD-10-CM | POA: Insufficient documentation

## 2015-07-19 DIAGNOSIS — Y929 Unspecified place or not applicable: Secondary | ICD-10-CM | POA: Insufficient documentation

## 2015-07-19 DIAGNOSIS — M25562 Pain in left knee: Secondary | ICD-10-CM | POA: Diagnosis not present

## 2015-07-19 DIAGNOSIS — S199XXA Unspecified injury of neck, initial encounter: Secondary | ICD-10-CM | POA: Diagnosis not present

## 2015-07-19 DIAGNOSIS — M5431 Sciatica, right side: Secondary | ICD-10-CM | POA: Diagnosis not present

## 2015-07-19 DIAGNOSIS — S8992XA Unspecified injury of left lower leg, initial encounter: Secondary | ICD-10-CM | POA: Diagnosis not present

## 2015-07-19 DIAGNOSIS — Y999 Unspecified external cause status: Secondary | ICD-10-CM | POA: Insufficient documentation

## 2015-07-19 DIAGNOSIS — S8991XA Unspecified injury of right lower leg, initial encounter: Secondary | ICD-10-CM | POA: Diagnosis not present

## 2015-07-19 DIAGNOSIS — M542 Cervicalgia: Secondary | ICD-10-CM | POA: Diagnosis not present

## 2015-07-19 DIAGNOSIS — M25561 Pain in right knee: Secondary | ICD-10-CM | POA: Diagnosis not present

## 2015-07-19 LAB — BASIC METABOLIC PANEL
ANION GAP: 8 (ref 5–15)
BUN: 20 mg/dL (ref 6–20)
CALCIUM: 10.1 mg/dL (ref 8.9–10.3)
CO2: 27 mmol/L (ref 22–32)
CREATININE: 0.76 mg/dL (ref 0.44–1.00)
Chloride: 101 mmol/L (ref 101–111)
GLUCOSE: 95 mg/dL (ref 65–99)
POTASSIUM: 3.8 mmol/L (ref 3.5–5.1)
SODIUM: 136 mmol/L (ref 135–145)

## 2015-07-19 LAB — I-STAT TROPONIN, ED: TROPONIN I, POC: 0.02 ng/mL (ref 0.00–0.08)

## 2015-07-19 LAB — CBC
HCT: 36.9 % (ref 36.0–46.0)
HEMOGLOBIN: 11.6 g/dL — AB (ref 12.0–15.0)
MCH: 29.3 pg (ref 26.0–34.0)
MCHC: 31.4 g/dL (ref 30.0–36.0)
MCV: 93.2 fL (ref 78.0–100.0)
Platelets: 245 10*3/uL (ref 150–400)
RBC: 3.96 MIL/uL (ref 3.87–5.11)
RDW: 12.4 % (ref 11.5–15.5)
WBC: 7.7 10*3/uL (ref 4.0–10.5)

## 2015-07-19 LAB — BRAIN NATRIURETIC PEPTIDE: B Natriuretic Peptide: 68.2 pg/mL (ref 0.0–100.0)

## 2015-07-19 MED ORDER — HYDROCODONE-ACETAMINOPHEN 5-325 MG PO TABS
1.0000 | ORAL_TABLET | Freq: Once | ORAL | Status: AC
  Administered 2015-07-19: 1 via ORAL
  Filled 2015-07-19: qty 1

## 2015-07-19 MED ORDER — HYDROCODONE-ACETAMINOPHEN 5-325 MG PO TABS: 1.0000 | ORAL_TABLET | Freq: Four times a day (QID) | ORAL | Status: DC | PRN

## 2015-07-19 MED ORDER — DIAZEPAM 5 MG PO TABS
5.0000 mg | ORAL_TABLET | Freq: Once | ORAL | Status: AC
  Administered 2015-07-19: 5 mg via ORAL
  Filled 2015-07-19: qty 1

## 2015-07-19 NOTE — ED Notes (Signed)
Patient transported to X-ray 

## 2015-07-19 NOTE — ED Notes (Signed)
Pt states over the last week she has had bilateral leg swelling from knee down that has gotten worse. Pt also c/o of intermittent right knee pain with no injury. Pt denies any pain at this time. Pt denies any sob or chest pain. Pedal pulses intact and sensation. Redness is noted bilaterally in ankle area.

## 2015-07-19 NOTE — ED Provider Notes (Signed)
CSN: [id number]     Arrival date & time 07/19/15  1711 History  By signing my name below, I, Marisue HumbleMichelle Chaffee, attest that this documentation has been prepared under the direction and in the presence of Gwyneth SproutWhitney Krishika Bugge, MD . Electronically Signed: Marisue HumbleMichelle Chaffee, Scribe. 07/19/2015. 7:53 PM.   Chief Complaint  Patient presents with  . Leg Swelling    The history is provided by the patient. No language interpreter was used.   HPI Comments:  Virginia Edwards is a 82 y.o. female with PMHx of HLD, arthritis, osteoporosis, HTN and CAD who presents to the Emergency Department complaining of gradual onset, worsening BL leg swelling from knee down for the past few weeks. Swelling worsens after eating sugar and salt. It is also worse upon waking in the morning and better in the evening. She notes rolling off the bed 1-2 weeks ago landing on carpet prior to symptom onset. Pt states she is unsure of syncope. Pt reports associated neck pain, intermittent left upper leg pain lasting for a few hours, worse with cold weather and BL knee pain. She has been able to walk with a walker since the fall, which is normal for her. Pt has been less active in the past few weeks. Pt has taken Tramadol with some relief. Family notes pt has had an injection for sciatica; pt plans to follow-up for another injection. Denies shortness of breath or chest pain.  Past Medical History  Diagnosis Date  . Hyperlipidemia   . Osteoporosis   . Arthritis   . Eczema   . Cataracts, bilateral   . Hypertension     primary Dr. Sanda Lingerhomas Jones [phone], saw Dr. Jens Somrenshaw x 1 cardio  . CAD (coronary artery disease)    Past Surgical History  Procedure Laterality Date  . Abdominal hysterectomy    . Fracture surgery      shoulder surgery 2008  . Lipoma excision      2011  . Total knee arthroplasty  09/27/2011    Procedure: TOTAL KNEE ARTHROPLASTY;  Surgeon: Thera FlakeW D Caffrey Jr., MD;  Location: [facility] OR;  Service: Orthopedics;  Laterality: Right;   right total knee arthroplasty   Family History  Problem Relation Age of Onset  . Lung cancer Father   . Heart Problems Brother   . Heart attack Brother   . Hypertension Sister    Social History  Substance Use Topics  . Smoking status: Never Smoker   . Smokeless tobacco: Never Used  . Alcohol Use: No   OB History    No data available     Review of Systems  Respiratory: Negative for shortness of breath.   Cardiovascular: Positive for leg swelling. Negative for chest pain.  Musculoskeletal: Positive for myalgias, joint swelling, arthralgias and neck pain.  All other systems reviewed and are negative.   Allergies  Alendronate sodium and Shellfish allergy  Home Medications   Prior to Admission medications   Medication Sig Start Date End Date Taking? Authorizing Provider  aspirin EC 81 MG tablet Take 81 mg by mouth daily.   Yes Historical Provider, MD  atorvastatin (LIPITOR) 40 MG tablet Take 40 mg by mouth daily at 6 PM.   Yes Historical Provider, MD  b complex vitamins tablet Take 1 tablet by mouth daily.    Yes Historical Provider, MD  Cholecalciferol (VITAMIN D3) 1000 UNITS CAPS Take 1,000 Units by mouth daily.    Yes Historical Provider, MD  potassium chloride SA (K-DUR,KLOR-CON) 20 MEQ tablet TAKE 1  TABLET BY MOUTH TWICE DAILY 02/03/15  Yes Etta Grandchild, MD  traMADol (ULTRAM) 50 MG tablet Take 2 tablets (100 mg total) by mouth every 6 (six) hours as needed. 02/09/15  Yes Etta Grandchild, MD  TRIBENZOR 40-5-12.5 MG TABS TAKE 1 TABLET BY MOUTH EVERY DAY 11/02/14  Yes Etta Grandchild, MD  diclofenac sodium (VOLTAREN) 1 % GEL APPLY TOPICALLY FOUR TIMES DAILY 08/28/14   Etta Grandchild, MD   BP 115/48 mmHg  Pulse 85  Temp(Src) 98.1 F (36.7 C) (Oral)  Resp 17  Ht  (1.676 m)  Wt 133 lb (60.328 kg)  BMI 21.48 kg/m2  SpO2 98%   Physical Exam  Constitutional: She is oriented to person, place, and time. She appears well-developed and well-nourished. No distress.  HENT:   Head: Normocephalic and atraumatic.  Eyes: EOM are normal.  Cardiovascular: Normal rate, regular rhythm and normal heart sounds.   Pulmonary/Chest: Effort normal and breath sounds normal.  Abdominal: Soft. She exhibits no distension. There is no tenderness.  Musculoskeletal: Normal range of motion.  moderate kyphosis with TTP in paracervical muscles BL; normal movement and strength in UE; BL knee swelling and popping with extension and flexion; minimal effusion; no erythema or warmth; leg raise reproduces sciatic type pain going down both legs; 1+ pitting edema from mid shin down BL; 1+ DP pulses BL; <3 sec capillary refill  Neurological: She is alert and oriented to person, place, and time.  Skin: Skin is warm and dry.  Psychiatric: She has a normal mood and affect. Judgment normal.  Nursing note and vitals reviewed.   ED Course  Procedures  DIAGNOSTIC STUDIES:  Oxygen Saturation is 98% on RA, normal by my interpretation.    COORDINATION OF CARE:  7:39 PM Will order imaging. Recommended pt elevate her feet and wear compression stockings. Will give pain medication. Discussed treatment plan with pt at bedside and pt agreed to plan.  Labs Review Labs Reviewed  CBC - Abnormal; Notable for the following:    Hemoglobin 11.6 (*)    All other components within normal limits  BASIC METABOLIC PANEL  BRAIN NATRIURETIC PEPTIDE  I-STAT TROPOININ, ED    Imaging Review Dg Knee 1-2 Views Left  07/19/2015  CLINICAL DATA:  Status post fall 2 weeks ago with bilateral knee pain EXAM: LEFT KNEE - 1-2 VIEW COMPARISON:  None. FINDINGS: Severe degenerative joint changes of the left knee are identified. There is no acute fracture or dislocation. IMPRESSION: Severe osteoarthritic changes of left knee without acute fracture or dislocation. Electronically Signed   By: Sherian Rein M.D.   On: 07/19/2015 20:50   Dg Knee 1-2 Views Right  07/19/2015  CLINICAL DATA:  Status post fall 2 weeks ago with  bilateral knee pain. EXAM: RIGHT KNEE - 1-2 VIEW COMPARISON:  None. FINDINGS: There are severe degenerative joint changes of the right knee. There is no acute fracture or dislocation. IMPRESSION: Severe osteoarthritic changes of right knee without acute fracture or dislocation. Electronically Signed   By: Sherian Rein M.D.   On: 07/19/2015 20:51   Ct Cervical Spine Wo Contrast  07/19/2015  CLINICAL DATA:  82 year old female with fall and neck pain EXAM: CT CERVICAL SPINE WITHOUT CONTRAST TECHNIQUE: Multidetector CT imaging of the cervical spine was performed without intravenous contrast. Multiplanar CT image reconstructions were also generated. COMPARISON:  None. FINDINGS: There is no acute fracture or subluxation of the cervical spine.There is osteopenia with extensive multilevel degenerative changes primarily involving C3-C7.  There is grade 1 C3-C4 anterolisthesis. There is resulting mild reversal of normal cervical lordosis. There multilevel osteophyte formation with facet hypertrophy most prominent at C4-C5 with mild narrowing of the central canal and probable bilateral neural foramina stenosis. MRI may provide better evaluation.The odontoid and spinous processes are intact.There is normal anatomic alignment of the C1-C2 lateral masses. The visualized soft tissues appear unremarkable. Small calcific density noted in the parotid glands bilaterally. IMPRESSION: No acute/ traumatic cervical spine pathology. Extensive multilevel degenerative changes. Electronically Signed   By: Elgie CollardArash  Radparvar M.D.   On: 07/19/2015 22:39   I have personally reviewed and evaluated these images and lab results as part of my medical decision-making.   EKG Interpretation None      MDM   Final diagnoses:  Pain  Bilateral lower extremity edema  Arthritis  Bilateral sciatica   Patient is an 82 year old female with a history of coronary artery disease, hypertension, arthritis presenting today with worsening bilateral  knee pain, lower extremity swelling and neck pain for the last 2 weeks after falling out of bed. She states that she rolled out of bed onto a carpeted floor and since that time she has had worsening pain. She has evidence of significant arthritis in her knees by exam but no significant effusions or erythema. Able to flex and extend the knees however with flexing the hip she gets a severe pain that shoots down her legs which is concerning for possible lumbar radiculopathy. She has a prior history of degenerative disc disease and cortisone shots in her back and is planning to follow-up with her specialist however in the last 2 weeks she's noticed worsening swelling in her legs and wanted to make sure nothing else was going on. On exam patient has 1+ dependent edema that is pitting. 1+ palpable DP pulse and capillary refill intact. Feet are warm. Feel most likely swelling is related to patient not elevating her feet and being less active since the pain began. She had labs done prior to being brought back which showed a normal BNP, troponin, CBC and BMP. Low suspicion that this is CHF or from a renal cause. Discussed with her using compression stockings and elevating her feet. Also patient takes tramadol 2 tablets every 6 hours and it is not significantly improving her pain. She was given 1 Vicodin here to see if that improved her symptoms.  X-ray of the knee show severe arthritis but no acute findings. CT of the neck without acute pathology.  Mild improvement in sx with vicodin and valium however daughter feels like the valium makes her too drowsy.  Will try vicodin over tramadol.   I personally performed the services described in this documentation, which was scribed in my presence.  The recorded information has been reviewed and considered.    Gwyneth SproutWhitney Darryn Kydd, MD 07/19/15 2303

## 2015-07-19 NOTE — ED Notes (Signed)
Pt back from x-ray.

## 2015-07-19 NOTE — ED Notes (Signed)
Pt refusing to get in bed or be undressed and hooked up to monitor. She is stating she would like to leave if the testing is going to take much longer.

## 2015-07-24 ENCOUNTER — Emergency Department (HOSPITAL_BASED_OUTPATIENT_CLINIC_OR_DEPARTMENT_OTHER): Payer: Medicare Other

## 2015-07-24 ENCOUNTER — Encounter (HOSPITAL_BASED_OUTPATIENT_CLINIC_OR_DEPARTMENT_OTHER): Payer: Self-pay | Admitting: Emergency Medicine

## 2015-07-24 ENCOUNTER — Emergency Department (HOSPITAL_BASED_OUTPATIENT_CLINIC_OR_DEPARTMENT_OTHER)
Admission: EM | Admit: 2015-07-24 | Discharge: 2015-07-24 | Disposition: A | Discharge status: Other Acute Inpatient Hospital | Payer: Medicare Other | Attending: Emergency Medicine | Admitting: Emergency Medicine

## 2015-07-24 DIAGNOSIS — S3992XA Unspecified injury of lower back, initial encounter: Secondary | ICD-10-CM | POA: Diagnosis not present

## 2015-07-24 DIAGNOSIS — R51 Headache: Secondary | ICD-10-CM | POA: Diagnosis not present

## 2015-07-24 DIAGNOSIS — E785 Hyperlipidemia, unspecified: Secondary | ICD-10-CM | POA: Diagnosis not present

## 2015-07-24 DIAGNOSIS — Y9384 Activity, sleeping: Secondary | ICD-10-CM | POA: Diagnosis not present

## 2015-07-24 DIAGNOSIS — M545 Low back pain: Secondary | ICD-10-CM | POA: Diagnosis not present

## 2015-07-24 DIAGNOSIS — M549 Dorsalgia, unspecified: Secondary | ICD-10-CM | POA: Diagnosis not present

## 2015-07-24 DIAGNOSIS — T148 Other injury of unspecified body region: Secondary | ICD-10-CM | POA: Diagnosis not present

## 2015-07-24 DIAGNOSIS — R079 Chest pain, unspecified: Secondary | ICD-10-CM | POA: Diagnosis not present

## 2015-07-24 DIAGNOSIS — M25551 Pain in right hip: Secondary | ICD-10-CM | POA: Diagnosis not present

## 2015-07-24 DIAGNOSIS — M1990 Unspecified osteoarthritis, unspecified site: Secondary | ICD-10-CM | POA: Insufficient documentation

## 2015-07-24 DIAGNOSIS — I2510 Atherosclerotic heart disease of native coronary artery without angina pectoris: Secondary | ICD-10-CM | POA: Diagnosis not present

## 2015-07-24 DIAGNOSIS — Z79891 Long term (current) use of opiate analgesic: Secondary | ICD-10-CM | POA: Insufficient documentation

## 2015-07-24 DIAGNOSIS — I10 Essential (primary) hypertension: Secondary | ICD-10-CM | POA: Insufficient documentation

## 2015-07-24 DIAGNOSIS — S299XXA Unspecified injury of thorax, initial encounter: Secondary | ICD-10-CM | POA: Diagnosis not present

## 2015-07-24 DIAGNOSIS — Y999 Unspecified external cause status: Secondary | ICD-10-CM | POA: Diagnosis not present

## 2015-07-24 DIAGNOSIS — S79911A Unspecified injury of right hip, initial encounter: Secondary | ICD-10-CM | POA: Diagnosis not present

## 2015-07-24 DIAGNOSIS — Y929 Unspecified place or not applicable: Secondary | ICD-10-CM | POA: Diagnosis not present

## 2015-07-24 DIAGNOSIS — M546 Pain in thoracic spine: Secondary | ICD-10-CM | POA: Diagnosis not present

## 2015-07-24 DIAGNOSIS — M25552 Pain in left hip: Secondary | ICD-10-CM | POA: Diagnosis not present

## 2015-07-24 DIAGNOSIS — M542 Cervicalgia: Secondary | ICD-10-CM | POA: Diagnosis not present

## 2015-07-24 DIAGNOSIS — S199XXA Unspecified injury of neck, initial encounter: Secondary | ICD-10-CM | POA: Diagnosis not present

## 2015-07-24 DIAGNOSIS — S79912A Unspecified injury of left hip, initial encounter: Secondary | ICD-10-CM | POA: Diagnosis not present

## 2015-07-24 DIAGNOSIS — W07XXXA Fall from chair, initial encounter: Secondary | ICD-10-CM | POA: Insufficient documentation

## 2015-07-24 DIAGNOSIS — S0990XA Unspecified injury of head, initial encounter: Secondary | ICD-10-CM | POA: Diagnosis not present

## 2015-07-24 DIAGNOSIS — W19XXXA Unspecified fall, initial encounter: Secondary | ICD-10-CM

## 2015-07-24 DIAGNOSIS — R42 Dizziness and giddiness: Secondary | ICD-10-CM | POA: Diagnosis present

## 2015-07-24 DIAGNOSIS — Z79899 Other long term (current) drug therapy: Secondary | ICD-10-CM | POA: Diagnosis not present

## 2015-07-24 HISTORY — DX: Dorsalgia, unspecified: M54.9

## 2015-07-24 HISTORY — DX: Other chronic pain: G89.29

## 2015-07-24 HISTORY — DX: Sciatica, unspecified side: M54.30

## 2015-07-24 LAB — CBC WITH DIFFERENTIAL/PLATELET
Basophils Absolute: 0 10*3/uL (ref 0.0–0.1)
Basophils Relative: 0 %
EOS ABS: 0.1 10*3/uL (ref 0.0–0.7)
EOS PCT: 1 %
HCT: 35.1 % — ABNORMAL LOW (ref 36.0–46.0)
Hemoglobin: 11.4 g/dL — ABNORMAL LOW (ref 12.0–15.0)
Lymphocytes Relative: 16 %
Lymphs Abs: 1.3 10*3/uL (ref 0.7–4.0)
MCH: 30.2 pg (ref 26.0–34.0)
MCHC: 32.5 g/dL (ref 30.0–36.0)
MCV: 93.1 fL (ref 78.0–100.0)
MONO ABS: 0.6 10*3/uL (ref 0.1–1.0)
MONOS PCT: 7 %
NEUTROS ABS: 6.5 10*3/uL (ref 1.7–7.7)
Neutrophils Relative %: 76 %
PLATELETS: 235 10*3/uL (ref 150–400)
RBC: 3.77 MIL/uL — AB (ref 3.87–5.11)
RDW: 12.3 % (ref 11.5–15.5)
WBC: 8.5 10*3/uL (ref 4.0–10.5)

## 2015-07-24 LAB — URINALYSIS, ROUTINE W REFLEX MICROSCOPIC
BILIRUBIN URINE: NEGATIVE
Glucose, UA: NEGATIVE mg/dL
Hgb urine dipstick: NEGATIVE
Ketones, ur: 40 mg/dL — AB
LEUKOCYTES UA: NEGATIVE
NITRITE: NEGATIVE
PH: 7.5 (ref 5.0–8.0)
Protein, ur: NEGATIVE mg/dL
SPECIFIC GRAVITY, URINE: 1.017 (ref 1.005–1.030)

## 2015-07-24 LAB — CBG MONITORING, ED
GLUCOSE-CAPILLARY: 88 mg/dL (ref 65–99)
GLUCOSE-CAPILLARY: 143 mg/dL — AB (ref 65–99)

## 2015-07-24 LAB — COMPREHENSIVE METABOLIC PANEL
ALT: 33 U/L (ref 14–54)
ANION GAP: 10 (ref 5–15)
AST: 46 U/L — ABNORMAL HIGH (ref 15–41)
Albumin: 3.1 g/dL — ABNORMAL LOW (ref 3.5–5.0)
Alkaline Phosphatase: 70 U/L (ref 38–126)
BUN: 18 mg/dL (ref 6–20)
CHLORIDE: 105 mmol/L (ref 101–111)
CO2: 25 mmol/L (ref 22–32)
Calcium: 9.4 mg/dL (ref 8.9–10.3)
Creatinine, Ser: 0.56 mg/dL (ref 0.44–1.00)
Glucose, Bld: 91 mg/dL (ref 65–99)
POTASSIUM: 3.2 mmol/L — AB (ref 3.5–5.1)
SODIUM: 140 mmol/L (ref 135–145)
Total Bilirubin: 1.1 mg/dL (ref 0.3–1.2)
Total Protein: 6 g/dL — ABNORMAL LOW (ref 6.5–8.1)

## 2015-07-24 LAB — TROPONIN I
TROPONIN I: 0.04 ng/mL — AB (ref <0.03)
Troponin I: 0.04 ng/mL (ref <0.03)

## 2015-07-24 LAB — CK: Total CK: 781 U/L — ABNORMAL HIGH (ref 38–234)

## 2015-07-24 MED ORDER — SODIUM CHLORIDE 0.9 % IV BOLUS (SEPSIS)
500.0000 mL | Freq: Once | INTRAVENOUS | Status: AC
  Administered 2015-07-24: 500 mL via INTRAVENOUS

## 2015-07-24 MED ORDER — IBUPROFEN 400 MG PO TABS
400.0000 mg | ORAL_TABLET | Freq: Once | ORAL | Status: AC | PRN
  Administered 2015-07-24: 400 mg via ORAL
  Filled 2015-07-24 (×2): qty 1

## 2015-07-24 MED ORDER — POTASSIUM CHLORIDE CRYS ER 20 MEQ PO TBCR
40.0000 meq | EXTENDED_RELEASE_TABLET | Freq: Once | ORAL | Status: AC
  Administered 2015-07-24: 40 meq via ORAL
  Filled 2015-07-24: qty 2

## 2015-07-24 NOTE — ED Notes (Signed)
Larey SeatFell out of chair last night while asleep.  Pt c/o pain to neck, and back and back of head.  No known LOC.  Pt requesting pain medication upon arrival.

## 2015-07-24 NOTE — ED Provider Notes (Signed)
CSN: [id number]     Arrival date & time 07/24/15  1206 History   First MD Initiated Contact with Patient 07/24/15 1355     Chief Complaint  Patient presents with  . Fall     (Consider location/radiation/quality/duration/timing/severity/associated sxs/prior Treatment) HPI   Virginia Edwards is a 82 y.o. female, with a history of Chronic back pain, extensive arthritis, and CAD, presenting to the ED For evaluation following a fall last night. Larey SeatFell out of a chair onto a carpeted floor last night with questionable LOC. States, "I feel asleep and then I was on the floor." This has happened before. Complains of bilateral hip pain as well as neck pain. Frequent falls in the last few months. Patient lives alone and is determined to keep her independence. Patient's daughter, Virginia Edwards, is at the bedside and states she visits the patient as much as she can. Patient adds that when she falls she is typically not able to get back up. Patient was apparently found by her daughter this morning. Patient denies head trauma, neuro deficits, nausea/vomiting, chest pain, shortness breath, or any other complaints.      Past Medical History  Diagnosis Date  . Hyperlipidemia   . Osteoporosis   . Arthritis   . Eczema   . Cataracts, bilateral   . Hypertension     primary Dr. Sanda Lingerhomas Edwards [phone], saw Dr. Jens Edwards x 1 cardio  . CAD (coronary artery disease)   . Chronic back pain   . Sciatica    Past Surgical History  Procedure Laterality Date  . Abdominal hysterectomy    . Fracture surgery      shoulder surgery 2008  . Lipoma excision      2011  . Total knee arthroplasty  09/27/2011    Procedure: TOTAL KNEE ARTHROPLASTY;  Surgeon: Virginia Edwards., MD;  Location: [facility] OR;  Service: Orthopedics;  Laterality: Right;  right total knee arthroplasty   Family History  Problem Relation Age of Onset  . Lung cancer Father   . Heart Problems Brother   . Heart attack Brother   . Hypertension Sister    Social  History  Substance Use Topics  . Smoking status: Never Smoker   . Smokeless tobacco: Never Used  . Alcohol Use: No   OB History    No data available     Review of Systems  Constitutional: Negative for fever and chills.  Respiratory: Negative for shortness of breath.   Cardiovascular: Negative for chest pain.  Gastrointestinal: Negative for nausea and vomiting.  Musculoskeletal: Positive for back pain (chronic), arthralgias (bilateral hip pain) and neck pain. Negative for joint swelling.  Skin: Negative for color change, pallor and wound.  Neurological: Negative for dizziness, light-headedness and headaches.  All other systems reviewed and are negative.     Allergies  Alendronate sodium and Shellfish allergy  Home Medications   Prior to Admission medications   Medication Sig Start Date End Date Taking? Authorizing Provider  aspirin EC 81 MG tablet Take 81 mg by mouth daily.   Yes Historical Provider, MD  atorvastatin (LIPITOR) 40 MG tablet Take 40 mg by mouth daily at 6 PM.   Yes Historical Provider, MD  b complex vitamins tablet Take 1 tablet by mouth daily.    Yes Historical Provider, MD  Cholecalciferol (VITAMIN D3) 1000 UNITS CAPS Take 1,000 Units by mouth daily.    Yes Historical Provider, MD  diclofenac sodium (VOLTAREN) 1 % GEL APPLY TOPICALLY FOUR TIMES DAILY 08/28/14  Yes Virginia Grandchild, MD  HYDROcodone-acetaminophen (NORCO/VICODIN) 5-325 MG tablet Take 1-2 tablets by mouth every 6 (six) hours as needed for severe pain. 07/19/15  Yes Virginia Sprout, MD  potassium chloride SA (K-DUR,KLOR-CON) 20 MEQ tablet TAKE 1 TABLET BY MOUTH TWICE DAILY 02/03/15  Yes Virginia Grandchild, MD  traMADol (ULTRAM) 50 MG tablet Take 2 tablets (100 mg total) by mouth every 6 (six) hours as needed. 02/09/15  Yes Virginia Grandchild, MD  TRIBENZOR 40-5-12.5 MG TABS TAKE 1 TABLET BY MOUTH EVERY DAY 11/02/14  Yes Virginia Grandchild, MD   BP 159/73 mmHg  Pulse 93  Temp(Src) 98.6 F (37 C) (Oral)  Resp 12   Ht  (1.575 m)  Wt 58.968 kg  BMI 23.77 kg/m2  SpO2 99% Physical Exam  Constitutional: She is oriented to person, place, and time. She appears well-developed and well-nourished. No distress.  HENT:  Head: Normocephalic and atraumatic.  Eyes: Conjunctivae and EOM are normal. Pupils are equal, round, and reactive to light.  Neck: Normal range of motion. Neck supple.  ROM deferred until after clear C-spine CT.  Cardiovascular: Normal rate, regular rhythm, normal heart sounds and intact distal pulses.   Pulmonary/Chest: Effort normal and breath sounds normal. No respiratory distress.  Abdominal: Soft. There is no tenderness. There is no guarding.  Musculoskeletal: She exhibits no edema or tenderness.  Midline cervical spine tenderness. Minor bilateral lateral hip tenderness. No bruising, deformity, swelling, or crepitus. Full ROM in all extremities and spine (c-spine ROM deferred until after clear c-spine CT). No other paraspinal tenderness.   Overall trauma exam reveals no other abnormalities other than those mentioned above.  Lymphadenopathy:    She has no cervical adenopathy.  Neurological: She is alert and oriented to person, place, and time. She has normal reflexes.  No sensory deficits. Strength 5/5 in all extremities. Coordination intact. Cranial nerves III-XII grossly intact. No facial droop.   Skin: Skin is warm and dry. She is not diaphoretic.  Psychiatric: She has a normal mood and affect. Her behavior is normal.  Nursing note and vitals reviewed.   ED Course  Procedures (including critical care time) Labs Review Labs Reviewed  CBC WITH DIFFERENTIAL/PLATELET - Abnormal; Notable for the following:    RBC 3.77 (*)    Hemoglobin 11.4 (*)    HCT 35.1 (*)    All other components within normal limits  COMPREHENSIVE METABOLIC PANEL - Abnormal; Notable for the following:    Potassium 3.2 (*)    Total Protein 6.0 (*)    Albumin 3.1 (*)    AST 46 (*)    All other components  within normal limits  URINALYSIS, ROUTINE [place] REFLEX MICROSCOPIC (NOT AT [facility]) - Abnormal; Notable for the following:    Ketones, ur 40 (*)    All other components within normal limits  TROPONIN I  CBG MONITORING, ED    Imaging Review Ct Head Wo Contrast  07/24/2015  CLINICAL DATA:  Larey Seat out of chair last night while sleeping. Neck and back pain. Pain to posterior head. No loss of consciousness. EXAM: CT HEAD WITHOUT CONTRAST CT CERVICAL SPINE WITHOUT CONTRAST TECHNIQUE: Multidetector CT imaging of the head and cervical spine was performed following the standard protocol without intravenous contrast. Multiplanar CT image reconstructions of the cervical spine were also generated. COMPARISON:  CT cervical spine 07/19/2015 FINDINGS: CT HEAD FINDINGS Ventricles, cisterns and other CSF spaces are within normal. There is mild chronic ischemic microvascular disease. No mass, mass effect,  shift of midline structures or acute hemorrhage. No evidence of acute infarction. Bony structures are within normal. There is minimal opacification over the left ethmoid and sphenoid sinus. CT CERVICAL SPINE FINDINGS Examination demonstrates straightening of the normal cervical lordosis. There is moderate spondylosis throughout the cervical spine. Moderate disc space narrowing at the C4-5 and C5-6 levels unchanged. Prevertebral soft tissues are within normal. The atlantoaxial articulations unremarkable. There is moderate uncovertebral joint spurring and facet arthropathy. There is significant bilateral neural foraminal narrowing at multiple levels due to adjacent bony spurring. There is no acute fracture or subluxation. Heterogeneity of the thyroid gland unchanged. Calcification over the carotid bifurcations. IMPRESSION: No acute intracranial findings. Minimal chronic sinus inflammatory change involving the ethmoid and left sphenoid sinus. No acute cervical spine injury. Moderate spondylosis of the cervical spine with disc disease  at the C4-5 and C5-6 levels. Significant multilevel bilateral neural foraminal narrowing. Electronically Signed   By: Elberta Fortisaniel  Boyle M.D.   On: 07/24/2015 14:37   Ct Cervical Spine Wo Contrast  07/24/2015  CLINICAL DATA:  Larey SeatFell out of chair last night while sleeping. Neck and back pain. Pain to posterior head. No loss of consciousness. EXAM: CT HEAD WITHOUT CONTRAST CT CERVICAL SPINE WITHOUT CONTRAST TECHNIQUE: Multidetector CT imaging of the head and cervical spine was performed following the standard protocol without intravenous contrast. Multiplanar CT image reconstructions of the cervical spine were also generated. COMPARISON:  CT cervical spine 07/19/2015 FINDINGS: CT HEAD FINDINGS Ventricles, cisterns and other CSF spaces are within normal. There is mild chronic ischemic microvascular disease. No mass, mass effect, shift of midline structures or acute hemorrhage. No evidence of acute infarction. Bony structures are within normal. There is minimal opacification over the left ethmoid and sphenoid sinus. CT CERVICAL SPINE FINDINGS Examination demonstrates straightening of the normal cervical lordosis. There is moderate spondylosis throughout the cervical spine. Moderate disc space narrowing at the C4-5 and C5-6 levels unchanged. Prevertebral soft tissues are within normal. The atlantoaxial articulations unremarkable. There is moderate uncovertebral joint spurring and facet arthropathy. There is significant bilateral neural foraminal narrowing at multiple levels due to adjacent bony spurring. There is no acute fracture or subluxation. Heterogeneity of the thyroid gland unchanged. Calcification over the carotid bifurcations. IMPRESSION: No acute intracranial findings. Minimal chronic sinus inflammatory change involving the ethmoid and left sphenoid sinus. No acute cervical spine injury. Moderate spondylosis of the cervical spine with disc disease at the C4-5 and C5-6 levels. Significant multilevel bilateral neural  foraminal narrowing. Electronically Signed   By: Elberta Fortisaniel  Boyle M.D.   On: 07/24/2015 14:37   Dg Hips Bilat With Pelvis 3-4 Views  07/24/2015  CLINICAL DATA:  Two recent falls EXAM: DG HIP (WITH OR WITHOUT PELVIS) 3-4V BILAT COMPARISON:  None. FINDINGS: Frontal pelvis as well as frontal and lateral views of each hip -total five views -obtained. There is no demonstrable fracture or dislocation. There is osteoarthritic change in both hip joints, symmetric. Osteoarthritic changes also noted in each sacroiliac joint. There is osteoarthritic change in the lower lumbar spine with lumbar levoscoliosis. There is atherosclerotic calcification in the aorta and both common femoral arteries. IMPRESSION: Symmetric osteoarthritic change both hip joints. Degenerative change in the sacroiliac joints as well as in the lower lumbar spine. Lower lumbar levoscoliosis. No fracture or dislocation evident. There is aortic atherosclerosis. Electronically Signed   By: Bretta BangWilliam  Woodruff III M.D.   On: 07/24/2015 14:03   I have personally reviewed and evaluated these images and lab results as part of  my medical decision-making.   EKG Interpretation   Date/Time:  Monday July 24 2015 14:40:20 EDT Ventricular Rate:  94 PR Interval:    QRS Duration: 90 QT Interval:  338 QTC Calculation: 423 R Axis:   67 Text Interpretation:  Sinus rhythm Borderline repol abnormality, diffuse  leads Confirmed by Judd Lien  MD, DOUGLAS ([id number]) on 07/24/2015 2:52:29 PM      MDM   Final diagnoses:  Mervin Hack presents for evaluation following a fall that occurred last night.  Findings and plan of care discussed with Geoffery Lyons, MD. Dr. Judd Lien personally evaluated and examined this patient. This patient was also discussed with Dr. Anitra Lauth following EDP shift change.  CT and labs initiated to search for possible reason for patient's unsteadiness and falls. No abnormalities on patient's CT or x-ray. Abnormal troponin and CK are likely  due to the patient spending many hours on the floor after her fall. She has adequate function and circulation in all her extremities. There are no changes in her kidney function.  Patient's daughter is not satisfied with the lack of lab and imaging abnormalities. She states, "Then why is she falling and why does she have pain in her hips. She has trouble getting up to go to the bathroom." Pt denies this stating she can get to the bathroom and around her house fine while using her walker. Daughter then asks about a plethora of other things that could be wrong including coronary artery occlusions, pelvic artery occlusions, PE, and DVT. The patient was non-tender in her abdomen, chest, pelvis, and legs. No swelling or redness noted to any of these areas. Good circulation noted in the extremities including good capillary refill and pulses present.  Pt will likely need further workup, but there are no pertinent, emergent tests that need to be performed here in the ED. Close PCP follow-up is indicated.  End of shift handoff report given to [employer], PA-C. Plan: Follow up on spine CT. Ambulate patient with assistance. Give instructions for close PCP follow-up.  Filed Vitals:   07/24/15 1325 07/24/15 1500 07/24/15 1600 07/24/15 1630  BP: 159/73 126/96 118/57 116/89  Pulse: 93 93 91 95  Temp:      TempSrc:      Resp: 12 17 21 14   Height:      Weight:      SpO2: 99% 99% 98% 99%      Anselm Pancoast, PA-C 07/24/15 1725  Geoffery Lyons, MD 07/25/15 [phone]

## 2015-07-24 NOTE — ED Provider Notes (Signed)
8:14 PM [facility].   Patient pending thoracic and lumbar spine CT after fall. Will need ambulated if negative.  Findings were discussed with patient and daughter at bedside. Attempted to ambulate patient. She could not walk more than 2-3 steps without two assistants. Given this, will need admission for generalized weakness.   Daughter requests [facility] as she lives in [place].   [facility] and will admit.   BP 154/77 mmHg  Pulse 91  Temp(Src) 98.6 F (37 C) (Oral)  Resp 20  Ht 5\' 2"  (1.575 m)  Wt 58.968 kg  BMI 23.77 kg/m2  SpO2 100%   Renne CriglerJoshua Talvin Christianson, PA-C 07/24/15 2016  Gwyneth SproutWhitney Plunkett, MD 07/24/15 2350

## 2015-07-24 NOTE — ED Notes (Signed)
Attempted to ambulate and pt stated she became dizzy while standing there. Pt needed 2 person assist to help her to safely ambulate back to the bed. Provider notified.

## 2015-07-24 NOTE — Discharge Instructions (Signed)
You have been seen today for evaluation following a fall. Your imaging and lab tests showed no acute or significant abnormalities.   You should follow-up with your PCP as soon as possible. The reason for your falls will likely need to be further investigated, but this is best performed by your PCP. Return to ED as needed.

## 2015-07-24 NOTE — ED Notes (Signed)
Patient transported to X-ray 

## 2015-07-24 NOTE — ED Notes (Signed)
Per Ines BloomerShawn, PA-C hold on ambulation order at this time.

## 2015-07-24 NOTE — ED Notes (Signed)
Pt placed on auto vitals Q30. Patient placed on cardiac monitor.  

## 2015-07-24 NOTE — ED Notes (Signed)
Pt given ice water, saltines, cheese, peanut butter, and graham crackers per RN's order.

## 2015-07-25 DIAGNOSIS — I340 Nonrheumatic mitral (valve) insufficiency: Secondary | ICD-10-CM | POA: Diagnosis not present

## 2015-07-25 DIAGNOSIS — R55 Syncope and collapse: Secondary | ICD-10-CM | POA: Diagnosis not present

## 2015-07-25 DIAGNOSIS — M79604 Pain in right leg: Secondary | ICD-10-CM | POA: Diagnosis not present

## 2015-07-25 DIAGNOSIS — M47812 Spondylosis without myelopathy or radiculopathy, cervical region: Secondary | ICD-10-CM | POA: Diagnosis present

## 2015-07-25 DIAGNOSIS — I10 Essential (primary) hypertension: Secondary | ICD-10-CM | POA: Diagnosis not present

## 2015-07-25 DIAGNOSIS — M6282 Rhabdomyolysis: Secondary | ICD-10-CM | POA: Diagnosis present

## 2015-07-25 DIAGNOSIS — M79605 Pain in left leg: Secondary | ICD-10-CM | POA: Diagnosis not present

## 2015-07-25 DIAGNOSIS — R531 Weakness: Secondary | ICD-10-CM | POA: Diagnosis not present

## 2015-07-25 DIAGNOSIS — Z23 Encounter for immunization: Secondary | ICD-10-CM | POA: Diagnosis not present

## 2015-07-25 DIAGNOSIS — I6523 Occlusion and stenosis of bilateral carotid arteries: Secondary | ICD-10-CM | POA: Diagnosis not present

## 2015-07-25 DIAGNOSIS — M5430 Sciatica, unspecified side: Secondary | ICD-10-CM | POA: Diagnosis not present

## 2015-07-25 DIAGNOSIS — M1990 Unspecified osteoarthritis, unspecified site: Secondary | ICD-10-CM | POA: Diagnosis not present

## 2015-07-25 DIAGNOSIS — M545 Low back pain: Secondary | ICD-10-CM | POA: Diagnosis not present

## 2015-07-25 DIAGNOSIS — M810 Age-related osteoporosis without current pathological fracture: Secondary | ICD-10-CM | POA: Diagnosis not present

## 2015-07-25 DIAGNOSIS — M5440 Lumbago with sciatica, unspecified side: Secondary | ICD-10-CM | POA: Diagnosis not present

## 2015-07-25 DIAGNOSIS — R748 Abnormal levels of other serum enzymes: Secondary | ICD-10-CM | POA: Diagnosis not present

## 2015-07-25 DIAGNOSIS — R296 Repeated falls: Secondary | ICD-10-CM | POA: Diagnosis not present

## 2015-07-25 DIAGNOSIS — I2510 Atherosclerotic heart disease of native coronary artery without angina pectoris: Secondary | ICD-10-CM | POA: Diagnosis present

## 2015-07-25 DIAGNOSIS — E785 Hyperlipidemia, unspecified: Secondary | ICD-10-CM | POA: Diagnosis present

## 2015-07-25 DIAGNOSIS — E876 Hypokalemia: Secondary | ICD-10-CM | POA: Diagnosis not present

## 2015-07-25 DIAGNOSIS — W19XXXA Unspecified fall, initial encounter: Secondary | ICD-10-CM | POA: Diagnosis not present

## 2015-07-25 DIAGNOSIS — Z7982 Long term (current) use of aspirin: Secondary | ICD-10-CM | POA: Diagnosis not present

## 2015-07-25 DIAGNOSIS — G8929 Other chronic pain: Secondary | ICD-10-CM | POA: Diagnosis not present

## 2015-07-26 ENCOUNTER — Other Ambulatory Visit: Payer: Self-pay | Admitting: Internal Medicine

## 2015-07-27 ENCOUNTER — Telehealth: Payer: Self-pay | Admitting: Internal Medicine

## 2015-07-27 NOTE — Telephone Encounter (Signed)
Faxed script back to [facility].../lmb 

## 2015-07-27 NOTE — Telephone Encounter (Signed)
[facility] - Client TELEPHONE ADVICE RECORD [facility] Patient Name: Virginia RegulusJANET Huge DOB: 07/07/1925 Initial Comment Caller states mom was falling a couple of wks ago, called ambulance, low blood pressure and arthritis pain, discharge today and is still confuse as well as in pain. Nurse Assessment Nurse: Virginia Edwards Date/Time Virginia Edwards(Eastern Time): 07/27/2015 4:37:45 PM Confirm and document reason for call. If symptomatic, describe symptoms. You must click the next button to save text entered. ---Caller reports her [facility] Has low BP and is in pain. Today seems weaker and more confused. [facility]. Has orders for P.T., skilled nursing and O.T. orders and home care referral has not visited yet today. To [facility] by [facility]. daughter states she seems to be more confused and weaker. Not sure of where she is, doe not know day and time. Today reported to daughter she felt like she died yesterday. BP / 40 diastolic. Takes following medications Tramadol 1 every 6 Hours, 1 given before discharge, Potassium ( Has not been taking ) a.s.a daily Statin Med., Olmesartan/ Amlodipine. Has the patient traveled out of the country within the last 30 days? ---No Does the patient have any new or worsening symptoms? ---Yes Will a triage be completed? ---Yes Related visit to physician within the last 2 weeks? ---Yes Does the PT have any chronic conditions? (i.e. diabetes, asthma, etc.) ---Yes List chronic conditions. ---High Blood pressure history, High Cholesterol, narrowing in back that Pinches on nerve. Arthritis in neck. Is this a behavioral health or substance abuse call? ---No Guidelines Guideline Title Affirmed Question Affirmed Notes Post-Hospitalization Follow-up Call [1] Caller has URGENT question AND [2] triager unable to answer question Final Disposition  User Call PCP Now Virginia Edwards Comments Attempted to schedule appointment and none available Caller informed of MD instructions. States Home health will be out to visit Friday. Advised to call back for any questions or take to ED if symptoms worsen. Verbalized understanding PLEASE NOTE: All timestamps contained within this report are represented as [place] Standard Time. CONFIDENTIALTY NOTICE: This fax transmission is intended only for the addressee. It contains information that is legally privileged, confidential or otherwise protected from use or disclosure. If you are not the intended recipient, you are strictly prohibited from reviewing, disclosing, copying using or disseminating any of this information or taking any action in reliance on or regarding this information. If you have received this fax in error, please notify us immediately by telephone so that we can arrange for its return to us. Phone: [phone], Toll-Free: [phone], Fax: [phone] Page: 2 of 2 Call Id: [id number] Referrals GO TO FACILITY UNDECIDED Disagree/Comply: Danella Maiersomply

## 2015-07-28 ENCOUNTER — Telehealth: Payer: Self-pay | Admitting: Internal Medicine

## 2015-07-28 NOTE — Telephone Encounter (Signed)
Delaney [facility]igsamara [facility]----tramadol rx ordered by dr Yetta Barrejones on 07/27/15 [facility]---

## 2015-07-28 NOTE — Telephone Encounter (Signed)
Patient Called again saying that the rx was not at the pharmacy. Please follow up. Thank you.

## 2015-07-28 NOTE — Telephone Encounter (Signed)
Tramadol now would be too soon, as last rx was per Dr Yetta BarreJones on July 6, thanks

## 2015-07-28 NOTE — Telephone Encounter (Signed)
Please advise in dr jones absence, thanks 

## 2015-07-28 NOTE — Telephone Encounter (Signed)
Patient called back and stated she needed a tramadol prescription. I explained [facility] She said the pharmacy never received it. Please follow up thanks.

## 2015-07-29 DIAGNOSIS — R42 Dizziness and giddiness: Secondary | ICD-10-CM | POA: Diagnosis not present

## 2015-07-30 DIAGNOSIS — E785 Hyperlipidemia, unspecified: Secondary | ICD-10-CM | POA: Diagnosis not present

## 2015-07-30 DIAGNOSIS — R296 Repeated falls: Secondary | ICD-10-CM | POA: Diagnosis not present

## 2015-07-30 DIAGNOSIS — I2510 Atherosclerotic heart disease of native coronary artery without angina pectoris: Secondary | ICD-10-CM | POA: Diagnosis not present

## 2015-07-30 DIAGNOSIS — Z7982 Long term (current) use of aspirin: Secondary | ICD-10-CM | POA: Diagnosis not present

## 2015-07-30 DIAGNOSIS — M150 Primary generalized (osteo)arthritis: Secondary | ICD-10-CM | POA: Diagnosis not present

## 2015-07-30 DIAGNOSIS — M810 Age-related osteoporosis without current pathological fracture: Secondary | ICD-10-CM | POA: Diagnosis not present

## 2015-07-30 DIAGNOSIS — I10 Essential (primary) hypertension: Secondary | ICD-10-CM | POA: Diagnosis not present

## 2015-08-02 ENCOUNTER — Emergency Department (HOSPITAL_COMMUNITY): Payer: Medicare Other

## 2015-08-02 ENCOUNTER — Encounter (HOSPITAL_COMMUNITY): Payer: Self-pay | Admitting: Emergency Medicine

## 2015-08-02 ENCOUNTER — Emergency Department (HOSPITAL_COMMUNITY)
Admission: EM | Admit: 2015-08-02 | Discharge: 2015-08-02 | Disposition: A | Discharge status: Home / Self Care | Payer: Medicare Other | Attending: Emergency Medicine | Admitting: Emergency Medicine

## 2015-08-02 ENCOUNTER — Telehealth: Payer: Self-pay | Admitting: Emergency Medicine

## 2015-08-02 DIAGNOSIS — S7002XA Contusion of left hip, initial encounter: Secondary | ICD-10-CM | POA: Diagnosis not present

## 2015-08-02 DIAGNOSIS — Z96651 Presence of right artificial knee joint: Secondary | ICD-10-CM | POA: Insufficient documentation

## 2015-08-02 DIAGNOSIS — W19XXXA Unspecified fall, initial encounter: Secondary | ICD-10-CM

## 2015-08-02 DIAGNOSIS — S7012XA Contusion of left thigh, initial encounter: Secondary | ICD-10-CM

## 2015-08-02 DIAGNOSIS — W1830XA Fall on same level, unspecified, initial encounter: Secondary | ICD-10-CM | POA: Insufficient documentation

## 2015-08-02 DIAGNOSIS — Y929 Unspecified place or not applicable: Secondary | ICD-10-CM | POA: Diagnosis not present

## 2015-08-02 DIAGNOSIS — S79912A Unspecified injury of left hip, initial encounter: Secondary | ICD-10-CM | POA: Diagnosis present

## 2015-08-02 DIAGNOSIS — Y939 Activity, unspecified: Secondary | ICD-10-CM | POA: Insufficient documentation

## 2015-08-02 DIAGNOSIS — Y999 Unspecified external cause status: Secondary | ICD-10-CM | POA: Insufficient documentation

## 2015-08-02 DIAGNOSIS — Z7982 Long term (current) use of aspirin: Secondary | ICD-10-CM | POA: Insufficient documentation

## 2015-08-02 DIAGNOSIS — I2510 Atherosclerotic heart disease of native coronary artery without angina pectoris: Secondary | ICD-10-CM | POA: Insufficient documentation

## 2015-08-02 DIAGNOSIS — M25551 Pain in right hip: Secondary | ICD-10-CM | POA: Diagnosis not present

## 2015-08-02 DIAGNOSIS — I10 Essential (primary) hypertension: Secondary | ICD-10-CM | POA: Diagnosis not present

## 2015-08-02 DIAGNOSIS — S0990XA Unspecified injury of head, initial encounter: Secondary | ICD-10-CM | POA: Diagnosis not present

## 2015-08-02 DIAGNOSIS — S199XXA Unspecified injury of neck, initial encounter: Secondary | ICD-10-CM | POA: Diagnosis not present

## 2015-08-02 DIAGNOSIS — T148 Other injury of unspecified body region: Secondary | ICD-10-CM | POA: Diagnosis not present

## 2015-08-02 DIAGNOSIS — M25552 Pain in left hip: Secondary | ICD-10-CM | POA: Diagnosis not present

## 2015-08-02 LAB — URINE MICROSCOPIC-ADD ON

## 2015-08-02 LAB — URINALYSIS, ROUTINE W REFLEX MICROSCOPIC
Bilirubin Urine: NEGATIVE
Glucose, UA: NEGATIVE mg/dL
KETONES UR: NEGATIVE mg/dL
LEUKOCYTES UA: NEGATIVE
NITRITE: NEGATIVE
PH: 6.5 (ref 5.0–8.0)
Protein, ur: NEGATIVE mg/dL
SPECIFIC GRAVITY, URINE: 1.012 (ref 1.005–1.030)

## 2015-08-02 LAB — COMPREHENSIVE METABOLIC PANEL
ALBUMIN: 2.8 g/dL — AB (ref 3.5–5.0)
ALT: 21 U/L (ref 14–54)
ANION GAP: 6 (ref 5–15)
AST: 26 U/L (ref 15–41)
Alkaline Phosphatase: 65 U/L (ref 38–126)
BUN: 37 mg/dL — AB (ref 6–20)
CO2: 26 mmol/L (ref 22–32)
Calcium: 9.4 mg/dL (ref 8.9–10.3)
Chloride: 103 mmol/L (ref 101–111)
Creatinine, Ser: 0.84 mg/dL (ref 0.44–1.00)
GFR calc Af Amer: >60 mL/min (ref >60)
GFR calc non Af Amer: >60 mL/min (ref >60)
GLUCOSE: 97 mg/dL (ref 65–99)
POTASSIUM: 3.8 mmol/L (ref 3.5–5.1)
Sodium: 135 mmol/L (ref 135–145)
Total Bilirubin: 1.1 mg/dL (ref 0.3–1.2)
Total Protein: 5.6 g/dL — ABNORMAL LOW (ref 6.5–8.1)

## 2015-08-02 LAB — CBC WITH DIFFERENTIAL/PLATELET
Basophils Absolute: 0 10*3/uL (ref 0.0–0.1)
Basophils Relative: 0 %
EOS PCT: 1 %
Eosinophils Absolute: 0.1 10*3/uL (ref 0.0–0.7)
HEMATOCRIT: 31.6 % — AB (ref 36.0–46.0)
Hemoglobin: 10 g/dL — ABNORMAL LOW (ref 12.0–15.0)
LYMPHS PCT: 20 %
Lymphs Abs: 1.6 10*3/uL (ref 0.7–4.0)
MCH: 29.6 pg (ref 26.0–34.0)
MCHC: 31.6 g/dL (ref 30.0–36.0)
MCV: 93.5 fL (ref 78.0–100.0)
MONO ABS: 0.8 10*3/uL (ref 0.1–1.0)
MONOS PCT: 9 %
NEUTROS ABS: 5.7 10*3/uL (ref 1.7–7.7)
Neutrophils Relative %: 70 %
PLATELETS: 275 10*3/uL (ref 150–400)
RBC: 3.38 MIL/uL — ABNORMAL LOW (ref 3.87–5.11)
RDW: 13.2 % (ref 11.5–15.5)
WBC: 8.2 10*3/uL (ref 4.0–10.5)

## 2015-08-02 LAB — CK: Total CK: 475 U/L — ABNORMAL HIGH (ref 38–234)

## 2015-08-02 MED ORDER — TRAMADOL HCL 50 MG PO TABS
100.0000 mg | ORAL_TABLET | Freq: Once | ORAL | Status: AC
  Administered 2015-08-02: 100 mg via ORAL
  Filled 2015-08-02: qty 2

## 2015-08-02 MED ORDER — SODIUM CHLORIDE 0.9 % IV BOLUS (SEPSIS)
1000.0000 mL | Freq: Once | INTRAVENOUS | Status: AC
  Administered 2015-08-02: 1000 mL via INTRAVENOUS

## 2015-08-02 MED ORDER — FENTANYL CITRATE (PF) 100 MCG/2ML IJ SOLN
50.0000 ug | Freq: Once | INTRAMUSCULAR | Status: DC
  Filled 2015-08-02: qty 2

## 2015-08-02 NOTE — ED Notes (Signed)
Pt transported to CT ?

## 2015-08-02 NOTE — Telephone Encounter (Signed)
This pt is now in the ER due to the meds.  Daughter is wanting a urgent call back from a nurse

## 2015-08-02 NOTE — Discharge Instructions (Signed)
Please return without fail for worsening symptoms, including worsening pain, inability to walk, confusion, passing out, or any other symptoms concerning to you.   Your CT scan of the head and neck do not sure serious injury. The Ct of the hip does not show broken bone, but you have small collection of blood in the muscle. Continue taking your home pain medications as needed.  Follow-up closely with your primary care doctor for re-evaluation.

## 2015-08-02 NOTE — ED Notes (Signed)
Pt refusing CT scan  

## 2015-08-02 NOTE — ED Notes (Signed)
Pt ambulated in hallway wih walker no issues or complaints.

## 2015-08-02 NOTE — ED Notes (Signed)
Per [facility], pt from home, trying to get out of bed and collapsed due to pain from her legs. Pt c/o pain on R side. BP initial 140/100, RR 18, 86 HR. No obvious injures or deformities.

## 2015-08-02 NOTE — ED Notes (Signed)
Denies hitting head, denies LOC. Conscious the whole time. Pt on the floor for about 4 hours.

## 2015-08-02 NOTE — ED Notes (Signed)
Pt returned from xray

## 2015-08-02 NOTE — Telephone Encounter (Signed)
Pts daughter called and is concerned about pt medication. Pt not acting right lately. Please give her a call back thanks.

## 2015-08-02 NOTE — ED Provider Notes (Signed)
CSN: [id number]     Arrival date & time 08/02/15  1527 History   First MD Initiated Contact with Patient 08/02/15 1559     Chief Complaint  Patient presents with  . Fall     (Consider location/radiation/quality/duration/timing/severity/associated sxs/prior Treatment) HPI 82 year old female who presents after a fall. She has a history of CAD, hypertension, hyperlipidemia, chronic low back pain and sciatica. She is not fully reliable historian. States that she got out of bed this morning and was on the floor for "4 hours." She is unsure of when she fell, but states she was found by either her daughter or home health nurse. Initially states that she was awake the whole time, but then states she is unsure of LOC. Was unable to get up by herself.  She does not remember tripping and falling. Denies any associating chest pain, shortness of breath, lightheadedness, nausea, or vomiting. She does not think that she hit her head. Currently complains of left hip pain. Denies headache, back pain or neck pain, chest pain, or abdominal pain.  Patient recently was hospitalized at [facility] earlier this month after a fall. Upon discharge, I was recommended she go to a skilled nursing facility, but she had preferred to be discharged home and currently has home health.  Past Medical History  Diagnosis Date  . Hyperlipidemia   . Osteoporosis   . Arthritis   . Eczema   . Cataracts, bilateral   . Hypertension     primary Dr. Sanda Lingerhomas Jones [phone], saw Dr. Jens Somrenshaw x 1 cardio  . CAD (coronary artery disease)   . Chronic back pain   . Sciatica    Past Surgical History  Procedure Laterality Date  . Abdominal hysterectomy    . Fracture surgery      shoulder surgery 2008  . Lipoma excision      2011  . Total knee arthroplasty  09/27/2011    Procedure: TOTAL KNEE ARTHROPLASTY;  Surgeon: Thera FlakeW D Caffrey Jr., MD;  Location: [facility] OR;  Service: Orthopedics;  Laterality: Right;  right total knee  arthroplasty   Family History  Problem Relation Age of Onset  . Lung cancer Father   . Heart Problems Brother   . Heart attack Brother   . Hypertension Sister    Social History  Substance Use Topics  . Smoking status: Never Smoker   . Smokeless tobacco: Never Used  . Alcohol Use: No   OB History    No data available     Review of Systems 10/14 systems reviewed and are negative other than those stated in the HPI    Allergies  Alendronate sodium and Shellfish allergy  Home Medications   Prior to Admission medications   Medication Sig Start Date End Date Taking? Authorizing Provider  aspirin EC 81 MG tablet Take 81 mg by mouth daily.    Historical Provider, MD  atorvastatin (LIPITOR) 40 MG tablet Take 40 mg by mouth daily at 6 PM.    Historical Provider, MD  b complex vitamins tablet Take 1 tablet by mouth daily.     Historical Provider, MD  Cholecalciferol (VITAMIN D3) 1000 UNITS CAPS Take 1,000 Units by mouth daily.     Historical Provider, MD  diclofenac sodium (VOLTAREN) 1 % GEL APPLY TOPICALLY FOUR TIMES DAILY 08/28/14   Etta Grandchildhomas L Jones, MD  HYDROcodone-acetaminophen (NORCO/VICODIN) 5-325 MG tablet Take 1-2 tablets by mouth every 6 (six) hours as needed for severe pain. 07/19/15   Gwyneth SproutWhitney Plunkett, MD  potassium chloride SA (K-DUR,KLOR-CON) 20 MEQ tablet TAKE 1 TABLET BY MOUTH TWICE DAILY 02/03/15   Etta Grandchild, MD  traMADol (ULTRAM) 50 MG tablet TAKE 2 TABLETS BY MOUTH EVERY 6 HOURS AS NEEDED 07/27/15   Etta Grandchild, MD  TRIBENZOR 40-5-12.5 MG TABS TAKE 1 TABLET BY MOUTH EVERY DAY 11/02/14   Etta Grandchild, MD   BP 125/76 mmHg  Pulse 81  Temp(Src) 98.6 F (37 C)  Resp 19  SpO2 95% Physical Exam Physical Exam  Nursing note and vitals reviewed. Constitutional: elderly woman, well developed, well nourished, non-toxic, and in no acute distress Head: Normocephalic and atraumatic.  Mouth/Throat: Oropharynx is clear and moist.  Neck: Normal range of motion. Neck supple.  No cervical spinet tenderness Cardiovascular: Normal rate and regular rhythm.   Pulmonary/Chest: Effort normal and breath sounds normal. No chest wall tenderness Abdominal: Soft. There is no tenderness. There is no rebound and no guarding.  Musculoskeletal: No appreciable deformities. Tender to palpation over the left hip.   Neurological: Alert, no facial droop, fluent speech, moves all extremities symmetrically, sensation to light touch in tact throughout, PERRL Skin: Skin is warm and dry.  Psychiatric: Cooperative  ED Course  Procedures (including critical care time) Labs Review Labs Reviewed  CBC WITH DIFFERENTIAL/PLATELET - Abnormal; Notable for the following:    RBC 3.38 (*)    Hemoglobin 10.0 (*)    HCT 31.6 (*)    All other components within normal limits  COMPREHENSIVE METABOLIC PANEL - Abnormal; Notable for the following:    BUN 37 (*)    Total Protein 5.6 (*)    Albumin 2.8 (*)    All other components within normal limits  CK - Abnormal; Notable for the following:    Total CK 475 (*)    All other components within normal limits  URINALYSIS, ROUTINE W REFLEX MICROSCOPIC (NOT AT [facility]) - Abnormal; Notable for the following:    Hgb urine dipstick TRACE (*)    All other components within normal limits  URINE MICROSCOPIC-ADD ON - Abnormal; Notable for the following:    Squamous Epithelial / LPF 0-5 (*)    Bacteria, UA RARE (*)    Casts HYALINE CASTS (*)    All other components within normal limits    Imaging Review Ct Head Wo Contrast  08/02/2015  CLINICAL DATA:  Fall with trauma to head. Possible loss of consciousness. EXAM: CT HEAD WITHOUT CONTRAST CT CERVICAL SPINE WITHOUT CONTRAST TECHNIQUE: Multidetector CT imaging of the head and cervical spine was performed following the standard protocol without intravenous contrast. Multiplanar CT image reconstructions of the cervical spine were also generated. COMPARISON:  CT head and cervical spine [facility]  07/24/2015. FINDINGS: CT HEAD FINDINGS Mild atrophy and white matter disease is again noted. No acute infarct, hemorrhage, or mass lesion is present. The ventricles are proportionate to the degree of atrophy. There is no significant extra-axial fluid collection. No significant extracranial soft tissue lesion is present. A left lens replacement is noted. The globes and orbits are otherwise intact. CT CERVICAL SPINE FINDINGS The cervical spine is imaged from the skullbase through T2-3. Grade 1 anterolisthesis at C3-4 is stable. Uncovertebral and facet hypertrophy contribute to multilevel foraminal stenosis. Osseous foraminal narrowing is most severe on the right at C3-4, C4-5, and C5-6. Left-sided disease is most significant at C4-5. No acute fracture traumatic subluxation is present. The soft tissues of the neck are unremarkable. The lung apices are clear. IMPRESSION: 1. No  acute intracranial abnormality or significant interval change. 2. Stable atrophy and white matter disease. 3. Stable multilevel spondylosis of the cervical spine without evidence for acute trauma. Electronically Signed   By: Marin Roberts M.D.   On: 08/02/2015 17:37   Ct Cervical Spine Wo Contrast  08/02/2015  CLINICAL DATA:  Fall with trauma to head. Possible loss of consciousness. EXAM: CT HEAD WITHOUT CONTRAST CT CERVICAL SPINE WITHOUT CONTRAST TECHNIQUE: Multidetector CT imaging of the head and cervical spine was performed following the standard protocol without intravenous contrast. Multiplanar CT image reconstructions of the cervical spine were also generated. COMPARISON:  CT head and cervical spine [facility] 07/24/2015. FINDINGS: CT HEAD FINDINGS Mild atrophy and white matter disease is again noted. No acute infarct, hemorrhage, or mass lesion is present. The ventricles are proportionate to the degree of atrophy. There is no significant extra-axial fluid collection. No significant extracranial soft tissue lesion  is present. A left lens replacement is noted. The globes and orbits are otherwise intact. CT CERVICAL SPINE FINDINGS The cervical spine is imaged from the skullbase through T2-3. Grade 1 anterolisthesis at C3-4 is stable. Uncovertebral and facet hypertrophy contribute to multilevel foraminal stenosis. Osseous foraminal narrowing is most severe on the right at C3-4, C4-5, and C5-6. Left-sided disease is most significant at C4-5. No acute fracture traumatic subluxation is present. The soft tissues of the neck are unremarkable. The lung apices are clear. IMPRESSION: 1. No acute intracranial abnormality or significant interval change. 2. Stable atrophy and white matter disease. 3. Stable multilevel spondylosis of the cervical spine without evidence for acute trauma. Electronically Signed   By: Marin Roberts M.D.   On: 08/02/2015 17:37   Ct Hip Left Wo Contrast  08/02/2015  CLINICAL DATA:  82 year old female with fall with left hip pain. EXAM: CT OF THE LEFT HIP WITHOUT CONTRAST TECHNIQUE: Multidetector CT imaging of the left hip was performed according to the standard protocol. Multiplanar CT image reconstructions were also generated. COMPARISON:  Radiograph dated 08/02/2015 FINDINGS: There is no acute fracture or dislocation. The bones are osteopenic. There is moderate osteoarthritic changes of the hips. There is a small intramuscular hematoma involving the left psoas major, and iliacus muscles along the left pelvic sidewall as well as anterior to the femoral head. No drainable fluid collection. There is diffuse stranding of the deep fascia of the left thigh. IMPRESSION: No acute fracture or dislocation. Small intramuscular hematoma involving the left psoas and iliacus muscles. Electronically Signed   By: Elgie Collard M.D.   On: 08/02/2015 20:11   Dg Hip Unilat With Pelvis 2-3 Views Left  08/02/2015  CLINICAL DATA:  82 year old female with acute left hip pain following fall today. Initial encounter.  EXAM: DG HIP (WITH OR WITHOUT PELVIS) 2-3V LEFT COMPARISON:  07/24/2015 radiographs FINDINGS: There is no evidence of acute fracture, subluxation or dislocation. Mild degenerative changes within both hips are again noted. No focal bony lesions are identified. IMPRESSION: No evidence of acute bony abnormality. Electronically Signed   By: Harmon Pier M.D.   On: 08/02/2015 17:02   I have personally reviewed and evaluated these images and lab results as part of my medical decision-making.   EKG Interpretation   Date/Time:  Wednesday August 02 2015 15:35:47 EDT Ventricular Rate:  99 PR Interval:    QRS Duration: 94 QT Interval:  339 QTC Calculation: 435 R Axis:   65 Text Interpretation:  Sinus rhythm Consider anterior infarct EKG unchanged  from prior EKG 07/24/2015  Confirmed by Finian Helvey MD, Annabelle Harman [phone]) on 08/02/2015  6:37:36 PM      MDM   Final diagnoses:  Fall, initial encounter  Iliopsoas muscle hematoma, left, initial encounter    Presenting after fall. Baseline mental status on arrival. C/o primarily of left hip pain, but states she also has this with sciatica. Extremity NV in tact. No other injuries noted on exam. CT head and neck negative. XR hip/pelvis unremarkable, but still have significant pain. CT hip negative for fracture but w/ small hematoma in left psoas and iliacus muscle. Her pain is controlled with home tramadol and she is able to ambulate steadily with her walker here in the ED. She is requesting discharge home and does not want to be admitted. I think this is reasonable. She was recently admitted this month at [facility] and underwent syncopal workup as well including carotid ultrasounds and echo that was overall unremarkable. She is able to ambulate and continues to have home health. The daughter is taking care of her as well. Strict return and follow-up instructions reviewed with patient and her daughter. They expressed understanding of all discharge instructions and felt  comfortable with the plan of care.     Lavera Guise, MD 08/02/15 2149

## 2015-08-03 NOTE — Telephone Encounter (Signed)
Lm for Virginia Edwards to call back

## 2015-08-04 NOTE — Telephone Encounter (Signed)
Spoke to pt dtr and she stated that she wanted to know if there was another alternative to the pain medications she is currently taking.   Advised OV was due. appt has been scheduled. Pt dtr stated that she may need to reschedule if pt doesn't agree.

## 2015-08-08 ENCOUNTER — Telehealth: Payer: Self-pay

## 2015-08-08 NOTE — Telephone Encounter (Signed)
[facility] called and just wanted us to be aware that Virginia Edwards is refusing home health at this time. They will follow back up with her.

## 2015-08-09 NOTE — Telephone Encounter (Signed)
[facility] called and cant reach pt to set up OT . She has spoke with pts daughter and the daughter cant get the pt to call [facility] back. So [facility] is going to discharge OT orders. Just wanted you to be informed. Thanks.

## 2015-08-14 ENCOUNTER — Ambulatory Visit (INDEPENDENT_AMBULATORY_CARE_PROVIDER_SITE_OTHER): Payer: Medicare Other | Admitting: Internal Medicine

## 2015-08-14 ENCOUNTER — Encounter: Payer: Self-pay | Admitting: Internal Medicine

## 2015-08-14 VITALS — BP 118/62 | HR 76 | Temp 98.7°F | Resp 20 | Wt 131.0 lb

## 2015-08-14 DIAGNOSIS — F0390 Unspecified dementia without behavioral disturbance: Secondary | ICD-10-CM

## 2015-08-14 DIAGNOSIS — M170 Bilateral primary osteoarthritis of knee: Secondary | ICD-10-CM

## 2015-08-14 DIAGNOSIS — R296 Repeated falls: Secondary | ICD-10-CM | POA: Diagnosis not present

## 2015-08-14 DIAGNOSIS — I10 Essential (primary) hypertension: Secondary | ICD-10-CM

## 2015-08-14 DIAGNOSIS — I2510 Atherosclerotic heart disease of native coronary artery without angina pectoris: Secondary | ICD-10-CM

## 2015-08-14 MED ORDER — AZILSARTAN-CHLORTHALIDONE 40-12.5 MG PO TABS: 1.0000 | ORAL_TABLET | Freq: Every day | ORAL | 3 refills | Status: DC

## 2015-08-14 MED ORDER — BUPRENORPHINE 15 MCG/HR TD PTWK: 1.0000 | MEDICATED_PATCH | TRANSDERMAL | 5 refills | Status: DC

## 2015-08-14 NOTE — Patient Instructions (Signed)

## 2015-08-14 NOTE — Progress Notes (Signed)
Subjective:  Patient ID: Virginia Edwards, female    DOB: [birth year]  Age: 82 y.o. MRN: [id number]  CC: Hypertension and Osteoarthritis   HPI Ka Bench presents for follow-up after several admissions for frequent falls. She complains that when she takes tramadol it makes her feel dizzy and sleepy. She recently tried hydrocodone but claims it caused her to hallucinate. She thinks these meds contribute to her recent falls. She has persistent bilateral knee pain and wants an option other than tramadol to take for her pain. She lives by herself. Her daughter is with her today. She has developed age-related dementia and has trouble with forgetfulness. No one is monitoring her medication use.  She also complains that her blood pressure might be a little too low and she complains of redness and swelling in her ankles and feet.  Outpatient Medications Prior to Visit  Medication Sig Dispense Refill  . aspirin EC 81 MG tablet Take 81 mg by mouth daily.    Marland Kitchen atorvastatin (LIPITOR) 40 MG tablet Take 40 mg by mouth daily at 6 PM.    . b complex vitamins tablet Take 1 tablet by mouth daily.     . Cholecalciferol (VITAMIN D3) 1000 UNITS CAPS Take 1,000 Units by mouth daily.     . diclofenac sodium (VOLTAREN) 1 % GEL APPLY TOPICALLY FOUR TIMES DAILY 100 g 11  . potassium chloride SA (K-DUR,KLOR-CON) 20 MEQ tablet TAKE 1 TABLET BY MOUTH TWICE DAILY 60 tablet 3  . HYDROcodone-acetaminophen (NORCO/VICODIN) 5-325 MG tablet Take 1-2 tablets by mouth every 6 (six) hours as needed for severe pain. 20 tablet 0  . traMADol (ULTRAM) 50 MG tablet TAKE 2 TABLETS BY MOUTH EVERY 6 HOURS AS NEEDED 120 tablet 3  . TRIBENZOR 40-5-12.5 MG TABS TAKE 1 TABLET BY MOUTH EVERY DAY 90 tablet 2   No facility-administered medications prior to visit.     ROS Review of Systems  Constitutional: Positive for activity change. Negative for appetite change, fatigue and unexpected weight change.  HENT: Negative.  Negative for trouble  swallowing.   Eyes: Negative for visual disturbance.  Respiratory: Negative.  Negative for apnea, cough, choking, chest tightness, shortness of breath, wheezing and stridor.   Cardiovascular: Positive for leg swelling. Negative for chest pain and palpitations.  Gastrointestinal: Negative.  Negative for abdominal pain, constipation, diarrhea, nausea and vomiting.  Endocrine: Negative.   Genitourinary: Negative.  Negative for decreased urine volume, difficulty urinating, dysuria and urgency.  Musculoskeletal: Positive for arthralgias, back pain and gait problem. Negative for joint swelling and myalgias.  Skin: Positive for color change. Negative for pallor, rash and wound.  Allergic/Immunologic: Negative.   Neurological: Positive for dizziness and light-headedness. Negative for weakness and numbness.  Psychiatric/Behavioral: Positive for confusion and decreased concentration. Negative for agitation, sleep disturbance and suicidal ideas. The patient is not nervous/anxious.     Objective:  BP 118/62   Pulse 76   Temp 98.7 F (37.1 C) (Oral)   Resp 20   Wt 131 lb (59.4 kg)   SpO2 98%   BMI 23.96 kg/m   BP Readings from Last 3 Encounters:  08/14/15 118/62  08/02/15 125/76  07/24/15 117/62    Wt Readings from Last 3 Encounters:  08/14/15 131 lb (59.4 kg)  07/24/15 130 lb (59 kg)  07/19/15 133 lb (60.3 kg)    Physical Exam  Constitutional: She is oriented to person, place, and time. She has a sickly appearance. She appears ill. No distress.  Wheelchair-bound  HENT:  Mouth/Throat: Oropharynx is clear and moist.  Eyes: Conjunctivae are normal. Right eye exhibits no discharge. Left eye exhibits no discharge. No scleral icterus.  Neck: Normal range of motion. Neck supple. No JVD present. No tracheal deviation present. No thyromegaly present.  Cardiovascular: Normal rate, regular rhythm, S1 normal, S2 normal and intact distal pulses.  Exam reveals gallop. Exam reveals no S3, no S4 and  no friction rub.   Murmur heard.  Systolic murmur is present with a grade of 2/6   No diastolic murmur is present  2/6 SEM  Pulmonary/Chest: Effort normal and breath sounds normal. No stridor. No respiratory distress. She has no wheezes. She has no rales. She exhibits no tenderness.  Abdominal: Soft. Bowel sounds are normal. She exhibits no distension and no mass. There is no tenderness. There is no rebound and no guarding.  Musculoskeletal: Normal range of motion. She exhibits edema. She exhibits no tenderness or deformity.  There is 2+ pitting edema with faint erythema from her ankles into her feet  Lymphadenopathy:    She has no cervical adenopathy.  Neurological: She is oriented to person, place, and time.  Skin: Skin is warm and dry. No rash noted. She is not diaphoretic. No erythema. No pallor.  Psychiatric: Thought content normal. Her mood appears anxious. Her speech is delayed and tangential. Her speech is not rapid and/or pressured and not slurred. She is slowed. She is not withdrawn. Cognition and memory are impaired. She does not exhibit a depressed mood. She exhibits abnormal recent memory and abnormal remote memory. She is inattentive.  Vitals reviewed.   Lab Results  Component Value Date   WBC 8.2 08/02/2015   HGB 10.0 (L) 08/02/2015   HCT 31.6 (L) 08/02/2015   PLT 275 08/02/2015   GLUCOSE 97 08/02/2015   CHOL 194 09/22/2014   TRIG 76.0 09/22/2014   HDL 59.30 09/22/2014   LDLDIRECT 201.5 07/05/2010   LDLCALC 119 (H) 09/22/2014   ALT 21 08/02/2015   AST 26 08/02/2015   NA 135 08/02/2015   K 3.8 08/02/2015   CL 103 08/02/2015   CREATININE 0.84 08/02/2015   BUN 37 (H) 08/02/2015   CO2 26 08/02/2015   TSH 2.53 09/22/2014   INR 0.94 10/02/2011   HGBA1C 5.8 09/22/2014    Ct Head Wo Contrast  Result Date: 08/02/2015 CLINICAL DATA:  Fall with trauma to head. Possible loss of consciousness. EXAM: CT HEAD WITHOUT CONTRAST CT CERVICAL SPINE WITHOUT CONTRAST TECHNIQUE:  Multidetector CT imaging of the head and cervical spine was performed following the standard protocol without intravenous contrast. Multiplanar CT image reconstructions of the cervical spine were also generated. COMPARISON:  CT head and cervical spine [facility] 07/24/2015. FINDINGS: CT HEAD FINDINGS Mild atrophy and white matter disease is again noted. No acute infarct, hemorrhage, or mass lesion is present. The ventricles are proportionate to the degree of atrophy. There is no significant extra-axial fluid collection. No significant extracranial soft tissue lesion is present. A left lens replacement is noted. The globes and orbits are otherwise intact. CT CERVICAL SPINE FINDINGS The cervical spine is imaged from the skullbase through T2-3. Grade 1 anterolisthesis at C3-4 is stable. Uncovertebral and facet hypertrophy contribute to multilevel foraminal stenosis. Osseous foraminal narrowing is most severe on the right at C3-4, C4-5, and C5-6. Left-sided disease is most significant at C4-5. No acute fracture traumatic subluxation is present. The soft tissues of the neck are unremarkable. The lung apices are clear. IMPRESSION: 1. No acute intracranial  abnormality or significant interval change. 2. Stable atrophy and white matter disease. 3. Stable multilevel spondylosis of the cervical spine without evidence for acute trauma. Electronically Signed   By: Marin Roberts M.D.   On: 08/02/2015 17:37   Ct Cervical Spine Wo Contrast  Result Date: 08/02/2015 CLINICAL DATA:  Fall with trauma to head. Possible loss of consciousness. EXAM: CT HEAD WITHOUT CONTRAST CT CERVICAL SPINE WITHOUT CONTRAST TECHNIQUE: Multidetector CT imaging of the head and cervical spine was performed following the standard protocol without intravenous contrast. Multiplanar CT image reconstructions of the cervical spine were also generated. COMPARISON:  CT head and cervical spine [facility] 07/24/2015.  FINDINGS: CT HEAD FINDINGS Mild atrophy and white matter disease is again noted. No acute infarct, hemorrhage, or mass lesion is present. The ventricles are proportionate to the degree of atrophy. There is no significant extra-axial fluid collection. No significant extracranial soft tissue lesion is present. A left lens replacement is noted. The globes and orbits are otherwise intact. CT CERVICAL SPINE FINDINGS The cervical spine is imaged from the skullbase through T2-3. Grade 1 anterolisthesis at C3-4 is stable. Uncovertebral and facet hypertrophy contribute to multilevel foraminal stenosis. Osseous foraminal narrowing is most severe on the right at C3-4, C4-5, and C5-6. Left-sided disease is most significant at C4-5. No acute fracture traumatic subluxation is present. The soft tissues of the neck are unremarkable. The lung apices are clear. IMPRESSION: 1. No acute intracranial abnormality or significant interval change. 2. Stable atrophy and white matter disease. 3. Stable multilevel spondylosis of the cervical spine without evidence for acute trauma. Electronically Signed   By: Marin Roberts M.D.   On: 08/02/2015 17:37   Ct Hip Left Wo Contrast  Result Date: 08/02/2015 CLINICAL DATA:  82 year old female with fall with left hip pain. EXAM: CT OF THE LEFT HIP WITHOUT CONTRAST TECHNIQUE: Multidetector CT imaging of the left hip was performed according to the standard protocol. Multiplanar CT image reconstructions were also generated. COMPARISON:  Radiograph dated 08/02/2015 FINDINGS: There is no acute fracture or dislocation. The bones are osteopenic. There is moderate osteoarthritic changes of the hips. There is a small intramuscular hematoma involving the left psoas major, and iliacus muscles along the left pelvic sidewall as well as anterior to the femoral head. No drainable fluid collection. There is diffuse stranding of the deep fascia of the left thigh. IMPRESSION: No acute fracture or dislocation.  Small intramuscular hematoma involving the left psoas and iliacus muscles. Electronically Signed   By: Elgie Collard M.D.   On: 08/02/2015 20:11   Dg Hip Unilat With Pelvis 2-3 Views Left  Result Date: 08/02/2015 CLINICAL DATA:  82 year old female with acute left hip pain following fall today. Initial encounter. EXAM: DG HIP (WITH OR WITHOUT PELVIS) 2-3V LEFT COMPARISON:  07/24/2015 radiographs FINDINGS: There is no evidence of acute fracture, subluxation or dislocation. Mild degenerative changes within both hips are again noted. No focal bony lesions are identified. IMPRESSION: No evidence of acute bony abnormality. Electronically Signed   By: Harmon Pier M.D.   On: 08/02/2015 17:02    Assessment & Plan:   Kenlynn was seen today for hypertension and osteoarthritis.  Diagnoses and all orders for this visit:  Essential hypertension, benign- she complains of edema so we'll discontinue the calcium channel blocker, will continue to control her blood pressure with an ARB/thiazide diuretic combination. -     Azilsartan-Chlorthalidone (EDARBYCLOR) 40-12.5 MG TABS; Take 1 tablet by mouth daily.  Senile dementia uncomp,  without behavioral disturbance- I think she would benefit from a palliative care intervention. -     Amb Referral to Palliative Care  Falls frequently -     Amb Referral to Palliative Care  Primary osteoarthritis of both knees- due to her side effects with hydrocodone and tramadol will offer Butrans patch for pain relief. -     Buprenorphine 15 MCG/HR PTWK; Place 1 patch onto the skin once a week.   I have discontinued Ms. Ore's TRIBENZOR, HYDROcodone-acetaminophen, and traMADol. I am also having her start on Azilsartan-Chlorthalidone and Buprenorphine. Additionally, I am having her maintain her b complex vitamins, Vitamin D3, diclofenac sodium, potassium chloride SA, aspirin EC, and atorvastatin.  Meds ordered this encounter  Medications  . Azilsartan-Chlorthalidone  (EDARBYCLOR) 40-12.5 MG TABS    Sig: Take 1 tablet by mouth daily.    Dispense:  90 tablet    Refill:  3  . Buprenorphine 15 MCG/HR PTWK    Sig: Place 1 patch onto the skin once a week.    Dispense:  4 patch    Refill:  5     Follow-up: Return if symptoms worsen or fail to improve.  Sanda Linger, MD

## 2015-08-14 NOTE — Progress Notes (Signed)
Pre visit review using our clinic review tool, if applicable. No additional management support is needed unless otherwise documented below in the visit note. 

## 2015-08-16 ENCOUNTER — Telehealth: Payer: Self-pay

## 2015-08-16 NOTE — Telephone Encounter (Signed)
PA initiated via CoverMyMeds key [id number] - Edarbyclor and [id number] Lavera Guise

## 2015-08-17 NOTE — Telephone Encounter (Signed)
Butrans APPROVED through 08/15/2016

## 2015-08-17 NOTE — Telephone Encounter (Signed)
Has questions about the prescription Azilsartan-Chlorthalidone (EDARBYCLOR) 40-12.5 MG TABS. Order #[id number]. Cant be filled until these questions are answered. Please follow up thanks.

## 2015-08-18 NOTE — Telephone Encounter (Signed)
EDARBYCLOR approved through 08/17/2016

## 2015-08-18 NOTE — Telephone Encounter (Signed)
Clinical answers provided to [employer]. Medical decision will be made and faxed to the office within 72 business hours

## 2015-08-20 ENCOUNTER — Other Ambulatory Visit: Payer: Self-pay | Admitting: Internal Medicine

## 2015-08-20 DIAGNOSIS — I10 Essential (primary) hypertension: Secondary | ICD-10-CM

## 2015-08-20 MED ORDER — TELMISARTAN-HCTZ 40-12.5 MG PO TABS: 1.0000 | ORAL_TABLET | Freq: Every day | ORAL | 1 refills | Status: DC

## 2015-08-22 ENCOUNTER — Telehealth: Payer: Self-pay | Admitting: Internal Medicine

## 2015-08-22 ENCOUNTER — Other Ambulatory Visit: Payer: Self-pay | Admitting: *Deleted

## 2015-08-22 NOTE — Telephone Encounter (Signed)
Pt called in and would like to go back to tramadol for pain and wants to know if Dr Yetta Barre can call that in for her?    Pharmacy - [facility] that we have on file

## 2015-08-22 NOTE — Telephone Encounter (Signed)
Tried calling pt again still no answer x's 10 rings.../lmb 

## 2015-08-22 NOTE — Telephone Encounter (Signed)
Tried calling pt no answer x's 10 rings.../lmb 

## 2015-08-22 NOTE — Telephone Encounter (Signed)
She artery has a prescription for this at her pharmacy

## 2015-08-23 NOTE — Telephone Encounter (Signed)
Tried calling pt again 3 separate attempts no answer x's 10 rings can't leave msg no vm. Closing encounter...Raechel Chute

## 2015-08-25 ENCOUNTER — Telehealth: Payer: Self-pay | Admitting: Emergency Medicine

## 2015-08-25 MED ORDER — TRAMADOL HCL 50 MG PO TABS: 50.0000 mg | ORAL_TABLET | Freq: Four times a day (QID) | ORAL | 1 refills | Status: DC | PRN

## 2015-08-25 NOTE — Telephone Encounter (Signed)
Done hardcopy to Corinne  

## 2015-08-25 NOTE — Telephone Encounter (Signed)
Pts daughter called and she has 2 different blood pressure meds. Patient doesn't know which one to take. Also she never received her pain medication traMADol (ULTRAM) 50 MG tablet. Please follow up thanks.

## 2015-08-25 NOTE — Telephone Encounter (Signed)
Called daughter (suzanne) no answer LMOM per chart mom suppose to be taking Micardis 40/12.5 mg 1 pill a day, and as for the tramdol per Dr. Yetta Barre she should have some she was given rx on 8/1 when she was at [facility] care. Did leave msg to call back to confirm if she understood msg.....Raechel Chute

## 2015-08-25 NOTE — Telephone Encounter (Signed)
Rx forwarded to triage

## 2015-08-25 NOTE — Telephone Encounter (Signed)
Daughter called back she states mom does not have a rx for Tramadol. [facility] did not give her a prescription. Mom can't use the pain patch so she is needing the tramadol to be filled. Inform her MD is out of the office today, but will forward to another MD to get approval. Is this ok to refill...Raechel Chute

## 2015-08-25 NOTE — Telephone Encounter (Signed)
Notified pt rx was called into [facility] spoke w/pharmacist Tammy Sours...Virginia Edwards

## 2015-09-12 DIAGNOSIS — H2511 Age-related nuclear cataract, right eye: Secondary | ICD-10-CM | POA: Diagnosis not present

## 2015-09-14 DIAGNOSIS — H2511 Age-related nuclear cataract, right eye: Secondary | ICD-10-CM | POA: Diagnosis not present

## 2015-10-03 ENCOUNTER — Other Ambulatory Visit: Payer: Self-pay | Admitting: Internal Medicine

## 2015-10-19 ENCOUNTER — Telehealth: Payer: Self-pay

## 2015-10-19 NOTE — Telephone Encounter (Signed)
Home Health Cert/Plan of Care received (07/30/2015 - 09/27/2015) and placed on MD's desk for signature  

## 2015-10-19 NOTE — Telephone Encounter (Signed)
Paperwork signed, faxed, copy sent to scan 

## 2015-11-14 DIAGNOSIS — I10 Essential (primary) hypertension: Secondary | ICD-10-CM | POA: Diagnosis not present

## 2015-11-14 DIAGNOSIS — I2510 Atherosclerotic heart disease of native coronary artery without angina pectoris: Secondary | ICD-10-CM | POA: Diagnosis not present

## 2015-11-14 DIAGNOSIS — R296 Repeated falls: Secondary | ICD-10-CM | POA: Diagnosis not present

## 2015-11-14 DIAGNOSIS — M810 Age-related osteoporosis without current pathological fracture: Secondary | ICD-10-CM | POA: Diagnosis not present

## 2015-11-29 NOTE — Progress Notes (Deleted)
HPI: FU CAD. Patient previously had a Myoview preop prior to knee surgery that showed a subtle reversible defect in the apical anterior wall superimposed on a small fixed defect in the anteroseptal wall of the mid ventricle and apex. This was suggestive of prior infarct and very mild ischemia in the anterior and apex. There is also felt to be a prior infarct in the inferior lateral wall. Ejection fraction 65%. Echocardiogram in September of 2013 showed normal LV function, mild to moderate left ventricular hypertrophy, mild left atrial enlargement and mild mitral regurgitation. Patient then had cardiac cath in Sept 2013. This revealed a 20% left main. The LAD has a proximal 50% lesion and there is diffuse 70% stenosis in the first diagonal. The second diagonal had an 80% proximal lesion. The mid LAD had an 80% lesion. The septals were large and supplied collaterals to the right coronary artery. The circumflex was severely diseased with a proximal diffuse 75% lesion and a totally occluded after the first marginal. The second marginal was totally occluded and filled via left to left collaterals. The right coronary was totally occluded. Ejection fraction was normal. PCI was not an option. We discussed CABG versus medical therapy and patient elected medical therapy. Since I last saw her,   Current Outpatient Prescriptions  Medication Sig Dispense Refill  . aspirin EC 81 MG tablet Take 81 mg by mouth daily.    Marland Kitchen. atorvastatin (LIPITOR) 40 MG tablet Take 40 mg by mouth daily at 6 PM.    . b complex vitamins tablet Take 1 tablet by mouth daily.     . Buprenorphine 15 MCG/HR PTWK Place 1 patch onto the skin once a week. 4 patch 5  . Cholecalciferol (VITAMIN D3) 1000 UNITS CAPS Take 1,000 Units by mouth daily.     . diclofenac sodium (VOLTAREN) 1 % GEL APPLY EXTERNALLY TO THE AFFECTED AREA FOUR TIMES DAILY 100 g 11  . potassium chloride SA (K-DUR,KLOR-CON) 20 MEQ tablet TAKE 1 TABLET BY MOUTH TWICE DAILY 60  tablet 3  . telmisartan-hydrochlorothiazide (MICARDIS HCT) 40-12.5 MG tablet Take 1 tablet by mouth daily. 90 tablet 1  . traMADol (ULTRAM) 50 MG tablet Take 1 tablet (50 mg total) by mouth every 6 (six) hours as needed. 60 tablet 1   No current facility-administered medications for this visit.      Past Medical History:  Diagnosis Date  . Arthritis   . CAD (coronary artery disease)   . Cataracts, bilateral   . Chronic back pain   . Eczema   . Hyperlipidemia   . Hypertension    primary Dr. Sanda Lingerhomas Jones [phone], saw Dr. Jens Somrenshaw x 1 cardio  . Osteoporosis   . Sciatica     Past Surgical History:  Procedure Laterality Date  . ABDOMINAL HYSTERECTOMY    . FRACTURE SURGERY     shoulder surgery 2008  . LIPOMA EXCISION     2011  . TOTAL KNEE ARTHROPLASTY  09/27/2011   Procedure: TOTAL KNEE ARTHROPLASTY;  Surgeon: Thera FlakeW D Caffrey Jr., MD;  Location: [facility] OR;  Service: Orthopedics;  Laterality: Right;  right total knee arthroplasty    Social History   Social History  . Marital status: Widowed    Spouse name: N/A  . Number of children: N/A  . Years of education: N/A   Occupational History  . Not on file.   Social History Main Topics  . Smoking status: Never Smoker  . Smokeless tobacco: Never Used  .  Alcohol use No  . Drug use: No  . Sexual activity: Not Currently   Other Topics Concern  . Not on file   Social History Narrative   Caffine drinks-Yes   Seat belt use-Yes   Regular exercise-Yes   Smoke alarm in home-Yes   Guns/firearms in home-NO   History of physical abuse-NO    Family History  Problem Relation Age of Onset  . Lung cancer Father   . Heart Problems Brother   . Heart attack Brother   . Hypertension Sister     ROS: no fevers or chills, productive cough, hemoptysis, dysphasia, odynophagia, melena, hematochezia, dysuria, hematuria, rash, seizure activity, orthopnea, PND, pedal edema, claudication. Remaining systems are negative.  Physical  Exam: Well-developed well-nourished in no acute distress.  Skin is warm and dry.  HEENT is normal.  Neck is supple.  Chest is clear to auscultation with normal expansion.  Cardiovascular exam is regular rate and rhythm.  Abdominal exam nontender or distended. No masses palpated. Extremities show no edema. neuro grossly intact  ECG

## 2015-11-30 ENCOUNTER — Other Ambulatory Visit: Payer: Self-pay | Admitting: Internal Medicine

## 2015-11-30 NOTE — Telephone Encounter (Signed)
Faxed rx to [facility] 

## 2015-12-04 ENCOUNTER — Encounter: Payer: Self-pay | Admitting: *Deleted

## 2015-12-04 ENCOUNTER — Ambulatory Visit: Payer: Medicare Other | Admitting: Cardiology

## 2016-01-11 ENCOUNTER — Telehealth: Payer: Self-pay | Admitting: Internal Medicine

## 2016-01-11 NOTE — Telephone Encounter (Signed)
Attempted to call patient to scheduled annual wellness visit. Pt's phone was busy. Will try to call patient back at a later time.

## 2016-01-30 DIAGNOSIS — Z23 Encounter for immunization: Secondary | ICD-10-CM | POA: Diagnosis not present

## 2016-04-15 ENCOUNTER — Telehealth: Payer: Self-pay | Admitting: Internal Medicine

## 2016-04-15 NOTE — Telephone Encounter (Signed)
Called patient to schedule awv. Patient did not answer. Will attempt to call patient at a later time.  °

## 2016-05-14 ENCOUNTER — Other Ambulatory Visit (INDEPENDENT_AMBULATORY_CARE_PROVIDER_SITE_OTHER): Payer: Medicare Other

## 2016-05-14 ENCOUNTER — Encounter: Payer: Self-pay | Admitting: Internal Medicine

## 2016-05-14 ENCOUNTER — Ambulatory Visit (INDEPENDENT_AMBULATORY_CARE_PROVIDER_SITE_OTHER): Payer: Medicare Other | Admitting: Internal Medicine

## 2016-05-14 VITALS — BP 132/88 | HR 79 | Temp 98.8°F | Ht 62.0 in | Wt 148.5 lb

## 2016-05-14 DIAGNOSIS — D539 Nutritional anemia, unspecified: Secondary | ICD-10-CM

## 2016-05-14 DIAGNOSIS — I2510 Atherosclerotic heart disease of native coronary artery without angina pectoris: Secondary | ICD-10-CM | POA: Diagnosis not present

## 2016-05-14 DIAGNOSIS — E785 Hyperlipidemia, unspecified: Secondary | ICD-10-CM | POA: Diagnosis not present

## 2016-05-14 DIAGNOSIS — I10 Essential (primary) hypertension: Secondary | ICD-10-CM

## 2016-05-14 DIAGNOSIS — N3281 Overactive bladder: Secondary | ICD-10-CM

## 2016-05-14 DIAGNOSIS — M839 Adult osteomalacia, unspecified: Secondary | ICD-10-CM | POA: Diagnosis not present

## 2016-05-14 LAB — CBC WITH DIFFERENTIAL/PLATELET
BASOS PCT: 0.6 % (ref 0.0–3.0)
Basophils Absolute: 0.1 10*3/uL (ref 0.0–0.1)
Eosinophils Absolute: 0.1 10*3/uL (ref 0.0–0.7)
Eosinophils Relative: 1.8 % (ref 0.0–5.0)
HCT: 39.3 % (ref 36.0–46.0)
Hemoglobin: 12.8 g/dL (ref 12.0–15.0)
LYMPHS ABS: 1.9 10*3/uL (ref 0.7–4.0)
LYMPHS PCT: 22.2 % (ref 12.0–46.0)
MCHC: 32.7 g/dL (ref 30.0–36.0)
MCV: 92.9 fl (ref 78.0–100.0)
MONO ABS: 0.6 10*3/uL (ref 0.1–1.0)
Monocytes Relative: 7.5 % (ref 3.0–12.0)
NEUTROS PCT: 67.9 % (ref 43.0–77.0)
Neutro Abs: 5.7 10*3/uL (ref 1.4–7.7)
Platelets: 174 10*3/uL (ref 150.0–400.0)
RBC: 4.23 Mil/uL (ref 3.87–5.11)
RDW: 13.4 % (ref 11.5–15.5)
WBC: 8.4 10*3/uL (ref 4.0–10.5)

## 2016-05-14 LAB — COMPREHENSIVE METABOLIC PANEL
ALBUMIN: 3.9 g/dL (ref 3.5–5.2)
ALK PHOS: 70 U/L (ref 39–117)
ALT: 20 U/L (ref 0–35)
AST: 24 U/L (ref 0–37)
BILIRUBIN TOTAL: 0.6 mg/dL (ref 0.2–1.2)
BUN: 31 mg/dL — ABNORMAL HIGH (ref 6–23)
CALCIUM: 9.9 mg/dL (ref 8.4–10.5)
CO2: 28 mEq/L (ref 19–32)
CREATININE: 0.92 mg/dL (ref 0.40–1.20)
Chloride: 106 mEq/L (ref 96–112)
GFR: 61.12 mL/min (ref >60.00)
Glucose, Bld: 93 mg/dL (ref 70–99)
Potassium: 4 mEq/L (ref 3.5–5.1)
Sodium: 140 mEq/L (ref 135–145)
TOTAL PROTEIN: 6.7 g/dL (ref 6.0–8.3)

## 2016-05-14 LAB — FERRITIN: FERRITIN: 34.5 ng/mL (ref 10.0–291.0)

## 2016-05-14 LAB — IBC PANEL
Iron: 87 ug/dL (ref 42–145)
SATURATION RATIOS: 20.5 % (ref 20.0–50.0)
Transferrin: 303 mg/dL (ref 212.0–360.0)

## 2016-05-14 LAB — LIPID PANEL
CHOL/HDL RATIO: 5
CHOLESTEROL: 233 mg/dL — AB (ref 0–200)
HDL: 49.8 mg/dL (ref >39.00)
NonHDL: 183.65
TRIGLYCERIDES: 207 mg/dL — AB (ref 0.0–149.0)
VLDL: 41.4 mg/dL — ABNORMAL HIGH (ref 0.0–40.0)

## 2016-05-14 LAB — RETICULOCYTES
ABS RETIC: 34160 {cells}/uL (ref 20000–80000)
RBC.: 4.27 MIL/uL (ref 3.80–5.10)
Retic Ct Pct: 0.8 %

## 2016-05-14 LAB — VITAMIN B12: VITAMIN B 12: 436 pg/mL (ref 211–911)

## 2016-05-14 LAB — THYROID PANEL WITH TSH
FREE THYROXINE INDEX: 2 (ref 1.4–3.8)
T3 UPTAKE: 27 % (ref 22–35)
T4, Total: 7.5 ug/dL (ref 4.5–12.0)
TSH: 5.05 mIU/L — ABNORMAL HIGH

## 2016-05-14 LAB — LDL CHOLESTEROL, DIRECT: Direct LDL: 151 mg/dL

## 2016-05-14 LAB — VITAMIN D 25 HYDROXY (VIT D DEFICIENCY, FRACTURES): VITD: 23.18 ng/mL — ABNORMAL LOW (ref 30.00–100.00)

## 2016-05-14 LAB — FOLATE

## 2016-05-14 MED ORDER — AZILSARTAN MEDOXOMIL 80 MG PO TABS: 1.0000 | ORAL_TABLET | Freq: Every day | ORAL | 3 refills | Status: DC

## 2016-05-14 MED ORDER — ATORVASTATIN CALCIUM 40 MG PO TABS: 40.0000 mg | ORAL_TABLET | Freq: Every day | ORAL | 3 refills | Status: DC

## 2016-05-14 MED ORDER — SOLIFENACIN SUCCINATE 5 MG PO TABS: 5.0000 mg | ORAL_TABLET | Freq: Every day | ORAL | 3 refills | Status: DC

## 2016-05-14 NOTE — Progress Notes (Signed)
Pre visit review using our clinic review tool, if applicable. No additional management support is needed unless otherwise documented below in the visit note. 

## 2016-05-14 NOTE — Progress Notes (Signed)
Subjective:  Patient ID: Virginia Edwards, female    DOB: [birth year]  Age: 83 y.o. MRN: [id number]  CC: Hypertension; Anemia; Hyperlipidemia; and Coronary Artery Disease   HPI Virginia Edwards presents for f/up - She complains of frequent urination and wants to stop taking the water pill. She said she is getting up about 4 times per night. She also complains of weight gain. She doesn't consistently take her statin but when she does take it doesn't cause any symptoms such as muscle or joint aches.  Outpatient Medications Prior to Visit  Medication Sig Dispense Refill  . aspirin EC 81 MG tablet Take 81 mg by mouth daily.    Marland Kitchen b complex vitamins tablet Take 1 tablet by mouth daily.     . Cholecalciferol (VITAMIN D3) 1000 UNITS CAPS Take 1,000 Units by mouth daily.     . potassium chloride SA (K-DUR,KLOR-CON) 20 MEQ tablet TAKE 1 TABLET BY MOUTH TWICE DAILY 180 tablet 1  . traMADol (ULTRAM) 50 MG tablet TAKE 1 TABLET BY MOUTH EVERY 6 HOURS AS NEEDED 60 tablet 5  . atorvastatin (LIPITOR) 40 MG tablet Take 40 mg by mouth daily at 6 PM.    . atorvastatin (LIPITOR) 40 MG tablet TAKE 1 TABLET BY MOUTH DAILY 90 tablet 1  . Buprenorphine 15 MCG/HR PTWK Place 1 patch onto the skin once a week. 4 patch 5  . diclofenac sodium (VOLTAREN) 1 % GEL APPLY EXTERNALLY TO THE AFFECTED AREA FOUR TIMES DAILY 100 g 11  . telmisartan-hydrochlorothiazide (MICARDIS HCT) 40-12.5 MG tablet Take 1 tablet by mouth daily. 90 tablet 1   No facility-administered medications prior to visit.     ROS Review of Systems  Constitutional: Positive for unexpected weight change. Negative for appetite change, chills, diaphoresis and fatigue.  Eyes: Negative.   Respiratory: Negative.  Negative for cough, chest tightness, shortness of breath and wheezing.   Cardiovascular: Negative for chest pain, palpitations and leg swelling.  Gastrointestinal: Negative for abdominal pain, constipation, diarrhea, nausea and vomiting.  Endocrine:  Negative.   Genitourinary: Positive for frequency. Negative for difficulty urinating, dysuria, flank pain, hematuria, pelvic pain and urgency.  Musculoskeletal: Negative.  Negative for back pain and neck pain.  Skin: Negative.   Allergic/Immunologic: Negative.   Neurological: Negative.  Negative for dizziness and weakness.  Hematological: Negative for adenopathy. Does not bruise/bleed easily.  Psychiatric/Behavioral: Negative.     Objective:  BP 132/88 (BP Location: Left Arm, Patient Position: Sitting, Cuff Size: Normal)   Pulse 79   Temp 98.8 F (37.1 C) (Oral)   Ht  (1.575 m)   Wt 148 lb 8 oz (67.4 kg)   SpO2 98%   BMI 27.16 kg/m   BP Readings from Last 3 Encounters:  05/14/16 132/88  08/14/15 118/62  08/02/15 125/76    Wt Readings from Last 3 Encounters:  05/14/16 148 lb 8 oz (67.4 kg)  08/14/15 131 lb (59.4 kg)  07/24/15 130 lb (59 kg)    Physical Exam  Constitutional: She is oriented to person, place, and time. No distress.  HENT:  Mouth/Throat: No oropharyngeal exudate.  Eyes: Conjunctivae are normal. Right eye exhibits no discharge. Left eye exhibits no discharge. No scleral icterus.  Neck: Normal range of motion. Neck supple. No JVD present. No tracheal deviation present. No thyromegaly present.  Cardiovascular: Normal rate, regular rhythm, normal heart sounds and intact distal pulses.  Exam reveals no gallop and no friction rub.   No murmur heard. Pulmonary/Chest: Effort normal and  breath sounds normal. No stridor. No respiratory distress. She has no wheezes. She has no rales. She exhibits no tenderness.  Abdominal: Soft. Bowel sounds are normal. She exhibits no distension and no mass. There is no tenderness. There is no rebound and no guarding.  Musculoskeletal: Normal range of motion. She exhibits no edema, tenderness or deformity.  Lymphadenopathy:    She has no cervical adenopathy.  Neurological: She is oriented to person, place, and time.  Skin: Skin  is warm and dry. No rash noted. She is not diaphoretic. No erythema. No pallor.  Vitals reviewed.   Lab Results  Component Value Date   WBC 8.4 05/14/2016   HGB 12.8 05/14/2016   HCT 39.3 05/14/2016   PLT 174.0 05/14/2016   GLUCOSE 93 05/14/2016   CHOL 233 (H) 05/14/2016   TRIG 207.0 (H) 05/14/2016   HDL 49.80 05/14/2016   LDLDIRECT 151.0 05/14/2016   LDLCALC 119 (H) 09/22/2014   ALT 20 05/14/2016   AST 24 05/14/2016   NA 140 05/14/2016   K 4.0 05/14/2016   CL 106 05/14/2016   CREATININE 0.92 05/14/2016   BUN 31 (H) 05/14/2016   CO2 28 05/14/2016   TSH 5.05 (H) 05/14/2016   INR 0.94 10/02/2011   HGBA1C 5.8 09/22/2014    Ct Head Wo Contrast  Result Date: 08/02/2015 CLINICAL DATA:  Fall with trauma to head. Possible loss of consciousness. EXAM: CT HEAD WITHOUT CONTRAST CT CERVICAL SPINE WITHOUT CONTRAST TECHNIQUE: Multidetector CT imaging of the head and cervical spine was performed following the standard protocol without intravenous contrast. Multiplanar CT image reconstructions of the cervical spine were also generated. COMPARISON:  CT head and cervical spine [facility] 07/24/2015. FINDINGS: CT HEAD FINDINGS Mild atrophy and white matter disease is again noted. No acute infarct, hemorrhage, or mass lesion is present. The ventricles are proportionate to the degree of atrophy. There is no significant extra-axial fluid collection. No significant extracranial soft tissue lesion is present. A left lens replacement is noted. The globes and orbits are otherwise intact. CT CERVICAL SPINE FINDINGS The cervical spine is imaged from the skullbase through T2-3. Grade 1 anterolisthesis at C3-4 is stable. Uncovertebral and facet hypertrophy contribute to multilevel foraminal stenosis. Osseous foraminal narrowing is most severe on the right at C3-4, C4-5, and C5-6. Left-sided disease is most significant at C4-5. No acute fracture traumatic subluxation is present. The soft tissues of  the neck are unremarkable. The lung apices are clear. IMPRESSION: 1. No acute intracranial abnormality or significant interval change. 2. Stable atrophy and white matter disease. 3. Stable multilevel spondylosis of the cervical spine without evidence for acute trauma. Electronically Signed   By: Marin Roberts M.D.   On: 08/02/2015 17:37   Ct Cervical Spine Wo Contrast  Result Date: 08/02/2015 CLINICAL DATA:  Fall with trauma to head. Possible loss of consciousness. EXAM: CT HEAD WITHOUT CONTRAST CT CERVICAL SPINE WITHOUT CONTRAST TECHNIQUE: Multidetector CT imaging of the head and cervical spine was performed following the standard protocol without intravenous contrast. Multiplanar CT image reconstructions of the cervical spine were also generated. COMPARISON:  CT head and cervical spine [facility] 07/24/2015. FINDINGS: CT HEAD FINDINGS Mild atrophy and white matter disease is again noted. No acute infarct, hemorrhage, or mass lesion is present. The ventricles are proportionate to the degree of atrophy. There is no significant extra-axial fluid collection. No significant extracranial soft tissue lesion is present. A left lens replacement is noted. The globes and orbits are  otherwise intact. CT CERVICAL SPINE FINDINGS The cervical spine is imaged from the skullbase through T2-3. Grade 1 anterolisthesis at C3-4 is stable. Uncovertebral and facet hypertrophy contribute to multilevel foraminal stenosis. Osseous foraminal narrowing is most severe on the right at C3-4, C4-5, and C5-6. Left-sided disease is most significant at C4-5. No acute fracture traumatic subluxation is present. The soft tissues of the neck are unremarkable. The lung apices are clear. IMPRESSION: 1. No acute intracranial abnormality or significant interval change. 2. Stable atrophy and white matter disease. 3. Stable multilevel spondylosis of the cervical spine without evidence for acute trauma. Electronically Signed   By:  Marin Roberts M.D.   On: 08/02/2015 17:37   Ct Hip Left Wo Contrast  Result Date: 08/02/2015 CLINICAL DATA:  83 year old female with fall with left hip pain. EXAM: CT OF THE LEFT HIP WITHOUT CONTRAST TECHNIQUE: Multidetector CT imaging of the left hip was performed according to the standard protocol. Multiplanar CT image reconstructions were also generated. COMPARISON:  Radiograph dated 08/02/2015 FINDINGS: There is no acute fracture or dislocation. The bones are osteopenic. There is moderate osteoarthritic changes of the hips. There is a small intramuscular hematoma involving the left psoas major, and iliacus muscles along the left pelvic sidewall as well as anterior to the femoral head. No drainable fluid collection. There is diffuse stranding of the deep fascia of the left thigh. IMPRESSION: No acute fracture or dislocation. Small intramuscular hematoma involving the left psoas and iliacus muscles. Electronically Signed   By: Elgie Collard M.D.   On: 08/02/2015 20:11   Dg Hip Unilat With Pelvis 2-3 Views Left  Result Date: 08/02/2015 CLINICAL DATA:  83 year old female with acute left hip pain following fall today. Initial encounter. EXAM: DG HIP (WITH OR WITHOUT PELVIS) 2-3V LEFT COMPARISON:  07/24/2015 radiographs FINDINGS: There is no evidence of acute fracture, subluxation or dislocation. Mild degenerative changes within both hips are again noted. No focal bony lesions are identified. IMPRESSION: No evidence of acute bony abnormality. Electronically Signed   By: Harmon Pier M.D.   On: 08/02/2015 17:02    Assessment & Plan:   Virginia Edwards was seen today for hypertension, anemia, hyperlipidemia and coronary artery disease.  Diagnoses and all orders for this visit:  Deficiency anemia- her H&H are normal now, all of her vitamin levels are normal. We'll continue to follow this. -     CBC with Differential/Platelet; Future -     Vitamin B12; Future -     IBC panel; Future -     Folate;  Future -     Ferritin; Future -     Reticulocytes; Future -     Vitamin B1; Future  Coronary artery disease involving native coronary artery of native heart without angina pectoris- she's had no recent episodes of chest pain, will continue risk factor modification with statins and blood pressure control -     Lipid panel; Future -     atorvastatin (LIPITOR) 40 MG tablet; Take 1 tablet (40 mg total) by mouth daily at 6 PM. -     Azilsartan Medoxomil (EDARBI) 80 MG TABS; Take 1 tablet (80 mg total) by mouth daily.  Essential hypertension, benign- her blood pressure is adequately well controlled, will discontinue the thiazide diuretic at her request, will try to control the blood pressure with an ARB. -     Comprehensive metabolic panel; Future -     CBC with Differential/Platelet; Future -     Thyroid Panel With TSH; Future -  Azilsartan Medoxomil (EDARBI) 80 MG TABS; Take 1 tablet (80 mg total) by mouth daily.  Vitamin D deficient osteomalacia- her vitamin D level is still slightly low, I have asked her to restart vitamin D replacement therapy. -     VITAMIN D 25 Hydroxy (Vit-D Deficiency, Fractures); Future  Hyperlipidemia with target LDL less than 100- her LDL is too high, I have asked her to be more compliant with the statin -     Lipid panel; Future -     Thyroid Panel With TSH; Future -     atorvastatin (LIPITOR) 40 MG tablet; Take 1 tablet (40 mg total) by mouth daily at 6 PM.  OAB (overactive bladder)- she will try Vesicare and will discontinue the thiazide diuretic. -     solifenacin (VESICARE) 5 MG tablet; Take 1 tablet (5 mg total) by mouth daily.   I have discontinued Ms. Neria's Buprenorphine, telmisartan-hydrochlorothiazide, diclofenac sodium, and atorvastatin. I have also changed her atorvastatin. Additionally, I am having her start on Azilsartan Medoxomil and solifenacin. Lastly, I am having her maintain her b complex vitamins, Vitamin D3, aspirin EC, potassium chloride  SA, and traMADol.  Meds ordered this encounter  Medications  . atorvastatin (LIPITOR) 40 MG tablet    Sig: Take 1 tablet (40 mg total) by mouth daily at 6 PM.    Dispense:  90 tablet    Refill:  3  . Azilsartan Medoxomil (EDARBI) 80 MG TABS    Sig: Take 1 tablet (80 mg total) by mouth daily.    Dispense:  90 tablet    Refill:  3  . solifenacin (VESICARE) 5 MG tablet    Sig: Take 1 tablet (5 mg total) by mouth daily.    Dispense:  90 tablet    Refill:  3     Follow-up: Return in about 3 months (around 08/13/2016).  Sanda Linger, MD

## 2016-05-14 NOTE — Patient Instructions (Signed)

## 2016-05-15 MED ORDER — VITAMIN D3 25 MCG (1000 UT) PO CAPS: 2000.0000 [IU] | ORAL_CAPSULE | Freq: Every day | ORAL | 3 refills | Status: DC

## 2016-05-16 ENCOUNTER — Telehealth: Payer: Self-pay

## 2016-05-16 ENCOUNTER — Telehealth: Payer: Self-pay | Admitting: Internal Medicine

## 2016-05-16 NOTE — Telephone Encounter (Signed)
PA started via covermymeds.   Key: [id number]

## 2016-05-16 NOTE — Telephone Encounter (Signed)
Reference Number [id number]  Has question for PA for Azilsartan Medoxomil (EDARBI) 80 MG TABS   Has Pt tried alternative to medication....  Candesartan Cilexetil Irbesartan  Losartan patassium Olmesartan Telmisartan Valasartan

## 2016-05-17 LAB — VITAMIN B1: Vitamin B1 (Thiamine): 15 nmol/L (ref 8–30)

## 2016-05-17 NOTE — Telephone Encounter (Signed)
[facility] and gave clinic information requested for coverage determination.

## 2016-05-20 ENCOUNTER — Other Ambulatory Visit: Payer: Self-pay | Admitting: *Deleted

## 2016-05-20 DIAGNOSIS — I10 Essential (primary) hypertension: Secondary | ICD-10-CM

## 2016-05-20 DIAGNOSIS — I2510 Atherosclerotic heart disease of native coronary artery without angina pectoris: Secondary | ICD-10-CM

## 2016-05-20 MED ORDER — AZILSARTAN MEDOXOMIL 80 MG PO TABS: 1.0000 | ORAL_TABLET | Freq: Every day | ORAL | 1 refills | Status: DC

## 2016-05-20 NOTE — Telephone Encounter (Signed)
Called patient. NO VM set up.

## 2016-05-20 NOTE — Telephone Encounter (Signed)
PA was approved. Will you call pt and let her know. She (or caretaker) will need to call pharmacy for refills.

## 2016-06-20 ENCOUNTER — Other Ambulatory Visit: Payer: Self-pay | Admitting: Internal Medicine

## 2016-07-28 ENCOUNTER — Other Ambulatory Visit: Payer: Self-pay | Admitting: Internal Medicine

## 2016-09-19 ENCOUNTER — Other Ambulatory Visit (INDEPENDENT_AMBULATORY_CARE_PROVIDER_SITE_OTHER): Payer: Medicare Other

## 2016-09-19 ENCOUNTER — Ambulatory Visit (INDEPENDENT_AMBULATORY_CARE_PROVIDER_SITE_OTHER): Payer: Medicare Other | Admitting: Internal Medicine

## 2016-09-19 ENCOUNTER — Encounter: Payer: Self-pay | Admitting: Internal Medicine

## 2016-09-19 VITALS — BP 142/88 | HR 78 | Temp 98.2°F | Resp 16 | Wt 145.0 lb

## 2016-09-19 DIAGNOSIS — I10 Essential (primary) hypertension: Secondary | ICD-10-CM

## 2016-09-19 DIAGNOSIS — I2510 Atherosclerotic heart disease of native coronary artery without angina pectoris: Secondary | ICD-10-CM | POA: Diagnosis not present

## 2016-09-19 LAB — BASIC METABOLIC PANEL
BUN: 24 mg/dL — ABNORMAL HIGH (ref 6–23)
CALCIUM: 9.9 mg/dL (ref 8.4–10.5)
CO2: 28 meq/L (ref 19–32)
CREATININE: 0.82 mg/dL (ref 0.40–1.20)
Chloride: 107 mEq/L (ref 96–112)
GFR: 69.74 mL/min (ref >60.00)
Glucose, Bld: 97 mg/dL (ref 70–99)
Potassium: 4 mEq/L (ref 3.5–5.1)
SODIUM: 142 meq/L (ref 135–145)

## 2016-09-19 NOTE — Progress Notes (Signed)
Subjective:  Patient ID: Virginia Edwards, female    DOB: 1925/08/05  Age: 83 y.o. MRN: [id number]  CC: Hypertension   HPI Virginia Edwards presents for a BP check - she offers no complaints and tells me that her BP has been well controlled.  Outpatient Medications Prior to Visit  Medication Sig Dispense Refill  . aspirin EC 81 MG tablet Take 81 mg by mouth daily.    Marland Kitchen atorvastatin (LIPITOR) 40 MG tablet Take 1 tablet (40 mg total) by mouth daily at 6 PM. 90 tablet 3  . Azilsartan Medoxomil (EDARBI) 80 MG TABS Take 1 tablet (80 mg total) by mouth daily. 90 tablet 1  . b complex vitamins tablet Take 1 tablet by mouth daily.     . Cholecalciferol (VITAMIN D3) 1000 units CAPS Take 2 capsules (2,000 Units total) by mouth daily. 180 capsule 3  . potassium chloride SA (K-DUR,KLOR-CON) 20 MEQ tablet TAKE 1 TABLET BY MOUTH TWICE DAILY 180 tablet 1  . solifenacin (VESICARE) 5 MG tablet Take 1 tablet (5 mg total) by mouth daily. 90 tablet 3  . traMADol (ULTRAM) 50 MG tablet TAKE 1 TABLET BY MOUTH EVERY 6 HOURS AS NEEDED 60 tablet 5   No facility-administered medications prior to visit.     ROS Review of Systems  Constitutional: Negative for appetite change, diaphoresis, fatigue and unexpected weight change.  HENT: Negative.   Eyes: Negative for visual disturbance.  Respiratory: Negative for cough, chest tightness, shortness of breath and wheezing.   Cardiovascular: Negative for chest pain, palpitations and leg swelling.  Gastrointestinal: Negative for abdominal pain, constipation, diarrhea, nausea and vomiting.  Endocrine: Negative.   Genitourinary: Negative.  Negative for difficulty urinating.  Musculoskeletal: Negative.  Negative for back pain and myalgias.  Skin: Negative.  Negative for color change and rash.  Allergic/Immunologic: Negative.   Neurological: Negative.  Negative for dizziness.  Hematological: Negative for adenopathy. Does not bruise/bleed easily.    Objective:  BP (!) 142/88    Pulse 78   Temp 98.2 F (36.8 C) (Oral)   Resp 16   Wt 145 lb (65.8 kg)   SpO2 97%   BMI 26.52 kg/m   BP Readings from Last 3 Encounters:  09/19/16 (!) 142/88  05/14/16 132/88  08/14/15 118/62    Wt Readings from Last 3 Encounters:  09/19/16 145 lb (65.8 kg)  05/14/16 148 lb 8 oz (67.4 kg)  08/14/15 131 lb (59.4 kg)    Physical Exam  Constitutional: She is oriented to person, place, and time. No distress.  HENT:  Mouth/Throat: Oropharynx is clear and moist. No oropharyngeal exudate.  Eyes: Conjunctivae are normal. Right eye exhibits no discharge. Left eye exhibits no discharge. No scleral icterus.  Neck: Normal range of motion. Neck supple. No JVD present. No thyromegaly present.  Cardiovascular: Normal rate, regular rhythm and intact distal pulses.  Exam reveals no gallop and no friction rub.   No murmur heard. Pulmonary/Chest: Effort normal and breath sounds normal. No respiratory distress. She has no wheezes. She has no rales. She exhibits no tenderness.  Abdominal: Soft. Bowel sounds are normal. She exhibits no distension and no mass. There is no tenderness. There is no rebound and no guarding.  Musculoskeletal: Normal range of motion. She exhibits no edema, tenderness or deformity.  Lymphadenopathy:    She has no cervical adenopathy.  Neurological: She is alert and oriented to person, place, and time.  Skin: Skin is warm and dry. No rash noted. She is not  diaphoretic. No erythema. No pallor.  Vitals reviewed.   Lab Results  Component Value Date   WBC 8.4 05/14/2016   HGB 12.8 05/14/2016   HCT 39.3 05/14/2016   PLT 174.0 05/14/2016   GLUCOSE 97 09/19/2016   CHOL 233 (H) 05/14/2016   TRIG 207.0 (H) 05/14/2016   HDL 49.80 05/14/2016   LDLDIRECT 151.0 05/14/2016   LDLCALC 119 (H) 09/22/2014   ALT 20 05/14/2016   AST 24 05/14/2016   NA 142 09/19/2016   K 4.0 09/19/2016   CL 107 09/19/2016   CREATININE 0.82 09/19/2016   BUN 24 (H) 09/19/2016   CO2 28  09/19/2016   TSH 5.05 (H) 05/14/2016   INR 0.94 10/02/2011   HGBA1C 5.8 09/22/2014    Ct Head Wo Contrast  Result Date: 08/02/2015 CLINICAL DATA:  Fall with trauma to head. Possible loss of consciousness. EXAM: CT HEAD WITHOUT CONTRAST CT CERVICAL SPINE WITHOUT CONTRAST TECHNIQUE: Multidetector CT imaging of the head and cervical spine was performed following the standard protocol without intravenous contrast. Multiplanar CT image reconstructions of the cervical spine were also generated. COMPARISON:  [facility] head and cervical spine [facility] 07/24/2015. FINDINGS: CT HEAD FINDINGS Mild atrophy and white matter disease is again noted. No acute infarct, hemorrhage, or mass lesion is present. The ventricles are proportionate to the degree of atrophy. There is no significant extra-axial fluid collection. No significant extracranial soft tissue lesion is present. A left lens replacement is noted. The globes and orbits are otherwise intact. CT CERVICAL SPINE FINDINGS The cervical spine is imaged from the skullbase through T2-3. Grade 1 anterolisthesis at C3-4 is stable. Uncovertebral and facet hypertrophy contribute to multilevel foraminal stenosis. Osseous foraminal narrowing is most severe on the right at C3-4, C4-5, and C5-6. Left-sided disease is most significant at C4-5. No acute fracture traumatic subluxation is present. The soft tissues of the neck are unremarkable. The lung apices are clear. IMPRESSION: 1. No acute intracranial abnormality or significant interval change. 2. Stable atrophy and white matter disease. 3. Stable multilevel spondylosis of the cervical spine without evidence for acute trauma. Electronically Signed   By: Marin Robertshristopher  Mattern M.D.   On: 08/02/2015 17:37   Ct Cervical Spine Wo Contrast  Result Date: 08/02/2015 CLINICAL DATA:  Fall with trauma to head. Possible loss of consciousness. EXAM: CT HEAD WITHOUT CONTRAST CT CERVICAL SPINE WITHOUT CONTRAST TECHNIQUE:  Multidetector CT imaging of the head and cervical spine was performed following the standard protocol without intravenous contrast. Multiplanar CT image reconstructions of the cervical spine were also generated. COMPARISON:  [facility] head and cervical spine [facility] 07/24/2015. FINDINGS: CT HEAD FINDINGS Mild atrophy and white matter disease is again noted. No acute infarct, hemorrhage, or mass lesion is present. The ventricles are proportionate to the degree of atrophy. There is no significant extra-axial fluid collection. No significant extracranial soft tissue lesion is present. A left lens replacement is noted. The globes and orbits are otherwise intact. CT CERVICAL SPINE FINDINGS The cervical spine is imaged from the skullbase through T2-3. Grade 1 anterolisthesis at C3-4 is stable. Uncovertebral and facet hypertrophy contribute to multilevel foraminal stenosis. Osseous foraminal narrowing is most severe on the right at C3-4, C4-5, and C5-6. Left-sided disease is most significant at C4-5. No acute fracture traumatic subluxation is present. The soft tissues of the neck are unremarkable. The lung apices are clear. IMPRESSION: 1. No acute intracranial abnormality or significant interval change. 2. Stable atrophy and white matter disease. 3.  Stable multilevel spondylosis of the cervical spine without evidence for acute trauma. Electronically Signed   By: Marin Roberts M.D.   On: 08/02/2015 17:37   Ct Hip Left Wo Contrast  Result Date: 08/02/2015 CLINICAL DATA:  83 year old female with fall with left hip pain. EXAM: CT OF THE LEFT HIP WITHOUT CONTRAST TECHNIQUE: Multidetector CT imaging of the left hip was performed according to the standard protocol. Multiplanar CT image reconstructions were also generated. COMPARISON:  Radiograph dated 08/02/2015 FINDINGS: There is no acute fracture or dislocation. The bones are osteopenic. There is moderate osteoarthritic changes of the hips. There is a  small intramuscular hematoma involving the left psoas major, and iliacus muscles along the left pelvic sidewall as well as anterior to the femoral head. No drainable fluid collection. There is diffuse stranding of the deep fascia of the left thigh. IMPRESSION: No acute fracture or dislocation. Small intramuscular hematoma involving the left psoas and iliacus muscles. Electronically Signed   By: Elgie Collard M.D.   On: 08/02/2015 20:11   Dg Hip Unilat With Pelvis 2-3 Views Left  Result Date: 08/02/2015 CLINICAL DATA:  83 year old female with acute left hip pain following fall today. Initial encounter. EXAM: DG HIP (WITH OR WITHOUT PELVIS) 2-3V LEFT COMPARISON:  07/24/2015 radiographs FINDINGS: There is no evidence of acute fracture, subluxation or dislocation. Mild degenerative changes within both hips are again noted. No focal bony lesions are identified. IMPRESSION: No evidence of acute bony abnormality. Electronically Signed   By: Harmon Pier M.D.   On: 08/02/2015 17:02    Assessment & Plan:   Virginia Edwards was seen today for hypertension.  Diagnoses and all orders for this visit:  Essential hypertension, benign- her BP is well controlled, lytes and renal fxn are stable -     Basic metabolic panel; Future   I am having Virginia Edwards maintain her b complex vitamins, aspirin EC, atorvastatin, solifenacin, Vitamin D3, Azilsartan Medoxomil, traMADol, and potassium chloride SA.  No orders of the defined types were placed in this encounter.    Follow-up: Return in about 6 months (around 03/20/2017).  Sanda Linger, MD

## 2016-09-19 NOTE — Patient Instructions (Signed)

## 2016-10-21 ENCOUNTER — Telehealth: Payer: Self-pay | Admitting: Internal Medicine

## 2016-10-21 NOTE — Telephone Encounter (Signed)
Copied from CRM #[id number]. Topic: Quick Communication - See Telephone Encounter >> Oct 21, 2016  4:00 PM Lynnell Chad wrote: CRM for notification. See Telephone encounter for:  10/21/16. Called pt to schedule AWV. Pt did not answer. Vm did not pick up could not leave msg. AWV can be scheduled at anytime.

## 2016-10-24 DIAGNOSIS — H04123 Dry eye syndrome of bilateral lacrimal glands: Secondary | ICD-10-CM | POA: Diagnosis not present

## 2017-01-29 ENCOUNTER — Telehealth: Payer: Self-pay | Admitting: Internal Medicine

## 2017-01-29 ENCOUNTER — Other Ambulatory Visit: Payer: Self-pay | Admitting: Internal Medicine

## 2017-01-29 NOTE — Telephone Encounter (Signed)
Copied from CRM [id number]. Topic: Quick Communication - See Telephone Encounter >> Jan 29, 2017  3:45 PM Trula SladeWalter, Linda F wrote: CRM for notification. See Telephone encounter for:  01/29/17. Patient has moved and need a new prescription for her Governor Speckingramadyl to be sent to her preferred pharmacy: [facility] on [address] [place], [place]  [phone].  Also they are waiting an authorization for the medication.

## 2017-02-03 ENCOUNTER — Telehealth: Payer: Self-pay | Admitting: Internal Medicine

## 2017-02-03 NOTE — Telephone Encounter (Signed)
Patients daughter is calling back. She said she has been out for a while now. She would like it sent to the [facility] [address] Needs authorization.

## 2017-02-03 NOTE — Telephone Encounter (Signed)
Copied from CRM [id number]. Topic: Quick Communication - Rx Refill/Question >> Feb 03, 2017  1:15 PM Everardo PacificMoton, Virginia Edwards, [place] wrote: Medication: Tramadol   Has the patient contacted their pharmacy? Yes, Stated that she has called the pharmacy for her mothers medication  and was told she needed to call her mothers doc office to have them change the pharmacy   Preferred Pharmacy (with phone number or street name):[facility] [place] [phone]   Agent: Please be advised that RX refills may take up to 3 business days. We ask that you follow-up with your pharmacy.

## 2017-02-03 NOTE — Telephone Encounter (Signed)
Patient is requesting new pharmacy-( sent in a few days ago)

## 2017-02-03 NOTE — Telephone Encounter (Signed)
Medication refill. Last office visit 09/19/16. Thanks.

## 2017-02-04 NOTE — Telephone Encounter (Signed)
Received information to start PA.

## 2017-02-06 ENCOUNTER — Other Ambulatory Visit: Payer: Self-pay | Admitting: Internal Medicine

## 2017-02-06 DIAGNOSIS — M170 Bilateral primary osteoarthritis of knee: Secondary | ICD-10-CM

## 2017-02-06 MED ORDER — TRAMADOL HCL 50 MG PO TABS: 50.0000 mg | ORAL_TABLET | Freq: Four times a day (QID) | ORAL | 5 refills | Status: DC | PRN

## 2017-02-06 NOTE — Telephone Encounter (Signed)
What is the dx for tramadol?

## 2017-02-07 NOTE — Telephone Encounter (Signed)
lvm for pt dt [name]([place]) to call back.

## 2017-03-17 ENCOUNTER — Telehealth: Payer: Self-pay | Admitting: *Deleted

## 2017-03-17 NOTE — Telephone Encounter (Signed)
Copied from CRM [phone]. Topic: General - Other >> Mar 17, 2017  3:33 PM Gerrianne ScalePayne, Angela L wrote: Reason for CRM: patient calling stating that [facility] [place] need more information about her [employer] for RX Edarbi 80mg  please call pt if you have any questions

## 2017-03-19 NOTE — Telephone Encounter (Signed)
PA was started but it came back that pt does not have coverage. Can you call pt and find out what insurance she has, I need ID, RXBIN, RXPCN and RXGroup if available.

## 2017-03-19 NOTE — Telephone Encounter (Signed)
Key: [id number]

## 2017-03-21 NOTE — Telephone Encounter (Signed)
Patient states she has found a "large bottle" of this medication while moving.  Patient did not want to give me her medication insurance information.  I did inform patient that it does take a few weeks to process.  That she would either need to call us back with the correct info or drop off to our office in a suitable time frame for next refill.

## 2017-05-05 ENCOUNTER — Telehealth: Payer: Self-pay | Admitting: Cardiology

## 2017-05-05 NOTE — Telephone Encounter (Signed)
New Message:    Daughter wants Dr Jens Somrenshaw to know [facility].

## 2017-05-06 NOTE — Telephone Encounter (Signed)
Left message for pt dtr  to call if needed.

## 2017-05-16 MED ORDER — ATORVASTATIN CALCIUM 40 MG PO TABS
40.00 | ORAL_TABLET | ORAL | Status: DC
Start: 2017-05-14 — End: 2017-05-16

## 2017-05-16 MED ORDER — CLOTRIMAZOLE 1 % EX CREA
TOPICAL_CREAM | CUTANEOUS | Status: DC
Start: 2017-05-14 — End: 2017-05-16

## 2017-05-16 MED ORDER — URSODIOL 300 MG PO CAPS
300.00 | ORAL_CAPSULE | ORAL | Status: DC
Start: 2017-05-14 — End: 2017-05-16

## 2017-05-16 MED ORDER — SODIUM CHLORIDE 0.9 % IV SOLN
INTRAVENOUS | Status: DC
Start: ? — End: 2017-05-16

## 2017-05-16 MED ORDER — ENALAPRILAT 1.25 MG/ML IV INJ
1.25 | INJECTION | INTRAVENOUS | Status: DC
Start: ? — End: 2017-05-16

## 2017-05-16 MED ORDER — THIAMINE HCL 100 MG PO TABS
100.00 | ORAL_TABLET | ORAL | Status: DC
Start: 2017-05-14 — End: 2017-05-16

## 2017-05-16 MED ORDER — TRAMADOL HCL 50 MG PO TABS
50.00 | ORAL_TABLET | ORAL | Status: DC
Start: ? — End: 2017-05-16

## 2017-05-16 MED ORDER — HYDRALAZINE HCL 50 MG PO TABS
50.00 | ORAL_TABLET | ORAL | Status: DC
Start: 2017-05-14 — End: 2017-05-16

## 2017-05-16 MED ORDER — POTASSIUM CHLORIDE CRYS ER 20 MEQ PO TBCR
EXTENDED_RELEASE_TABLET | ORAL | Status: DC
Start: 2017-05-14 — End: 2017-05-16

## 2017-05-16 MED ORDER — LOSARTAN POTASSIUM 50 MG PO TABS
50.00 | ORAL_TABLET | ORAL | Status: DC
Start: 2017-05-15 — End: 2017-05-16

## 2017-05-16 MED ORDER — MELATONIN 1 MG PO TABS
2.00 | ORAL_TABLET | ORAL | Status: DC
Start: 2017-05-14 — End: 2017-05-16

## 2017-05-16 MED ORDER — ACETAMINOPHEN 325 MG PO TABS
650.00 | ORAL_TABLET | ORAL | Status: DC
Start: ? — End: 2017-05-16

## 2017-05-16 MED ORDER — GENERIC EXTERNAL MEDICATION
Status: DC
Start: ? — End: 2017-05-16

## 2017-05-20 ENCOUNTER — Telehealth: Payer: Self-pay | Admitting: Internal Medicine

## 2017-05-20 DIAGNOSIS — I10 Essential (primary) hypertension: Secondary | ICD-10-CM

## 2017-05-20 DIAGNOSIS — I2510 Atherosclerotic heart disease of native coronary artery without angina pectoris: Secondary | ICD-10-CM

## 2017-05-20 NOTE — Telephone Encounter (Signed)
Calling in regard to drug interaction between edarbi and potassium.  Is requesting call back within 24 hours with instructions.

## 2017-05-21 NOTE — Telephone Encounter (Signed)
Major drug interaction between [place] and Potassium: increased risk of hyperkalemia.  Please advise if okay to continue rx.

## 2017-05-23 ENCOUNTER — Telehealth: Payer: Self-pay | Admitting: Internal Medicine

## 2017-05-23 NOTE — Telephone Encounter (Signed)
Copied from CRM [id number]. Topic: Quick Communication - See Telephone Encounter >> May 23, 2017 10:17 AM Eston Mould B wrote: CRM for notification. See Telephone encounter for: 05/23/17.  Sandi from [facility] called to let Dr Yetta Barre know the pt declined an apt  to discuss  Gallbladder surgery.  Sandi's contact number is [phone]

## 2017-05-27 ENCOUNTER — Telehealth: Payer: Self-pay | Admitting: Internal Medicine

## 2017-05-27 NOTE — Telephone Encounter (Signed)
Copied from CRM [id number]. Topic: Quick Communication - See Telephone Encounter >> May 27, 2017 11:42 AM Arlyss Gandy, NT wrote: CRM for notification. See Telephone encounter for: 05/27/17. Victorino Dike with [facility] in [place] calling and states that the pt has requested to be discharged from home health as she deems it not necessary. Chestine Spore will be discharging pt today. CB#: [phone]

## 2017-06-09 ENCOUNTER — Other Ambulatory Visit: Payer: Self-pay | Admitting: Internal Medicine

## 2017-06-09 DIAGNOSIS — E785 Hyperlipidemia, unspecified: Secondary | ICD-10-CM

## 2017-06-09 DIAGNOSIS — I2510 Atherosclerotic heart disease of native coronary artery without angina pectoris: Secondary | ICD-10-CM

## 2017-06-12 ENCOUNTER — Other Ambulatory Visit: Payer: Self-pay

## 2017-06-12 ENCOUNTER — Other Ambulatory Visit: Payer: Self-pay | Admitting: Internal Medicine

## 2017-06-12 ENCOUNTER — Telehealth: Payer: Self-pay | Admitting: Internal Medicine

## 2017-06-12 DIAGNOSIS — E785 Hyperlipidemia, unspecified: Secondary | ICD-10-CM

## 2017-06-12 DIAGNOSIS — I2510 Atherosclerotic heart disease of native coronary artery without angina pectoris: Secondary | ICD-10-CM

## 2017-06-12 MED ORDER — ATORVASTATIN CALCIUM 40 MG PO TABS: 40.0000 mg | ORAL_TABLET | Freq: Every day | ORAL | 3 refills | Status: DC

## 2017-06-12 NOTE — Telephone Encounter (Signed)
LOV  09/19/16 Dr. Yetta Barre K-DUR  Last refill 07/28/16  #180 with 1 refill Apresoline is not on medication list.

## 2017-06-12 NOTE — Telephone Encounter (Signed)
Copied from CRM [id number]. Topic: Quick Communication - Rx Refill/Question >> Jun 12, 2017  1:39 PM Jonette Eva wrote: Pt's daughter called to get the meds below filled for the pt  Medication: hydrALAZINE (APRESOLINE) tablet   [[id number]], potassium chloride SA (K-DUR,KLOR-CON) 20 MEQ tablet [[id number]] , atorvastatin (LIPITOR) 40 MG tablet [[id number]]   Has the patient contacted their pharmacy? Yes.   (Agent: If no, request that the patient contact the pharmacy for the refill.) (Agent: If yes, when and what did the pharmacy advise?)  [facility]  Agent: Please be advised that RX refills may take up to 3 business days. We ask that you follow-up with your pharmacy.

## 2017-06-13 MED ORDER — AZILSARTAN MEDOXOMIL 80 MG PO TABS: 1.0000 | ORAL_TABLET | Freq: Every day | ORAL | 0 refills | Status: DC

## 2017-06-18 ENCOUNTER — Telehealth: Payer: Self-pay | Admitting: Internal Medicine

## 2017-06-18 NOTE — Telephone Encounter (Signed)
Copied from CRM [id number]. Topic: Quick Communication - See Telephone Encounter >> Jun 18, 2017 10:50 AM Cipriano Bunker wrote: CRM for notification. See Telephone encounter for: 06/18/17.  Azilsartan Medoxomil (EDARBI) 80 MG TABS  [facility] but Insurance is saying has to be PA.   She is out of medication. Please call daughter, Darl Pikes and let her know what to do.    Pharmacy said they sent to office last week.

## 2017-06-18 NOTE — Telephone Encounter (Signed)
Tried to call pt dtr back. No room to leave a vmm.   I had a sample up front for her to pick up. Did this get picked up.   There is a note from 03/17/2017 - we tried to start a PA for the Edarbi but pt or pt dtr did not want to give [place] the needed information to start the PA. PA will not be started until we have the needed information.   Additionaly, Pt needs to have a hospital follow up visit.

## 2017-06-20 NOTE — Telephone Encounter (Addendum)
Daughter calling back with RX ID # [id number] BX BIN # [id number]. Any questions call back [phone]  Also requesting [facility]ill on [facility] [place]. Will call back to make hospital follow up next week

## 2017-06-23 NOTE — Telephone Encounter (Signed)
Per previous message, they will call back to schedule.

## 2017-06-23 NOTE — Telephone Encounter (Signed)
Tried to call daughter back. Per Stef, patient must be seen at a hospital follow up with Dr Yetta BarreJones before anything can be sent in. There is a contraindication between [place] and Potassium so Dr Yetta BarreJones will need to discuss this with the patient. Please transfer to office to schedule.

## 2017-06-23 NOTE — Telephone Encounter (Signed)
Pt is still in need of a hospital follow up visit.

## 2017-06-23 NOTE — Telephone Encounter (Signed)
Spoke to patient's daughter. Appointment scheduled with Dr Yetta BarreJones on 07/01/17. The patient is completely out of Edarbi and Potassium. She wanted to know if a small supply could be sent in or if she should hold off until she is seen. Please advise.

## 2017-06-24 NOTE — Telephone Encounter (Signed)
1. Can you advise if you approve of sending in the Edarbi and Potassium? Please advise.   I have recommended that pt dtr bring pt in for an appointment as there are several medication questions, etc.   2. Pharmacy states that there is a contraindication between the Edarbi and the Potassium as the two can cause hyperkalemia. Please advise on this as well.

## 2017-06-24 NOTE — Telephone Encounter (Signed)
Called patient's daughter and left message giving MD response. Per [place], patient would have to be seen before anything is called in. So if patient is out of medications we can not refill it.

## 2017-06-24 NOTE — Telephone Encounter (Signed)
pls bring her in for a potassium check

## 2017-07-01 ENCOUNTER — Other Ambulatory Visit (INDEPENDENT_AMBULATORY_CARE_PROVIDER_SITE_OTHER): Payer: Medicare Other

## 2017-07-01 ENCOUNTER — Encounter: Payer: Self-pay | Admitting: Internal Medicine

## 2017-07-01 ENCOUNTER — Ambulatory Visit (INDEPENDENT_AMBULATORY_CARE_PROVIDER_SITE_OTHER): Payer: Medicare Other | Admitting: Internal Medicine

## 2017-07-01 VITALS — BP 200/80 | HR 79 | Temp 98.7°F | Resp 16 | Ht 62.0 in | Wt 145.2 lb

## 2017-07-01 DIAGNOSIS — R739 Hyperglycemia, unspecified: Secondary | ICD-10-CM

## 2017-07-01 DIAGNOSIS — E785 Hyperlipidemia, unspecified: Secondary | ICD-10-CM

## 2017-07-01 DIAGNOSIS — M839 Adult osteomalacia, unspecified: Secondary | ICD-10-CM

## 2017-07-01 DIAGNOSIS — I10 Essential (primary) hypertension: Secondary | ICD-10-CM | POA: Diagnosis not present

## 2017-07-01 DIAGNOSIS — I2510 Atherosclerotic heart disease of native coronary artery without angina pectoris: Secondary | ICD-10-CM

## 2017-07-01 DIAGNOSIS — N3281 Overactive bladder: Secondary | ICD-10-CM | POA: Diagnosis not present

## 2017-07-01 LAB — CBC WITH DIFFERENTIAL/PLATELET
BASOS ABS: 0 10*3/uL (ref 0.0–0.1)
Basophils Relative: 0.4 % (ref 0.0–3.0)
EOS ABS: 0.1 10*3/uL (ref 0.0–0.7)
Eosinophils Relative: 2 % (ref 0.0–5.0)
HEMATOCRIT: 35.6 % — AB (ref 36.0–46.0)
HEMOGLOBIN: 11.5 g/dL — AB (ref 12.0–15.0)
Lymphocytes Relative: 32 % (ref 12.0–46.0)
Lymphs Abs: 2.1 10*3/uL (ref 0.7–4.0)
MCHC: 32.3 g/dL (ref 30.0–36.0)
MCV: 91 fl (ref 78.0–100.0)
MONO ABS: 0.5 10*3/uL (ref 0.1–1.0)
Monocytes Relative: 7.4 % (ref 3.0–12.0)
NEUTROS ABS: 3.8 10*3/uL (ref 1.4–7.7)
Neutrophils Relative %: 58.2 % (ref 43.0–77.0)
PLATELETS: 174 10*3/uL (ref 150.0–400.0)
RBC: 3.91 Mil/uL (ref 3.87–5.11)
RDW: 14.3 % (ref 11.5–15.5)
WBC: 6.5 10*3/uL (ref 4.0–10.5)

## 2017-07-01 LAB — COMPREHENSIVE METABOLIC PANEL
ALBUMIN: 3.8 g/dL (ref 3.5–5.2)
ALT: 14 U/L (ref 0–35)
AST: 21 U/L (ref 0–37)
Alkaline Phosphatase: 78 U/L (ref 39–117)
BUN: 17 mg/dL (ref 6–23)
CALCIUM: 9.9 mg/dL (ref 8.4–10.5)
CHLORIDE: 104 meq/L (ref 96–112)
CO2: 30 mEq/L (ref 19–32)
CREATININE: 0.77 mg/dL (ref 0.40–1.20)
GFR: 74.86 mL/min (ref >60.00)
Glucose, Bld: 95 mg/dL (ref 70–99)
POTASSIUM: 3.8 meq/L (ref 3.5–5.1)
Sodium: 140 mEq/L (ref 135–145)
Total Bilirubin: 0.7 mg/dL (ref 0.2–1.2)
Total Protein: 6.6 g/dL (ref 6.0–8.3)

## 2017-07-01 LAB — LIPID PANEL
CHOL/HDL RATIO: 3
Cholesterol: 158 mg/dL (ref 0–200)
HDL: 50.7 mg/dL (ref >39.00)
LDL CALC: 90 mg/dL (ref 0–99)
NonHDL: 107.04
Triglycerides: 87 mg/dL (ref 0.0–149.0)
VLDL: 17.4 mg/dL (ref 0.0–40.0)

## 2017-07-01 LAB — TSH: TSH: 4.03 u[IU]/mL (ref 0.35–4.50)

## 2017-07-01 LAB — VITAMIN D 25 HYDROXY (VIT D DEFICIENCY, FRACTURES): VITD: 30.86 ng/mL (ref 30.00–100.00)

## 2017-07-01 MED ORDER — MIRABEGRON ER 50 MG PO TB24: 50.0000 mg | ORAL_TABLET | Freq: Every day | ORAL | 1 refills | Status: DC

## 2017-07-01 MED ORDER — AZILSARTAN MEDOXOMIL 80 MG PO TABS: 1.0000 | ORAL_TABLET | Freq: Every day | ORAL | 1 refills | Status: DC

## 2017-07-01 MED ORDER — SPIRONOLACTONE 25 MG PO TABS: 25.0000 mg | ORAL_TABLET | Freq: Every day | ORAL | 0 refills | Status: DC

## 2017-07-01 NOTE — Patient Instructions (Signed)

## 2017-07-01 NOTE — Progress Notes (Signed)
Subjective:  Patient ID: Virginia Edwards, female    DOB: 05/19/1925  Age: 84 y.o. MRN: [id number]  CC: Hypertension   HPI Virginia Edwards presents for f/up - She is with her daughter today.  She is concerned that her blood pressure is not well controlled.  She is not taking the ARB because she thought it was a diuretic and was causing her to urinate frequently.  She has a history of OAB which is not being treated.  She has had a few episodes of blurred vision and she complains of lower extremity edema.  She denies headache, chest pain, palpitations, dizziness, or lightheadedness.  Outpatient Medications Prior to Visit  Medication Sig Dispense Refill  . atorvastatin (LIPITOR) 40 MG tablet Take 1 tablet (40 mg total) by mouth daily at 6 PM. 90 tablet 3  . b complex vitamins tablet Take 1 tablet by mouth daily.     . Cholecalciferol (VITAMIN D3) 1000 units CAPS Take 2 capsules (2,000 Units total) by mouth daily. 180 capsule 3  . traMADol (ULTRAM) 50 MG tablet Take 1 tablet (50 mg total) by mouth every 6 (six) hours as needed. 60 tablet 5  . VENTOLIN HFA 108 (90 Base) MCG/ACT inhaler INL 2 PFS ITL PO Q 4 TO 6 H PRF WHEEZING OR SOB  0  . aspirin EC 81 MG tablet Take 81 mg by mouth daily.    . hydrALAZINE (APRESOLINE) 50 MG tablet TK 1 T PO 1 HOUR BEFORE BED  0  . potassium chloride SA (K-DUR,KLOR-CON) 20 MEQ tablet TAKE 1 TABLET BY MOUTH TWICE DAILY 180 tablet 1  . Azilsartan Medoxomil (EDARBI) 80 MG TABS Take 1 tablet (80 mg total) by mouth daily. (Patient not taking: Reported on 07/01/2017) 30 tablet 0  . solifenacin (VESICARE) 5 MG tablet Take 1 tablet (5 mg total) by mouth daily. 90 tablet 3   No facility-administered medications prior to visit.     ROS Review of Systems  Constitutional: Negative.  Negative for appetite change, diaphoresis, fatigue and unexpected weight change.  HENT: Negative.   Eyes: Positive for visual disturbance.  Respiratory: Negative.  Negative for apnea, cough, chest  tightness, shortness of breath and wheezing.   Cardiovascular: Positive for leg swelling. Negative for chest pain and palpitations.  Gastrointestinal: Negative for abdominal pain, constipation, diarrhea, nausea and vomiting.  Endocrine: Positive for polyuria. Negative for polydipsia and polyphagia.  Genitourinary: Positive for frequency.  Musculoskeletal: Negative.  Negative for arthralgias and myalgias.  Skin: Negative.   Neurological: Negative.  Negative for dizziness, weakness, light-headedness and headaches.  Hematological: Negative for adenopathy. Does not bruise/bleed easily.  Psychiatric/Behavioral: Negative.     Objective:  BP (!) 200/80 (BP Location: Left Arm, Patient Position: Sitting, Cuff Size: Normal)   Pulse 79   Temp 98.7 F (37.1 C) (Oral)   Resp 16   Ht 5\' 2"  (1.575 m)   Wt 145 lb 4 oz (65.9 kg)   SpO2 98%   BMI 26.57 kg/m   BP Readings from Last 3 Encounters:  07/01/17 (!) 200/80  09/19/16 (!) 142/88  05/14/16 132/88    Wt Readings from Last 3 Encounters:  07/01/17 145 lb 4 oz (65.9 kg)  09/19/16 145 lb (65.8 kg)  05/14/16 148 lb 8 oz (67.4 kg)    Physical Exam  Constitutional: She is oriented to person, place, and time. No distress.  HENT:  Mouth/Throat: Oropharynx is clear and moist. No oropharyngeal exudate.  Eyes: Conjunctivae are normal. No scleral icterus.  Neck: Normal range of motion. Neck supple. No JVD present. No thyromegaly present.  Cardiovascular: Normal rate, regular rhythm, S1 normal and S2 normal. Exam reveals no gallop.  Murmur heard.  Systolic murmur is present with a grade of 2/6.  No diastolic murmur is present. Pulmonary/Chest: Effort normal and breath sounds normal. No respiratory distress. She has no wheezes. She has no rales.  Abdominal: Soft. Bowel sounds are normal. She exhibits no mass. There is no hepatosplenomegaly. There is no tenderness.  Musculoskeletal: Normal range of motion. She exhibits edema (2+ pitting edema in  BLE). She exhibits no tenderness or deformity.  Lymphadenopathy:    She has no cervical adenopathy.  Neurological: She is alert and oriented to person, place, and time.  Skin: Skin is warm and dry. She is not diaphoretic. No erythema. No pallor.  Vitals reviewed.   Lab Results  Component Value Date   WBC 6.5 07/01/2017   HGB 11.5 (L) 07/01/2017   HCT 35.6 (L) 07/01/2017   PLT 174.0 07/01/2017   GLUCOSE 95 07/01/2017   CHOL 158 07/01/2017   TRIG 87.0 07/01/2017   HDL 50.70 07/01/2017   LDLDIRECT 151.0 05/14/2016   LDLCALC 90 07/01/2017   ALT 14 07/01/2017   AST 21 07/01/2017   NA 140 07/01/2017   K 3.8 07/01/2017   CL 104 07/01/2017   CREATININE 0.77 07/01/2017   BUN 17 07/01/2017   CO2 30 07/01/2017   TSH 4.03 07/01/2017   INR 0.94 10/02/2011   HGBA1C 5.8 09/22/2014    Ct Head Wo Contrast  Result Date: 08/02/2015 CLINICAL DATA:  Fall with trauma to head. Possible loss of consciousness. EXAM: CT HEAD WITHOUT CONTRAST CT CERVICAL SPINE WITHOUT CONTRAST TECHNIQUE: Multidetector CT imaging of the head and cervical spine was performed following the standard protocol without intravenous contrast. Multiplanar CT image reconstructions of the cervical spine were also generated. COMPARISON:  CT head and cervical spine [facility] 07/24/2015. FINDINGS: CT HEAD FINDINGS Mild atrophy and white matter disease is again noted. No acute infarct, hemorrhage, or mass lesion is present. The ventricles are proportionate to the degree of atrophy. There is no significant extra-axial fluid collection. No significant extracranial soft tissue lesion is present. A left lens replacement is noted. The globes and orbits are otherwise intact. CT CERVICAL SPINE FINDINGS The cervical spine is imaged from the skullbase through T2-3. Grade 1 anterolisthesis at C3-4 is stable. Uncovertebral and facet hypertrophy contribute to multilevel foraminal stenosis. Osseous foraminal narrowing is most severe  on the right at C3-4, C4-5, and C5-6. Left-sided disease is most significant at C4-5. No acute fracture traumatic subluxation is present. The soft tissues of the neck are unremarkable. The lung apices are clear. IMPRESSION: 1. No acute intracranial abnormality or significant interval change. 2. Stable atrophy and white matter disease. 3. Stable multilevel spondylosis of the cervical spine without evidence for acute trauma. Electronically Signed   By: Marin Roberts M.D.   On: 08/02/2015 17:37   Ct Cervical Spine Wo Contrast  Result Date: 08/02/2015 CLINICAL DATA:  Fall with trauma to head. Possible loss of consciousness. EXAM: CT HEAD WITHOUT CONTRAST CT CERVICAL SPINE WITHOUT CONTRAST TECHNIQUE: Multidetector CT imaging of the head and cervical spine was performed following the standard protocol without intravenous contrast. Multiplanar CT image reconstructions of the cervical spine were also generated. COMPARISON:  CT head and cervical spine [facility] 07/24/2015. FINDINGS: CT HEAD FINDINGS Mild atrophy and white matter disease is again noted. No acute  infarct, hemorrhage, or mass lesion is present. The ventricles are proportionate to the degree of atrophy. There is no significant extra-axial fluid collection. No significant extracranial soft tissue lesion is present. A left lens replacement is noted. The globes and orbits are otherwise intact. CT CERVICAL SPINE FINDINGS The cervical spine is imaged from the skullbase through T2-3. Grade 1 anterolisthesis at C3-4 is stable. Uncovertebral and facet hypertrophy contribute to multilevel foraminal stenosis. Osseous foraminal narrowing is most severe on the right at C3-4, C4-5, and C5-6. Left-sided disease is most significant at C4-5. No acute fracture traumatic subluxation is present. The soft tissues of the neck are unremarkable. The lung apices are clear. IMPRESSION: 1. No acute intracranial abnormality or significant interval change. 2.  Stable atrophy and white matter disease. 3. Stable multilevel spondylosis of the cervical spine without evidence for acute trauma. Electronically Signed   By: Marin Roberts M.D.   On: 08/02/2015 17:37   Ct Hip Left Wo Contrast  Result Date: 08/02/2015 CLINICAL DATA:  84 year old female with fall with left hip pain. EXAM: CT OF THE LEFT HIP WITHOUT CONTRAST TECHNIQUE: Multidetector CT imaging of the left hip was performed according to the standard protocol. Multiplanar CT image reconstructions were also generated. COMPARISON:  Radiograph dated 08/02/2015 FINDINGS: There is no acute fracture or dislocation. The bones are osteopenic. There is moderate osteoarthritic changes of the hips. There is a small intramuscular hematoma involving the left psoas major, and iliacus muscles along the left pelvic sidewall as well as anterior to the femoral head. No drainable fluid collection. There is diffuse stranding of the deep fascia of the left thigh. IMPRESSION: No acute fracture or dislocation. Small intramuscular hematoma involving the left psoas and iliacus muscles. Electronically Signed   By: Elgie Collard M.D.   On: 08/02/2015 20:11   Dg Hip Unilat With Pelvis 2-3 Views Left  Result Date: 08/02/2015 CLINICAL DATA:  84 year old female with acute left hip pain following fall today. Initial encounter. EXAM: DG HIP (WITH OR WITHOUT PELVIS) 2-3V LEFT COMPARISON:  07/24/2015 radiographs FINDINGS: There is no evidence of acute fracture, subluxation or dislocation. Mild degenerative changes within both hips are again noted. No focal bony lesions are identified. IMPRESSION: No evidence of acute bony abnormality. Electronically Signed   By: Harmon Pier M.D.   On: 08/02/2015 17:02    Assessment & Plan:   Lennyn was seen today for hypertension.  Diagnoses and all orders for this visit:  Essential hypertension, benign- Her blood pressure is not adequately well controlled.  I encouraged her to start taking the  ARB.  She tends to be hypokalemic so will start spironolactone as well.  I have asked her to stop taking the potassium supplement.  So far, her labs are negative for secondary causes or endorgan damage.  I will screen her for primary aldosteronism. -     CBC with Differential/Platelet; Future -     Comprehensive metabolic panel; Future -     TSH; Future -     Azilsartan Medoxomil (EDARBI) 80 MG TABS; Take 1 tablet (80 mg total) by mouth daily. -     Aldosterone + renin activity w/ ratio; Future -     VITAMIN D 25 Hydroxy (Vit-D Deficiency, Fractures); Future -     spironolactone (ALDACTONE) 25 MG tablet; Take 1 tablet (25 mg total) by mouth daily.  Hyperlipidemia with target LDL less than 100- She has achieved her LDL goal and is doing well on the statin. -  Lipid panel; Future -     TSH; Future  Hyperglycemia- improvement noted -     Comprehensive metabolic panel; Future  Vitamin D deficient osteomalacia -     VITAMIN D 25 Hydroxy (Vit-D Deficiency, Fractures); Future  OAB (overactive bladder) -     mirabegron ER (MYRBETRIQ) 50 MG TB24 tablet; Take 1 tablet (50 mg total) by mouth daily.  Coronary artery disease involving native coronary artery of native heart without angina pectoris -     Azilsartan Medoxomil (EDARBI) 80 MG TABS; Take 1 tablet (80 mg total) by mouth daily.   I have discontinued Nikkol Knepp's aspirin EC, solifenacin, potassium chloride SA, and hydrALAZINE. I am also having her start on mirabegron ER and spironolactone. Additionally, I am having her maintain her b complex vitamins, Vitamin D3, traMADol, atorvastatin, VENTOLIN HFA, and Azilsartan Medoxomil.  Meds ordered this encounter  Medications  . Azilsartan Medoxomil (EDARBI) 80 MG TABS    Sig: Take 1 tablet (80 mg total) by mouth daily.    Dispense:  90 tablet    Refill:  1  . mirabegron ER (MYRBETRIQ) 50 MG TB24 tablet    Sig: Take 1 tablet (50 mg total) by mouth daily.    Dispense:  90 tablet    Refill:   1  . spironolactone (ALDACTONE) 25 MG tablet    Sig: Take 1 tablet (25 mg total) by mouth daily.    Dispense:  90 tablet    Refill:  0     Follow-up: Return in about 3 months (around 10/01/2017).  Sanda Linger, MD

## 2017-07-04 NOTE — Telephone Encounter (Signed)
Pt seen by PCP on 07/01/17. See OV note.  Edarbi PA initiated via CoverMyMeds.  KeyDaune Perch: [id number]  PA Case ID: [id number] Pt ID: [id number]  Ins ph #: [phone] Rx BIN: [id number]

## 2017-07-05 LAB — ALDOSTERONE + RENIN ACTIVITY W/ RATIO
ALDO / PRA RATIO: 11.9 ratio (ref 0.9–28.9)
Aldosterone: 5 ng/dL
Renin Activity: 0.42 ng/mL/h (ref 0.25–5.82)

## 2017-09-09 ENCOUNTER — Other Ambulatory Visit: Payer: Self-pay | Admitting: Internal Medicine

## 2017-09-09 ENCOUNTER — Telehealth: Payer: Self-pay | Admitting: Internal Medicine

## 2017-09-09 DIAGNOSIS — M170 Bilateral primary osteoarthritis of knee: Secondary | ICD-10-CM

## 2017-09-09 NOTE — Telephone Encounter (Signed)
Copied from CRM #[id number]. Topic: Quick Communication - Rx Refill/Question >> Sep 09, 2017 10:43 AM Burchel, Abbi R wrote: Medication: traMADol Janean Sark(ULTRAM) 50 MG tablet   [phone] Pharmacy:  [id number] [place]  - [address] [facility] [address] [place]  [id number] Phone: [phone] Fax: [phone]

## 2017-09-09 NOTE — Telephone Encounter (Signed)
Already refilled in another encounter 09/09/17.

## 2017-09-30 ENCOUNTER — Other Ambulatory Visit: Payer: Self-pay | Admitting: Internal Medicine

## 2017-09-30 DIAGNOSIS — I10 Essential (primary) hypertension: Secondary | ICD-10-CM

## 2017-12-19 ENCOUNTER — Other Ambulatory Visit: Payer: Self-pay | Admitting: Internal Medicine

## 2017-12-19 DIAGNOSIS — I10 Essential (primary) hypertension: Secondary | ICD-10-CM

## 2017-12-19 DIAGNOSIS — I2510 Atherosclerotic heart disease of native coronary artery without angina pectoris: Secondary | ICD-10-CM

## 2018-02-03 ENCOUNTER — Ambulatory Visit (INDEPENDENT_AMBULATORY_CARE_PROVIDER_SITE_OTHER): Payer: Medicare Other | Admitting: Internal Medicine

## 2018-02-03 ENCOUNTER — Other Ambulatory Visit (INDEPENDENT_AMBULATORY_CARE_PROVIDER_SITE_OTHER): Payer: Medicare Other

## 2018-02-03 ENCOUNTER — Encounter: Payer: Self-pay | Admitting: Internal Medicine

## 2018-02-03 VITALS — BP 160/70 | HR 77 | Temp 98.5°F | Ht 62.0 in | Wt 142.2 lb

## 2018-02-03 DIAGNOSIS — Z79891 Long term (current) use of opiate analgesic: Secondary | ICD-10-CM

## 2018-02-03 DIAGNOSIS — R350 Frequency of micturition: Secondary | ICD-10-CM | POA: Insufficient documentation

## 2018-02-03 DIAGNOSIS — Z23 Encounter for immunization: Secondary | ICD-10-CM

## 2018-02-03 DIAGNOSIS — R739 Hyperglycemia, unspecified: Secondary | ICD-10-CM

## 2018-02-03 DIAGNOSIS — I10 Essential (primary) hypertension: Secondary | ICD-10-CM

## 2018-02-03 DIAGNOSIS — N3281 Overactive bladder: Secondary | ICD-10-CM

## 2018-02-03 DIAGNOSIS — M170 Bilateral primary osteoarthritis of knee: Secondary | ICD-10-CM

## 2018-02-03 LAB — CBC WITH DIFFERENTIAL/PLATELET
BASOS PCT: 0.6 % (ref 0.0–3.0)
Basophils Absolute: 0 10*3/uL (ref 0.0–0.1)
Eosinophils Absolute: 0.1 10*3/uL (ref 0.0–0.7)
Eosinophils Relative: 0.9 % (ref 0.0–5.0)
HCT: 33.9 % — ABNORMAL LOW (ref 36.0–46.0)
Hemoglobin: 11 g/dL — ABNORMAL LOW (ref 12.0–15.0)
Lymphocytes Relative: 23.1 % (ref 12.0–46.0)
Lymphs Abs: 1.7 10*3/uL (ref 0.7–4.0)
MCHC: 32.5 g/dL (ref 30.0–36.0)
MCV: 87.4 fl (ref 78.0–100.0)
MONOS PCT: 6.8 % (ref 3.0–12.0)
Monocytes Absolute: 0.5 10*3/uL (ref 0.1–1.0)
Neutro Abs: 5 10*3/uL (ref 1.4–7.7)
Neutrophils Relative %: 68.6 % (ref 43.0–77.0)
Platelets: 185 10*3/uL (ref 150.0–400.0)
RBC: 3.87 Mil/uL (ref 3.87–5.11)
RDW: 14.2 % (ref 11.5–15.5)
WBC: 7.2 10*3/uL (ref 4.0–10.5)

## 2018-02-03 LAB — URINALYSIS, ROUTINE W REFLEX MICROSCOPIC
Bilirubin Urine: NEGATIVE
Hgb urine dipstick: NEGATIVE
Leukocytes, UA: NEGATIVE
Nitrite: NEGATIVE
RBC / HPF: NONE SEEN (ref 0–?)
Specific Gravity, Urine: 1.02 (ref 1.000–1.030)
Total Protein, Urine: NEGATIVE
Urine Glucose: NEGATIVE
Urobilinogen, UA: 1 (ref 0.0–1.0)
pH: 6 (ref 5.0–8.0)

## 2018-02-03 LAB — BASIC METABOLIC PANEL
BUN: 22 mg/dL (ref 6–23)
CO2: 26 mEq/L (ref 19–32)
Calcium: 9.8 mg/dL (ref 8.4–10.5)
Chloride: 108 mEq/L (ref 96–112)
Creatinine, Ser: 0.85 mg/dL (ref 0.40–1.20)
GFR: 66.7 mL/min (ref >60.00)
Glucose, Bld: 99 mg/dL (ref 70–99)
Potassium: 4 mEq/L (ref 3.5–5.1)
Sodium: 141 mEq/L (ref 135–145)

## 2018-02-03 LAB — HEMOGLOBIN A1C: Hgb A1c MFr Bld: 5.9 % (ref 4.6–6.5)

## 2018-02-03 MED ORDER — TRAMADOL HCL 50 MG PO TABS: 50.0000 mg | ORAL_TABLET | Freq: Four times a day (QID) | ORAL | 0 refills | Status: DC | PRN

## 2018-02-03 MED ORDER — MIRABEGRON ER 50 MG PO TB24: 50.0000 mg | ORAL_TABLET | Freq: Every day | ORAL | 1 refills | Status: DC

## 2018-02-03 NOTE — Patient Instructions (Signed)

## 2018-02-03 NOTE — Progress Notes (Signed)
Subjective:  Patient ID: Virginia Edwards, female    DOB: 03/04/1925  Age: 85 y.o. MRN: [id number]  CC: Hypertension   HPI Virginia Edwards presents for f/up - She returns with her daughter.  The main complaint today is low back pain and bilateral knee pain.  She wants to know if she can increase the dose of tramadol.  Outpatient Medications Prior to Visit  Medication Sig Dispense Refill  . b complex vitamins tablet Take 1 tablet by mouth daily.     . Cholecalciferol (VITAMIN D3) 1000 units CAPS Take 2 capsules (2,000 Units total) by mouth daily. 180 capsule 3  . EDARBI 80 MG TABS TAKE 1 TABLET(80 MG) BY MOUTH DAILY 90 tablet 1  . spironolactone (ALDACTONE) 25 MG tablet TAKE 1 TABLET(25 MG) BY MOUTH DAILY 90 tablet 0  . VENTOLIN HFA 108 (90 Base) MCG/ACT inhaler INL 2 PFS ITL PO Q 4 TO 6 H PRF WHEEZING OR SOB  0  . atorvastatin (LIPITOR) 40 MG tablet Take 1 tablet (40 mg total) by mouth daily at 6 PM. 90 tablet 3  . mirabegron ER (MYRBETRIQ) 50 MG TB24 tablet Take 1 tablet (50 mg total) by mouth daily. 90 tablet 1  . traMADol (ULTRAM) 50 MG tablet TAKE 1 TABLET(50 MG) BY MOUTH EVERY 6 HOURS AS NEEDED 60 tablet 3   No facility-administered medications prior to visit.     ROS Review of Systems  Constitutional: Negative.  Negative for diaphoresis, fatigue and unexpected weight change.  HENT: Negative.  Negative for sore throat, trouble swallowing and voice change.   Eyes: Negative for visual disturbance.  Respiratory: Negative for cough, chest tightness and shortness of breath.   Cardiovascular: Negative for chest pain and palpitations.  Gastrointestinal: Negative for abdominal pain, constipation, diarrhea, nausea and vomiting.  Genitourinary: Positive for frequency. Negative for decreased urine volume, difficulty urinating, dysuria, flank pain and urgency.  Musculoskeletal: Positive for arthralgias, back pain and gait problem.  Neurological: Negative for dizziness, weakness, light-headedness  and headaches.  Hematological: Negative for adenopathy. Does not bruise/bleed easily.  Psychiatric/Behavioral: Negative.     Objective:  BP (!) 160/70 (BP Location: Left Arm, Patient Position: Sitting, Cuff Size: Normal)   Pulse 77   Temp 98.5 F (36.9 C) (Oral)   Ht 5\' 2"  (1.575 m)   Wt 142 lb 4 oz (64.5 kg)   SpO2 96%   BMI 26.02 kg/m   BP Readings from Last 3 Encounters:  02/03/18 (!) 160/70  07/01/17 (!) 200/80  09/19/16 (!) 142/88    Wt Readings from Last 3 Encounters:  02/03/18 142 lb 4 oz (64.5 kg)  07/01/17 145 lb 4 oz (65.9 kg)  09/19/16 145 lb (65.8 kg)    Physical Exam Vitals signs reviewed.  Constitutional:      Appearance: She is obese.     Comments: Wheelchair-bound  HENT:     Nose: Nose normal. No congestion.     Mouth/Throat:     Mouth: Mucous membranes are moist.     Pharynx: Oropharynx is clear.  Eyes:     General: No scleral icterus.    Conjunctiva/sclera: Conjunctivae normal.  Neck:     Musculoskeletal: Normal range of motion and neck supple. No muscular tenderness.  Cardiovascular:     Rate and Rhythm: Normal rate and regular rhythm.     Heart sounds: Murmur present. Systolic murmur present with a grade of 1/6. No diastolic murmur. No gallop. No S3 sounds.   Pulmonary:  Effort: Pulmonary effort is normal.     Breath sounds: Normal breath sounds. No wheezing, rhonchi or rales.  Abdominal:     General: Abdomen is flat.     Palpations: There is no mass.     Tenderness: There is no abdominal tenderness. There is no guarding.  Musculoskeletal: Normal range of motion.        General: No swelling.     Right knee: She exhibits deformity (DJD).     Left knee: She exhibits deformity (DJD).     Right lower leg: No edema.     Left lower leg: No edema.  Skin:    General: Skin is warm and dry.  Neurological:     General: No focal deficit present.     Mental Status: She is oriented to person, place, and time. Mental status is at baseline.      Lab Results  Component Value Date   WBC 7.2 02/03/2018   HGB 11.0 (L) 02/03/2018   HCT 33.9 (L) 02/03/2018   PLT 185.0 02/03/2018   GLUCOSE 99 02/03/2018   CHOL 158 07/01/2017   TRIG 87.0 07/01/2017   HDL 50.70 07/01/2017   LDLDIRECT 151.0 05/14/2016   LDLCALC 90 07/01/2017   ALT 14 07/01/2017   AST 21 07/01/2017   NA 141 02/03/2018   K 4.0 02/03/2018   CL 108 02/03/2018   CREATININE 0.85 02/03/2018   BUN 22 02/03/2018   CO2 26 02/03/2018   TSH 4.03 07/01/2017   INR 0.94 10/02/2011   HGBA1C 5.9 02/03/2018    Ct Head Wo Contrast  Result Date: 08/02/2015 CLINICAL DATA:  Fall with trauma to head. Possible loss of consciousness. EXAM: CT HEAD WITHOUT CONTRAST CT CERVICAL SPINE WITHOUT CONTRAST TECHNIQUE: Multidetector CT imaging of the head and cervical spine was performed following the standard protocol without intravenous contrast. Multiplanar CT image reconstructions of the cervical spine were also generated. COMPARISON:  CT head and cervical spine High [facility] 07/24/2015. FINDINGS: CT HEAD FINDINGS Mild atrophy and white matter disease is again noted. No acute infarct, hemorrhage, or mass lesion is present. The ventricles are proportionate to the degree of atrophy. There is no significant extra-axial fluid collection. No significant extracranial soft tissue lesion is present. A left lens replacement is noted. The globes and orbits are otherwise intact. CT CERVICAL SPINE FINDINGS The cervical spine is imaged from the skullbase through T2-3. Grade 1 anterolisthesis at C3-4 is stable. Uncovertebral and facet hypertrophy contribute to multilevel foraminal stenosis. Osseous foraminal narrowing is most severe on the right at C3-4, C4-5, and C5-6. Left-sided disease is most significant at C4-5. No acute fracture traumatic subluxation is present. The soft tissues of the neck are unremarkable. The lung apices are clear. IMPRESSION: 1. No acute intracranial abnormality or  significant interval change. 2. Stable atrophy and white matter disease. 3. Stable multilevel spondylosis of the cervical spine without evidence for acute trauma. Electronically Signed   By: Marin Roberts M.D.   On: 08/02/2015 17:37   Ct Cervical Spine Wo Contrast  Result Date: 08/02/2015 CLINICAL DATA:  Fall with trauma to head. Possible loss of consciousness. EXAM: CT HEAD WITHOUT CONTRAST CT CERVICAL SPINE WITHOUT CONTRAST TECHNIQUE: Multidetector CT imaging of the head and cervical spine was performed following the standard protocol without intravenous contrast. Multiplanar CT image reconstructions of the cervical spine were also generated. COMPARISON:  CT head and cervical spine High [facility] 07/24/2015. FINDINGS: CT HEAD FINDINGS Mild atrophy and white matter disease  is again noted. No acute infarct, hemorrhage, or mass lesion is present. The ventricles are proportionate to the degree of atrophy. There is no significant extra-axial fluid collection. No significant extracranial soft tissue lesion is present. A left lens replacement is noted. The globes and orbits are otherwise intact. CT CERVICAL SPINE FINDINGS The cervical spine is imaged from the skullbase through T2-3. Grade 1 anterolisthesis at C3-4 is stable. Uncovertebral and facet hypertrophy contribute to multilevel foraminal stenosis. Osseous foraminal narrowing is most severe on the right at C3-4, C4-5, and C5-6. Left-sided disease is most significant at C4-5. No acute fracture traumatic subluxation is present. The soft tissues of the neck are unremarkable. The lung apices are clear. IMPRESSION: 1. No acute intracranial abnormality or significant interval change. 2. Stable atrophy and white matter disease. 3. Stable multilevel spondylosis of the cervical spine without evidence for acute trauma. Electronically Signed   By: Marin Roberts M.D.   On: 08/02/2015 17:37   Ct Hip Left Wo Contrast  Result Date:  08/02/2015 CLINICAL DATA:  85 year old female with fall with left hip pain. EXAM: CT OF THE LEFT HIP WITHOUT CONTRAST TECHNIQUE: Multidetector CT imaging of the left hip was performed according to the standard protocol. Multiplanar CT image reconstructions were also generated. COMPARISON:  Radiograph dated 08/02/2015 FINDINGS: There is no acute fracture or dislocation. The bones are osteopenic. There is moderate osteoarthritic changes of the hips. There is a small intramuscular hematoma involving the left psoas major, and iliacus muscles along the left pelvic sidewall as well as anterior to the femoral head. No drainable fluid collection. There is diffuse stranding of the deep fascia of the left thigh. IMPRESSION: No acute fracture or dislocation. Small intramuscular hematoma involving the left psoas and iliacus muscles. Electronically Signed   By: Elgie Collard M.D.   On: 08/02/2015 20:11   Dg Hip Unilat With Pelvis 2-3 Views Left  Result Date: 08/02/2015 CLINICAL DATA:  85 year old female with acute left hip pain following fall today. Initial encounter. EXAM: DG HIP (WITH OR WITHOUT PELVIS) 2-3V LEFT COMPARISON:  07/24/2015 radiographs FINDINGS: There is no evidence of acute fracture, subluxation or dislocation. Mild degenerative changes within both hips are again noted. No focal bony lesions are identified. IMPRESSION: No evidence of acute bony abnormality. Electronically Signed   By: Harmon Pier M.D.   On: 08/02/2015 17:02    Assessment & Plan:   Samanta was seen today for hypertension.  Diagnoses and all orders for this visit:  Essential hypertension, benign- Her blood pressure is well controlled.  Electrolytes and renal function are normal.  Will continue the current ARB dose. -     Basic metabolic panel; Future -     CBC with Differential/Platelet; Future  Hyperglycemia- Her A1c is at 5.9%. -     Basic metabolic panel; Future -     Hemoglobin A1c; Future  Urinary frequency- I will check  her UA and urine culture to screen for infection.  I have also asked her to restart Myrbetriq for OAB. -     Urinalysis, Routine w reflex microscopic; Future -     CULTURE, URINE COMPREHENSIVE; Future  Encounter for long-term opiate analgesic use- I will check a UDS to screen for compliance. -     Pain Mgmt, Tramadol w/medMATCH, U -     Pain Mgmt, Profile 8 w/Conf, U; Future  OAB (overactive bladder) -     mirabegron ER (MYRBETRIQ) 50 MG TB24 tablet; Take 1 tablet (50 mg total) by  mouth daily.  Primary osteoarthritis of both knees -     traMADol (ULTRAM) 50 MG tablet; Take 1 tablet (50 mg total) by mouth every 6 (six) hours as needed.  Need for influenza vaccination -     Flu vaccine HIGH DOSE PF (Fluzone High dose)   I have discontinued Marylu Lund Edwards atorvastatin. I have also changed her traMADol. Additionally, I am having her maintain her b complex vitamins, Vitamin D3, VENTOLIN HFA, spironolactone, EDARBI, and mirabegron ER.  Meds ordered this encounter  Medications  . mirabegron ER (MYRBETRIQ) 50 MG TB24 tablet    Sig: Take 1 tablet (50 mg total) by mouth daily.    Dispense:  90 tablet    Refill:  1  . traMADol (ULTRAM) 50 MG tablet    Sig: Take 1 tablet (50 mg total) by mouth every 6 (six) hours as needed.    Dispense:  90 tablet    Refill:  0     Follow-up: Return in about 4 months (around 06/04/2018).  Sanda Linger, MD

## 2018-02-04 LAB — PAIN MGMT, PROFILE 8 W/CONF, U
6 Acetylmorphine: NEGATIVE ng/mL (ref <10)
ALCOHOL METABOLITES: NEGATIVE ng/mL (ref <500)
Amphetamines: NEGATIVE ng/mL (ref <500)
Benzodiazepines: NEGATIVE ng/mL (ref <100)
Buprenorphine, Urine: NEGATIVE ng/mL (ref <5)
Cocaine Metabolite: NEGATIVE ng/mL (ref <150)
Creatinine: 171.6 mg/dL
MDMA: NEGATIVE ng/mL (ref <500)
Marijuana Metabolite: NEGATIVE ng/mL (ref <20)
OXYCODONE: NEGATIVE ng/mL (ref <100)
Opiates: NEGATIVE ng/mL (ref <100)
Oxidant: NEGATIVE ug/mL (ref <200)
pH: 7.45 (ref 4.5–9.0)

## 2018-02-04 LAB — CULTURE, URINE COMPREHENSIVE
MICRO NUMBER:: 52772
SPECIMEN QUALITY:: ADEQUATE

## 2018-02-05 ENCOUNTER — Encounter: Payer: Self-pay | Admitting: Internal Medicine

## 2018-02-20 ENCOUNTER — Telehealth: Payer: Self-pay | Admitting: Internal Medicine

## 2018-02-20 NOTE — Telephone Encounter (Signed)
Do they need to be mailed to her new address?

## 2018-02-20 NOTE — Telephone Encounter (Signed)
Tried to call pt to inform that we have not sent any correspondence. I have though mail lab notes to the new address.

## 2018-02-20 NOTE — Telephone Encounter (Signed)
Copied from CRM [id number]. Topic: General - Other >> Feb 20, 2018 12:31 PM Tamela Oddi wrote: Reason for CRM: Patient called to inform the office that she has a new address.  Agent recorded the information in the patient's demographic section.  Patient was concerned that her test results might have been mailed to her old address.  Please advise and call patient if any correspondence was sent to the old address.  CB# [phone]

## 2018-02-24 ENCOUNTER — Encounter (HOSPITAL_BASED_OUTPATIENT_CLINIC_OR_DEPARTMENT_OTHER): Payer: Self-pay | Admitting: Emergency Medicine

## 2018-02-24 ENCOUNTER — Emergency Department (HOSPITAL_BASED_OUTPATIENT_CLINIC_OR_DEPARTMENT_OTHER): Payer: Medicare Other

## 2018-02-24 ENCOUNTER — Other Ambulatory Visit: Payer: Self-pay

## 2018-02-24 ENCOUNTER — Emergency Department (HOSPITAL_BASED_OUTPATIENT_CLINIC_OR_DEPARTMENT_OTHER)
Admission: EM | Admit: 2018-02-24 | Discharge: 2018-02-25 | Disposition: A | Discharge status: Home / Self Care | Payer: Medicare Other | Attending: Emergency Medicine | Admitting: Emergency Medicine

## 2018-02-24 DIAGNOSIS — Y92009 Unspecified place in unspecified non-institutional (private) residence as the place of occurrence of the external cause: Secondary | ICD-10-CM | POA: Insufficient documentation

## 2018-02-24 DIAGNOSIS — F0390 Unspecified dementia without behavioral disturbance: Secondary | ICD-10-CM | POA: Diagnosis not present

## 2018-02-24 DIAGNOSIS — R509 Fever, unspecified: Secondary | ICD-10-CM | POA: Diagnosis not present

## 2018-02-24 DIAGNOSIS — F0391 Unspecified dementia with behavioral disturbance: Secondary | ICD-10-CM

## 2018-02-24 DIAGNOSIS — Z96651 Presence of right artificial knee joint: Secondary | ICD-10-CM | POA: Diagnosis not present

## 2018-02-24 DIAGNOSIS — Y939 Activity, unspecified: Secondary | ICD-10-CM | POA: Diagnosis not present

## 2018-02-24 DIAGNOSIS — S6291XD Unspecified fracture of right wrist and hand, subsequent encounter for fracture with routine healing: Secondary | ICD-10-CM | POA: Diagnosis not present

## 2018-02-24 DIAGNOSIS — Y999 Unspecified external cause status: Secondary | ICD-10-CM | POA: Insufficient documentation

## 2018-02-24 DIAGNOSIS — I2510 Atherosclerotic heart disease of native coronary artery without angina pectoris: Secondary | ICD-10-CM | POA: Insufficient documentation

## 2018-02-24 DIAGNOSIS — W19XXXA Unspecified fall, initial encounter: Secondary | ICD-10-CM | POA: Diagnosis not present

## 2018-02-24 DIAGNOSIS — S62101A Fracture of unspecified carpal bone, right wrist, initial encounter for closed fracture: Secondary | ICD-10-CM | POA: Diagnosis not present

## 2018-02-24 DIAGNOSIS — Z79899 Other long term (current) drug therapy: Secondary | ICD-10-CM | POA: Insufficient documentation

## 2018-02-24 DIAGNOSIS — I10 Essential (primary) hypertension: Secondary | ICD-10-CM | POA: Insufficient documentation

## 2018-02-24 DIAGNOSIS — X58XXXD Exposure to other specified factors, subsequent encounter: Secondary | ICD-10-CM | POA: Insufficient documentation

## 2018-02-24 DIAGNOSIS — S6991XA Unspecified injury of right wrist, hand and finger(s), initial encounter: Secondary | ICD-10-CM | POA: Diagnosis present

## 2018-02-24 DIAGNOSIS — R399 Unspecified symptoms and signs involving the genitourinary system: Secondary | ICD-10-CM | POA: Insufficient documentation

## 2018-02-24 MED ORDER — TRAMADOL HCL 50 MG PO TABS
50.0000 mg | ORAL_TABLET | Freq: Once | ORAL | Status: AC
  Administered 2018-02-25: 50 mg via ORAL
  Filled 2018-02-24: qty 1

## 2018-02-24 NOTE — ED Provider Notes (Signed)
TIME SEEN: 11:41 PM  CHIEF COMPLAINT: Fall  HPI: Patient is a 85 year old female with history of hypertension, hyperlipidemia, CAD who presents emergency department with her daughter after a fall.  She states that she fell onto the floor.  Unsure if she hit her head or lost consciousness.  Daughter reports she is on aspirin but no anticoagulation.  Complaining of right wrist and hand pain.  She is right-hand dominant.  Has chronic low back pain but unable to tell me if this is worse than normal.  Daughter felt like she was limping using her right leg but patient denies any lower extremity pain.  Uses a walker at baseline.  Lives at home with her granddaughter.  Patient is unable to tell me why she fell.  Was found to have an oral temperature of 100.2 here.  Does report several days of a cough.  No nausea, vomiting or abdominal pain.  Daughter reports that she recently had viral illness herself but was not around the patient.  Patient has had a flu shot this year.  Patient was not aware that she had fever.  She denies any recent chills.  ROS: See HPI Constitutional: no fever  Eyes: no drainage  ENT: no runny nose   Cardiovascular:  no chest pain  Resp: no SOB  GI: no vomiting GU: no dysuria Integumentary: no rash  Allergy: no hives  Musculoskeletal: no leg swelling  Neurological: no slurred speech ROS otherwise negative  PAST MEDICAL HISTORY/PAST SURGICAL HISTORY:  Past Medical History:  Diagnosis Date  . Arthritis   . CAD (coronary artery disease)   . Cataracts, bilateral   . Chronic back pain   . Eczema   . Hyperlipidemia   . Hypertension    primary Dr. Sanda Linger [phone], saw Dr. Jens Som x 1 cardio  . Osteoporosis   . Sciatica     MEDICATIONS:  Prior to Admission medications   Medication Sig Start Date End Date Taking? Authorizing Provider  b complex vitamins tablet Take 1 tablet by mouth daily.     [provider]  Cholecalciferol (VITAMIN D3) 1000 units  CAPS Take 2 capsules (2,000 Units total) by mouth daily. 05/15/16   Etta Grandchild, MD  EDARBI 80 MG TABS TAKE 1 TABLET(80 MG) BY MOUTH DAILY 12/20/17   Etta Grandchild, MD  mirabegron ER (MYRBETRIQ) 50 MG TB24 tablet Take 1 tablet (50 mg total) by mouth daily. 02/03/18   Etta Grandchild, MD  spironolactone (ALDACTONE) 25 MG tablet TAKE 1 TABLET(25 MG) BY MOUTH DAILY 09/30/17   Etta Grandchild, MD  traMADol (ULTRAM) 50 MG tablet Take 1 tablet (50 mg total) by mouth every 6 (six) hours as needed. 02/03/18   Etta Grandchild, MD  VENTOLIN HFA 108 (90 Base) MCG/ACT inhaler INL 2 PFS ITL PO Q 4 TO 6 H PRF WHEEZING OR SOB 05/14/17   [provider]    ALLERGIES:  Allergies  Allergen Reactions  . Alendronate Sodium Anaphylaxis    Throat swells  . Amlodipine Swelling    Leg edema  . Shellfish Allergy Other (See Comments)    Blister on leg    SOCIAL HISTORY:  Social History   Tobacco Use  . Smoking status: Never Smoker  . Smokeless tobacco: Never Used  Substance Use Topics  . Alcohol use: No    FAMILY HISTORY: Family History  Problem Relation Age of Onset  . Lung cancer Father   . Heart Problems Brother   .  Heart attack Brother   . Hypertension Sister     EXAM: BP (!) 192/106 (BP Location: Left Arm)   Pulse 98   Temp 100.2 F (37.9 C) (Oral)   Resp 20   Ht 5\' 2"  (1.575 m)   Wt 59.6 kg   SpO2 100%   BMI 24.04 kg/m  CONSTITUTIONAL: Alert and oriented and responds appropriately to questions.  Elderly, in no distress, patient appears intermittently confused and is asking some questions repeatedly HEAD: Normocephalic; atraumatic EYES: Conjunctivae clear, PERRL, EOMI ENT: normal nose; no rhinorrhea; moist mucous membranes; pharynx without lesions noted; no dental injury; no septal hematoma NECK: Supple, no meningismus, no LAD; no midline spinal tenderness, step-off or deformity; trachea midline CARD: RRR; S1 and S2 appreciated; no murmurs, no clicks, no rubs, no  gallops RESP: Normal chest excursion without splinting or tachypnea; breath sounds clear and equal bilaterally; no wheezes, no rhonchi, no rales; no hypoxia or respiratory distress CHEST:  chest wall stable, no crepitus or ecchymosis or deformity, nontender to palpation; no flail chest ABD/GI: Normal bowel sounds; non-distended; soft, non-tender, no rebound, no guarding; no ecchymosis or other lesions noted PELVIS:  stable, nontender to palpation BACK:  The back appears normal and is bar spine without step-off or deformity EXT: She has soft tissue swelling and ecchymosis noted to the right wrist and hand especially along the third right digit.  She has decreased grip strength in the right hand secondary to pain.  2+ right radial pulse and normal capillary refill.  Compartments in the right arm are soft.  No other extremity injury on examination.  No other joint effusions noted. SKIN: Normal color for age and race; warm NEURO: Moves all extremities equally sensation diffusely PSYCH: The patient's mood and manner are appropriate. Grooming and personal hygiene are appropriate.  MEDICAL DECISION MAKING: Patient here with fall.  Unable to tell us why she fell.  X-rays show fracture of the right wrist.  She will need a splint.  Will obtain CT of the head and cervical spine.  She does have some tenderness in the lumbar spine but this may be chronic in nature but patient is a poor historian.  Will obtain x-ray.  She was also found to have a low-grade fever here.  Reports cough for the past several days.  Will check labs, chest x-ray and urine.  She does not appear toxic here in the ED.  Will give Tylenol, tramadol for symptomatic relief.  ED PROGRESS: Patient began to become combative, yelling at staff and refusing further treatment.  Her daughter states that this has been something that the granddaughter has been witnessing for the past several months.  I suspect that the patient has underlying dementia.   Daughter states that she has never been diagnosed with this before.  I suspect that she has sundowning.  Initially family was going to take patient home without further work-up.  Patient then became calm and we were able to obtain labs, further imaging.  CT of the head and cervical spine showed no acute injury.  Chest x-ray is clear.  X-ray of the lumbar spine normal.  Unable to obtain urine sample.  Discussed with patient's daughter that in order to obtain a urinalysis from patient we may have to perform a catheterization but in order to do this we would need to sedate her.  We both feel that the risk outweighed the benefit.  She would like to take the patient home without waiting any longer in the  ED to try to obtain a urine sample.  I did discuss with daughter that the UTI could cause fever and would need to be checked by the primary care physician in the next 1 to 2 days.  She states she feels comfortable with close follow-up with Dr. Yetta BarreJones, patient's PCP as an outpatient.  Patient has a walker at home and again lives with her granddaughter who can monitor her closely.  She has been placed in a splint for her left wrist fracture and given outpatient orthopedic follow-up.  I doubt that this patient would be good surgical candidate and patient's daughter agrees.  We have discussed at length that patient may benefit from placement into a nursing facility/memory care unit which can be facilitated by her PCP.  I do not feel she needs emergent Geri psychiatric placement and's daughter agrees.  Daughter states that the granddaughter feels comfortable taking care of her at home and they do not feel this time that she is a danger to herself or others.  Her daughter reports that has been a progressive decline over the past several months.  Her daughter reports that she normally only sees her in the daytime for several hours 1-2 times a week but it is the granddaughter who has been noticing change in her behavior mostly at  night.  Discussed with daughter that this can be normal behavior with dementia.  Discussed at length return precautions with patient's daughter.  Have also advised her that she may need to reach out to the primary care physician regarding gaining power of attorney for the patient.  Patient's daughter reports that patient has tramadol at home.  She can take this as needed for pain from her fracture.  Tramadol seems to have controlled her pain well in the ED today.   At this time, I do not feel there is any life-threatening condition present. I have reviewed and discussed all results (EKG, imaging, lab, urine as appropriate) and exam findings with patient/family. I have reviewed nursing notes and appropriate previous records.  I feel the patient is safe to be discharged home without further emergent workup and can continue workup as an outpatient as needed. Discussed usual and customary return precautions. Patient/family verbalize understanding and are comfortable with this plan.  Outpatient follow-up has been provided as needed. All questions have been answered.     SPLINT APPLICATION Date/Time: 12:02 AM Authorized by: Baxter HireKristen Taeshawn Helfman Consent: Verbal consent obtained. Risks and benefits: risks, benefits and alternatives were discussed Consent given by: patient Splint applied by: orthopedic technician Location details: left wrist Splint type: sugar tong Supplies used: fiberglass, ace wrap Post-procedure: The splinted body part was neurovascularly unchanged following the procedure. Patient tolerance: Patient tolerated the procedure well with no immediate complications.       Bane Hagy, Layla MawKristen N, DO 02/25/18 [phone]

## 2018-02-24 NOTE — ED Triage Notes (Signed)
Pt states she fell yesterday at home no c/o right arm pain, swollen and discoloration.

## 2018-02-24 NOTE — ED Notes (Signed)
ED Provider at bedside. 

## 2018-02-25 ENCOUNTER — Emergency Department (HOSPITAL_BASED_OUTPATIENT_CLINIC_OR_DEPARTMENT_OTHER): Payer: Medicare Other

## 2018-02-25 ENCOUNTER — Other Ambulatory Visit: Payer: Self-pay

## 2018-02-25 ENCOUNTER — Emergency Department (HOSPITAL_COMMUNITY)
Admission: EM | Admit: 2018-02-25 | Discharge: 2018-02-26 | Disposition: A | Discharge status: Home / Self Care | Payer: Medicare Other | Source: Home / Self Care | Attending: Emergency Medicine | Admitting: Emergency Medicine

## 2018-02-25 ENCOUNTER — Encounter (HOSPITAL_COMMUNITY): Payer: Self-pay | Admitting: Emergency Medicine

## 2018-02-25 DIAGNOSIS — Z79899 Other long term (current) drug therapy: Secondary | ICD-10-CM | POA: Insufficient documentation

## 2018-02-25 DIAGNOSIS — I10 Essential (primary) hypertension: Secondary | ICD-10-CM

## 2018-02-25 DIAGNOSIS — R399 Unspecified symptoms and signs involving the genitourinary system: Secondary | ICD-10-CM | POA: Insufficient documentation

## 2018-02-25 DIAGNOSIS — I2510 Atherosclerotic heart disease of native coronary artery without angina pectoris: Secondary | ICD-10-CM | POA: Insufficient documentation

## 2018-02-25 DIAGNOSIS — F0390 Unspecified dementia without behavioral disturbance: Secondary | ICD-10-CM | POA: Insufficient documentation

## 2018-02-25 DIAGNOSIS — X58XXXD Exposure to other specified factors, subsequent encounter: Secondary | ICD-10-CM

## 2018-02-25 DIAGNOSIS — S6291XD Unspecified fracture of right wrist and hand, subsequent encounter for fracture with routine healing: Secondary | ICD-10-CM

## 2018-02-25 DIAGNOSIS — Z96651 Presence of right artificial knee joint: Secondary | ICD-10-CM | POA: Insufficient documentation

## 2018-02-25 DIAGNOSIS — S6991XD Unspecified injury of right wrist, hand and finger(s), subsequent encounter: Secondary | ICD-10-CM

## 2018-02-25 HISTORY — DX: Unspecified dementia, unspecified severity, without behavioral disturbance, psychotic disturbance, mood disturbance, and anxiety: F03.90

## 2018-02-25 LAB — COMPREHENSIVE METABOLIC PANEL
ALT: 18 U/L (ref 0–44)
AST: 25 U/L (ref 15–41)
Albumin: 3.9 g/dL (ref 3.5–5.0)
Alkaline Phosphatase: 73 U/L (ref 38–126)
Anion gap: 7 (ref 5–15)
BUN: 24 mg/dL — ABNORMAL HIGH (ref 8–23)
CO2: 23 mmol/L (ref 22–32)
Calcium: 9.7 mg/dL (ref 8.9–10.3)
Chloride: 106 mmol/L (ref 98–111)
Creatinine, Ser: 0.74 mg/dL (ref 0.44–1.00)
GFR calc Af Amer: >60 mL/min (ref >60)
GFR calc non Af Amer: >60 mL/min (ref >60)
Glucose, Bld: 127 mg/dL — ABNORMAL HIGH (ref 70–99)
Potassium: 3.4 mmol/L — ABNORMAL LOW (ref 3.5–5.1)
Sodium: 136 mmol/L (ref 135–145)
TOTAL PROTEIN: 7.4 g/dL (ref 6.5–8.1)
Total Bilirubin: 0.9 mg/dL (ref 0.3–1.2)

## 2018-02-25 LAB — CBC WITH DIFFERENTIAL/PLATELET
ABS IMMATURE GRANULOCYTES: 0.03 10*3/uL (ref 0.00–0.07)
Basophils Absolute: 0 10*3/uL (ref 0.0–0.1)
Basophils Relative: 0 %
Eosinophils Absolute: 0.2 10*3/uL (ref 0.0–0.5)
Eosinophils Relative: 2 %
HCT: 38.6 % (ref 36.0–46.0)
Hemoglobin: 11.4 g/dL — ABNORMAL LOW (ref 12.0–15.0)
Immature Granulocytes: 0 %
LYMPHS ABS: 1.4 10*3/uL (ref 0.7–4.0)
Lymphocytes Relative: 12 %
MCH: 26.9 pg (ref 26.0–34.0)
MCHC: 29.5 g/dL — ABNORMAL LOW (ref 30.0–36.0)
MCV: 91 fL (ref 80.0–100.0)
Monocytes Absolute: 1 10*3/uL (ref 0.1–1.0)
Monocytes Relative: 9 %
Neutro Abs: 9 10*3/uL — ABNORMAL HIGH (ref 1.7–7.7)
Neutrophils Relative %: 77 %
Platelets: 202 10*3/uL (ref 150–400)
RBC: 4.24 MIL/uL (ref 3.87–5.11)
RDW: 13.2 % (ref 11.5–15.5)
WBC: 11.6 10*3/uL — ABNORMAL HIGH (ref 4.0–10.5)
nRBC: 0 % (ref 0.0–0.2)

## 2018-02-25 LAB — URINALYSIS, ROUTINE W REFLEX MICROSCOPIC
Bilirubin Urine: NEGATIVE
Glucose, UA: NEGATIVE mg/dL
Ketones, ur: 5 mg/dL — AB
Nitrite: NEGATIVE
Protein, ur: NEGATIVE mg/dL
SPECIFIC GRAVITY, URINE: 1.026 (ref 1.005–1.030)
pH: 5 (ref 5.0–8.0)

## 2018-02-25 MED ORDER — ACETAMINOPHEN 500 MG PO TABS
1000.0000 mg | ORAL_TABLET | Freq: Once | ORAL | Status: DC
  Filled 2018-02-25: qty 2

## 2018-02-25 NOTE — ED Notes (Signed)
Sugar tong splint placed on patient. Pt then began to pull at bandages and attempt to remove splint. Pt continually accusing daughter at bedside of "being bossy" and "after my stuff." Suspicious of staff members. RN asked patient for urine sample to r/o UTI - pt told RN to "go to hell." Complied with patient's right to refuse and attempted to obtain [facility]. Pt refused, wants her daughter to sign. Refusing for staff to fix splint. Refusing vital signs checks. Refused tylenol for fever management.  Explained to patient multiple times about risks of refusal of care. Pt unable to comprehend risks, more upset at her daughter for "Being bossy." Pt asking for Dr. Yetta BarreJones, states she doesn't trust what ED MD is telling her.   Provided patient's daughter at bedside with D/C instructions and information about how to manage splint. Explained risks of removal of splint. MD provided follow up instructions with RN at bedside. RN escorted patient to car and assisted into car, pt and daughter left at this time with all belongings.

## 2018-02-25 NOTE — ED Provider Notes (Signed)
[facility]-EMERGENCY DEPT Provider Note: [name], MD, FACEP  CSN: [id number] MRN: [id number] ARRIVAL: 02/25/18 at 2100 ROOM: WA22/WA22   CHIEF COMPLAINT  Wrist Pain  Level 5 caveat: Dementia HISTORY OF PRESENT ILLNESS  02/25/18 11:08 PM Virginia Edwards is a 85 y.o. female who was treated yesterday for a right wrist fracture.  She was placed in a plaster splint and sling which she did not tolerate and her family remove this and placed her in an Ace wrap.  They are requesting an alternative splint.  The patient states her wrist does not hurt at the present time.  It has extensive ecchymosis and swelling.  Her family is also concerned she may have a urinary tract infection.  They would like a referral to an assisted living or rehab facility until her wrist heals.   Past Medical History:  Diagnosis Date  . Arthritis   . CAD (coronary artery disease)   . Cataracts, bilateral   . Chronic back pain   . Dementia (HCC)   . Eczema   . Hyperlipidemia   . Hypertension    primary Dr. Sanda Linger [phone], saw Dr. Jens Som x 1 cardio  . Osteoporosis   . Sciatica     Past Surgical History:  Procedure Laterality Date  . ABDOMINAL HYSTERECTOMY    . FRACTURE SURGERY     shoulder surgery 2008  . LIPOMA EXCISION     2011  . TOTAL KNEE ARTHROPLASTY  09/27/2011   Procedure: TOTAL KNEE ARTHROPLASTY;  Surgeon: Thera Flake., MD;  Location: [facility] OR;  Service: Orthopedics;  Laterality: Right;  right total knee arthroplasty    Family History  Problem Relation Age of Onset  . Lung cancer Father   . Heart Problems Brother   . Heart attack Brother   . Hypertension Sister     Social History   Tobacco Use  . Smoking status: Never Smoker  . Smokeless tobacco: Never Used  Substance Use Topics  . Alcohol use: No  . Drug use: No    Prior to Admission medications   Medication Sig Start Date End Date Taking? Authorizing Provider  b complex vitamins tablet Take 1 tablet by mouth daily.      [provider]  Cholecalciferol (VITAMIN D3) 1000 units CAPS Take 2 capsules (2,000 Units total) by mouth daily. 05/15/16   Etta Grandchild, MD  EDARBI 80 MG TABS TAKE 1 TABLET(80 MG) BY MOUTH DAILY 12/20/17   Etta Grandchild, MD  mirabegron ER (MYRBETRIQ) 50 MG TB24 tablet Take 1 tablet (50 mg total) by mouth daily. 02/03/18   Etta Grandchild, MD  spironolactone (ALDACTONE) 25 MG tablet TAKE 1 TABLET(25 MG) BY MOUTH DAILY 09/30/17   Etta Grandchild, MD  traMADol (ULTRAM) 50 MG tablet Take 1 tablet (50 mg total) by mouth every 6 (six) hours as needed. 02/03/18   Etta Grandchild, MD  VENTOLIN HFA 108 (90 Base) MCG/ACT inhaler INL 2 PFS ITL PO Q 4 TO 6 H PRF WHEEZING OR SOB 05/14/17   [provider]    Allergies Alendronate sodium; Amlodipine; and Shellfish allergy   REVIEW OF SYSTEMS     PHYSICAL EXAMINATION  Initial Vital Signs Blood pressure (!) 151/71, pulse 86, temperature 98.7 F (37.1 C), temperature source Oral, resp. rate 18, SpO2 98 %.  Examination General: Well-developed, well-nourished female in no acute distress; appearance consistent with age of record HENT: normocephalic; atraumatic Eyes: pupils equal, round and reactive to light; extraocular  muscles intact; bilateral pseudophakia Neck: supple Heart: regular rate and rhythm Lungs: clear to auscultation bilaterally Abdomen: soft; nondistended; nontender; bowel sounds present Extremities: Deformity, tenderness, swelling and ecchymosis of right wrist with ecchymosis extending up the right forearm Neurologic: Awake, alert; motor function intact in all extremities and symmetric; no facial droop Skin: Warm and dry Psychiatric: Normal mood and affect   RESULTS  Summary of this visit's results, reviewed by myself:   EKG Interpretation  Date/Time:    Ventricular Rate:    PR Interval:    QRS Duration:   QT Interval:    QTC Calculation:   R Axis:     Text Interpretation:        Laboratory  Studies: Results for orders placed or performed during the hospital encounter of 02/25/18 (from the past 24 hour(s))  Urinalysis, Routine w reflex microscopic     Status: Abnormal   Collection Time: 02/25/18  9:21 PM  Result Value Ref Range   Color, Urine AMBER (A) YELLOW   APPearance HAZY (A) CLEAR   Specific Gravity, Urine 1.026 1.005 - 1.030   pH 5.0 5.0 - 8.0   Glucose, UA NEGATIVE NEGATIVE mg/dL   Hgb urine dipstick MODERATE (A) NEGATIVE   Bilirubin Urine NEGATIVE NEGATIVE   Ketones, ur 5 (A) NEGATIVE mg/dL   Protein, ur NEGATIVE NEGATIVE mg/dL   Nitrite NEGATIVE NEGATIVE   Leukocytes, UA SMALL (A) NEGATIVE   RBC / HPF 11-20 0 - 5 RBC/hpf   WBC, UA 0-5 0 - 5 WBC/hpf   Bacteria, UA RARE (A) NONE SEEN   Squamous Epithelial / LPF 0-5 0 - 5   Mucus PRESENT    Hyaline Casts, UA PRESENT    Imaging Studies: Dg Chest 2 View  Result Date: 02/25/2018 CLINICAL DATA:  Status post fall EXAM: CHEST - 2 VIEW COMPARISON:  09/17/2011 FINDINGS: The heart size and mediastinal contours are within normal limits. Both lungs are clear. There is thoracic spine spondylosis. IMPRESSION: No active cardiopulmonary disease. Electronically Signed   By: Elige Ko   On: 02/25/2018 01:57   Dg Lumbar Spine Complete  Result Date: 02/25/2018 CLINICAL DATA:  Status post fall last night EXAM: LUMBAR SPINE - COMPLETE 4+ VIEW COMPARISON:  None. FINDINGS: There is no evidence of lumbar spine fracture. Levoscoliosis of the lumbar spine. Generalized osteopenia. Degenerative disc disease with disc height loss throughout the thoracolumbar spine. There is bilateral facet arthropathy of the lumbar spine most severe at L5-S1. There is abdominal aortic atherosclerosis. IMPRESSION: No acute osseous injury of the lumbar spine. Electronically Signed   By: Elige Ko   On: 02/25/2018 01:53   Dg Shoulder Right  Result Date: 02/24/2018 CLINICAL DATA:  Right shoulder pain after fall at home yesterday. EXAM: RIGHT SHOULDER - 2+  VIEW COMPARISON:  02/09/2006 FINDINGS: Degenerative changes in the glenohumeral and acromioclavicular joints. Subacromial spurring is present. Loss of subacromial space may indicate chronic rotator cuff arthropathy. There is postoperative resection or resorption of the distal right clavicle. No evidence of acute fracture or dislocation of the right shoulder. No focal bone lesion. Soft tissues are unremarkable. IMPRESSION: Degenerative and postoperative changes in the right shoulder. No acute bony abnormalities. Electronically Signed   By: Burman Nieves M.D.   On: 02/24/2018 21:38   Dg Forearm Right  Result Date: 02/24/2018 CLINICAL DATA:  Larey Seat at home yesterday. Right wrist and forearm pain with bruising. EXAM: RIGHT HAND - COMPLETE 3+ VIEW; RIGHT FOREARM - 2 VIEW COMPARISON:  None. FINDINGS:  Three views of the right hand and two views of the right forearm are obtained. Comminuted fractures of the distal right radial metaphysis with transverse fracture line across the metaphysis extending to the radioulnar joint and longitudinal fracture line extending to the radiocarpal joint. Associated ulnar styloid process fracture. Fracture fragments are impacted with minimal dorsal displacement of the distal fracture fragments. Soft tissue swelling over the right wrist. Degenerative changes throughout the interphalangeal joints, first metacarpal phalangeal joint, first carpometacarpal joints, and STT joints. Degenerative changes in the radiocarpal joints with cartilage calcification. Old appearing fracture deformity of the fifth metacarpal bone. The proximal and midshaft of the right radius and ulna appear intact. No evidence of elbow dislocation. IMPRESSION: Comminuted fractures of the distal right radial metaphysis with extension to the radioulnar joint and radiocarpal joint. Associated ulnar styloid process fracture. Degenerative changes in the right hand and wrist. Electronically Signed   By: Burman NievesWilliam  Stevens M.D.    On: 02/24/2018 21:37   Ct Head Wo Contrast  Result Date: 02/25/2018 CLINICAL DATA:  Status post fall, confused EXAM: CT HEAD WITHOUT CONTRAST CT CERVICAL SPINE WITHOUT CONTRAST TECHNIQUE: Multidetector CT imaging of the head and cervical spine was performed following the standard protocol without intravenous contrast. Multiplanar CT image reconstructions of the cervical spine were also generated. COMPARISON:  08/02/2015 FINDINGS: CT HEAD FINDINGS Brain: No evidence of acute infarction, hemorrhage, extra-axial collection, ventriculomegaly, or mass effect. Generalized cerebral atrophy. Periventricular white matter low attenuation likely secondary to microangiopathy. Vascular: Cerebrovascular atherosclerotic calcifications are noted. Skull: Negative for fracture or focal lesion. Sinuses/Orbits: Visualized portions of the orbits are unremarkable. Visualized portions of the paranasal sinuses and mastoid air cells are unremarkable. Other: None. CT CERVICAL SPINE FINDINGS Alignment: 3 mm anterolisthesis of C3 on C4. Skull base and vertebrae: No acute fracture. No primary bone lesion or focal pathologic process. Soft tissues and spinal canal: No prevertebral fluid or swelling. No visible canal hematoma. Disc levels: Degenerative disc disease with disc height loss at C3-4, C4-5, C5-6 and C6-7. Severe bilateral facet arthropathy at C2-3. Broad-based disc osteophyte complex at C3-4 with bilateral uncovertebral degenerative changes, facet arthropathy and foraminal stenosis. At C4-5 there is a broad-based disc osteophyte complex, bilateral uncovertebral degenerative changes, severe bilateral foraminal stenosis, and mild bilateral facet arthropathy. At C5-6 there is a broad-based disc osteophyte complex with bilateral uncovertebral degenerative changes, moderate bilateral foraminal stenosis and bilateral facet arthropathy. At C6-7 there is a broad-based disc osteophyte complex with bilateral uncovertebral degenerative changes,  severe left foraminal stenosis and mild right foraminal stenosis. Upper chest: Lung apices are clear. Other: No fluid collection or hematoma. Bilateral carotid artery atherosclerosis. IMPRESSION: 1. No acute intracranial pathology. 2.  No acute osseous injury of the cervical spine. 3. Cervical spine spondylosis as described above. Electronically Signed   By: Elige KoHetal  Patel   On: 02/25/2018 02:06   Ct Cervical Spine Wo Contrast  Result Date: 02/25/2018 CLINICAL DATA:  Status post fall, confused EXAM: CT HEAD WITHOUT CONTRAST CT CERVICAL SPINE WITHOUT CONTRAST TECHNIQUE: Multidetector CT imaging of the head and cervical spine was performed following the standard protocol without intravenous contrast. Multiplanar CT image reconstructions of the cervical spine were also generated. COMPARISON:  08/02/2015 FINDINGS: CT HEAD FINDINGS Brain: No evidence of acute infarction, hemorrhage, extra-axial collection, ventriculomegaly, or mass effect. Generalized cerebral atrophy. Periventricular white matter low attenuation likely secondary to microangiopathy. Vascular: Cerebrovascular atherosclerotic calcifications are noted. Skull: Negative for fracture or focal lesion. Sinuses/Orbits: Visualized portions of the orbits are unremarkable. Visualized  portions of the paranasal sinuses and mastoid air cells are unremarkable. Other: None. CT CERVICAL SPINE FINDINGS Alignment: 3 mm anterolisthesis of C3 on C4. Skull base and vertebrae: No acute fracture. No primary bone lesion or focal pathologic process. Soft tissues and spinal canal: No prevertebral fluid or swelling. No visible canal hematoma. Disc levels: Degenerative disc disease with disc height loss at C3-4, C4-5, C5-6 and C6-7. Severe bilateral facet arthropathy at C2-3. Broad-based disc osteophyte complex at C3-4 with bilateral uncovertebral degenerative changes, facet arthropathy and foraminal stenosis. At C4-5 there is a broad-based disc osteophyte complex, bilateral  uncovertebral degenerative changes, severe bilateral foraminal stenosis, and mild bilateral facet arthropathy. At C5-6 there is a broad-based disc osteophyte complex with bilateral uncovertebral degenerative changes, moderate bilateral foraminal stenosis and bilateral facet arthropathy. At C6-7 there is a broad-based disc osteophyte complex with bilateral uncovertebral degenerative changes, severe left foraminal stenosis and mild right foraminal stenosis. Upper chest: Lung apices are clear. Other: No fluid collection or hematoma. Bilateral carotid artery atherosclerosis. IMPRESSION: 1. No acute intracranial pathology. 2.  No acute osseous injury of the cervical spine. 3. Cervical spine spondylosis as described above. Electronically Signed   By: Elige KoHetal  Patel   On: 02/25/2018 02:06   Dg Hand Complete Right  Result Date: 02/24/2018 CLINICAL DATA:  Larey SeatFell at home yesterday. Right wrist and forearm pain with bruising. EXAM: RIGHT HAND - COMPLETE 3+ VIEW; RIGHT FOREARM - 2 VIEW COMPARISON:  None. FINDINGS: Three views of the right hand and two views of the right forearm are obtained. Comminuted fractures of the distal right radial metaphysis with transverse fracture line across the metaphysis extending to the radioulnar joint and longitudinal fracture line extending to the radiocarpal joint. Associated ulnar styloid process fracture. Fracture fragments are impacted with minimal dorsal displacement of the distal fracture fragments. Soft tissue swelling over the right wrist. Degenerative changes throughout the interphalangeal joints, first metacarpal phalangeal joint, first carpometacarpal joints, and STT joints. Degenerative changes in the radiocarpal joints with cartilage calcification. Old appearing fracture deformity of the fifth metacarpal bone. The proximal and midshaft of the right radius and ulna appear intact. No evidence of elbow dislocation. IMPRESSION: Comminuted fractures of the distal right radial metaphysis  with extension to the radioulnar joint and radiocarpal joint. Associated ulnar styloid process fracture. Degenerative changes in the right hand and wrist. Electronically Signed   By: Burman NievesWilliam  Stevens M.D.   On: 02/24/2018 21:37    ED COURSE and MDM  Nursing notes and initial vitals signs, including pulse oximetry, reviewed.  Vitals:   02/25/18 2119  BP: (!) 151/71  Pulse: 86  Resp: 18  Temp: 98.7 F (37.1 C)  TempSrc: Oral  SpO2: 98%   Urinalysis not diagnostic of urinary tract infection but we will send for culture.  We will place the patient in a Velcro wrist splint which should provide some support until she can be definitively treated by Dr. Amanda PeaGramig.  We will provide a referral to a social worker for possible placement.  They were also advised to contact her primary care physician as well.  PROCEDURES    ED DIAGNOSES     ICD-10-CM   1. Right wrist injury, subsequent encounter S69.91XD        Paula LibraMolpus, Bertis Hustead, MD 02/25/18 2324

## 2018-02-25 NOTE — ED Notes (Signed)
Pt refusing tylenol, suspicious of staff. Verbally abusive, states "I don't trust you."

## 2018-02-25 NOTE — Discharge Instructions (Addendum)
Please take your tramadol as prescribed for pain.  Please follow-up with Dr. Amanda Pea for your right wrist fracture.  You are also found to have a fever today but have refused further work-up.  I recommend close follow-up with your primary care physician in the next 1 to 2 days.   We have recommended blood work, urine and a chest x-ray today which you have refused.  You may take Tylenol 1000 mg every 6 hours as needed for fever.  We were not able to perform a CT of your head and cervical spine today and are unable to rule out any life-threatening injury such as an intracranial hemorrhage or fracture.  We have discussed these risks with you and if you have changed your mind and would like evaluation, please return the emergency department.   Please use your walker at all times.  Please keep your splint on at all times.  Please keep the splint clean and dry.  Please call and schedule an appointment with a hand surgeon for outpatient follow-up.

## 2018-02-25 NOTE — ED Triage Notes (Signed)
Pt was seen yesterday for wrist pain. EMS reports the patient's family wants patient to have her wrist re-wrapped, and they want her urine checked. EMS denies pt having urinary symptoms and denies AMS.  Pt's family told EMS that they want the patient to be admitted and go to a rehab facility.

## 2018-02-26 NOTE — ED Notes (Signed)
Called lab to add on urine culture ?

## 2018-02-27 LAB — URINE CULTURE

## 2018-03-06 ENCOUNTER — Ambulatory Visit (INDEPENDENT_AMBULATORY_CARE_PROVIDER_SITE_OTHER): Payer: Medicare Other | Admitting: Internal Medicine

## 2018-03-06 ENCOUNTER — Encounter: Payer: Self-pay | Admitting: Internal Medicine

## 2018-03-06 VITALS — BP 140/72 | HR 81 | Temp 98.1°F | Ht 62.0 in | Wt 142.5 lb

## 2018-03-06 DIAGNOSIS — I10 Essential (primary) hypertension: Secondary | ICD-10-CM | POA: Diagnosis not present

## 2018-03-06 NOTE — Patient Instructions (Signed)

## 2018-03-06 NOTE — Progress Notes (Signed)
Subjective:  Patient ID: Virginia Edwards, female    DOB: Apr 04, 1925  Age: 85 y.o. MRN: [id number]  CC: Hypertension   HPI Virginia Edwards presents for a BP check - She fell 10 days ago and fractured her right wrist.  She has been seeing an orthopedist about this.  She is getting adequate pain well controlled with tramadol as needed.  Outpatient Medications Prior to Visit  Medication Sig Dispense Refill  . b complex vitamins tablet Take 1 tablet by mouth daily.     . Cholecalciferol (VITAMIN D3) 1000 units CAPS Take 2 capsules (2,000 Units total) by mouth daily. 180 capsule 3  . EDARBI 80 MG TABS TAKE 1 TABLET(80 MG) BY MOUTH DAILY 90 tablet 1  . mirabegron ER (MYRBETRIQ) 50 MG TB24 tablet Take 1 tablet (50 mg total) by mouth daily. 90 tablet 1  . spironolactone (ALDACTONE) 25 MG tablet TAKE 1 TABLET(25 MG) BY MOUTH DAILY 90 tablet 0  . traMADol (ULTRAM) 50 MG tablet Take 1 tablet (50 mg total) by mouth every 6 (six) hours as needed. 90 tablet 0  . VENTOLIN HFA 108 (90 Base) MCG/ACT inhaler INL 2 PFS ITL PO Q 4 TO 6 H PRF WHEEZING OR SOB  0   No facility-administered medications prior to visit.     ROS Review of Systems  Constitutional: Negative.  Negative for diaphoresis and fatigue.  HENT: Negative.   Eyes: Negative.   Respiratory: Negative for cough, chest tightness and shortness of breath.   Cardiovascular: Negative for chest pain, palpitations and leg swelling.  Gastrointestinal: Negative for abdominal pain, constipation, diarrhea, nausea and vomiting.  Genitourinary: Negative.   Musculoskeletal: Positive for arthralgias. Negative for back pain and neck pain.  Skin: Negative.   Neurological: Negative.  Negative for dizziness, weakness and headaches.  Hematological: Negative for adenopathy. Does not bruise/bleed easily.  Psychiatric/Behavioral: Negative.     Objective:  BP 140/72 (BP Location: Left Arm, Patient Position: Sitting, Cuff Size: Normal)   Pulse 81   Temp 98.1 F  (36.7 C) (Oral)   Ht 5\' 2"  (1.575 m)   Wt 142 lb 8 oz (64.6 kg)   SpO2 99%   BMI 26.06 kg/m   BP Readings from Last 3 Encounters:  03/06/18 140/72  02/26/18 (!) 149/64  02/25/18 (!) 159/94    Wt Readings from Last 3 Encounters:  03/06/18 142 lb 8 oz (64.6 kg)  02/24/18 131 lb 7 oz (59.6 kg)  02/03/18 142 lb 4 oz (64.5 kg)    Physical Exam Constitutional:      Appearance: She is obese. She is not ill-appearing or diaphoretic.  HENT:     Nose: Nose normal. No congestion.     Mouth/Throat:     Pharynx: Oropharynx is clear. No oropharyngeal exudate or posterior oropharyngeal erythema.  Eyes:     General: No scleral icterus.    Conjunctiva/sclera: Conjunctivae normal.  Neck:     Musculoskeletal: Normal range of motion and neck supple. No neck rigidity.  Cardiovascular:     Rate and Rhythm: Normal rate and regular rhythm.     Heart sounds: No murmur. No gallop.   Pulmonary:     Effort: Pulmonary effort is normal. No respiratory distress.     Breath sounds: No stridor. No wheezing.  Chest:     Chest wall: No tenderness.  Abdominal:     General: Abdomen is flat.     Palpations: There is no mass.     Tenderness: There is no  abdominal tenderness.  Musculoskeletal: Normal range of motion.        General: No swelling.     Right lower leg: No edema.     Left lower leg: No edema.     Comments: Right wrist is immobilized in a splint  Lymphadenopathy:     Cervical: No cervical adenopathy.  Skin:    General: Skin is warm and dry.  Neurological:     General: No focal deficit present.     Mental Status: Mental status is at baseline. She is disoriented.     Lab Results  Component Value Date   WBC 11.6 (H) 02/25/2018   HGB 11.4 (L) 02/25/2018   HCT 38.6 02/25/2018   PLT 202 02/25/2018   GLUCOSE 127 (H) 02/25/2018   CHOL 158 07/01/2017   TRIG 87.0 07/01/2017   HDL 50.70 07/01/2017   LDLDIRECT 151.0 05/14/2016   LDLCALC 90 07/01/2017   ALT 18 02/25/2018   AST 25  02/25/2018   NA 136 02/25/2018   K 3.4 (L) 02/25/2018   CL 106 02/25/2018   CREATININE 0.74 02/25/2018   BUN 24 (H) 02/25/2018   CO2 23 02/25/2018   TSH 4.03 07/01/2017   INR 0.94 10/02/2011   HGBA1C 5.9 02/03/2018    Dg Chest 2 View  Result Date: 02/25/2018 CLINICAL DATA:  Status post fall EXAM: CHEST - 2 VIEW COMPARISON:  09/17/2011 FINDINGS: The heart size and mediastinal contours are within normal limits. Both lungs are clear. There is thoracic spine spondylosis. IMPRESSION: No active cardiopulmonary disease. Electronically Signed   By: Elige KoHetal  Patel   On: 02/25/2018 01:57   Dg Lumbar Spine Complete  Result Date: 02/25/2018 CLINICAL DATA:  Status post fall last night EXAM: LUMBAR SPINE - COMPLETE 4+ VIEW COMPARISON:  None. FINDINGS: There is no evidence of lumbar spine fracture. Levoscoliosis of the lumbar spine. Generalized osteopenia. Degenerative disc disease with disc height loss throughout the thoracolumbar spine. There is bilateral facet arthropathy of the lumbar spine most severe at L5-S1. There is abdominal aortic atherosclerosis. IMPRESSION: No acute osseous injury of the lumbar spine. Electronically Signed   By: Elige KoHetal  Patel   On: 02/25/2018 01:53   Ct Head Wo Contrast  Result Date: 02/25/2018 CLINICAL DATA:  Status post fall, confused EXAM: CT HEAD WITHOUT CONTRAST CT CERVICAL SPINE WITHOUT CONTRAST TECHNIQUE: Multidetector CT imaging of the head and cervical spine was performed following the standard protocol without intravenous contrast. Multiplanar CT image reconstructions of the cervical spine were also generated. COMPARISON:  08/02/2015 FINDINGS: CT HEAD FINDINGS Brain: No evidence of acute infarction, hemorrhage, extra-axial collection, ventriculomegaly, or mass effect. Generalized cerebral atrophy. Periventricular white matter low attenuation likely secondary to microangiopathy. Vascular: Cerebrovascular atherosclerotic calcifications are noted. Skull: Negative for fracture or  focal lesion. Sinuses/Orbits: Visualized portions of the orbits are unremarkable. Visualized portions of the paranasal sinuses and mastoid air cells are unremarkable. Other: None. CT CERVICAL SPINE FINDINGS Alignment: 3 mm anterolisthesis of C3 on C4. Skull base and vertebrae: No acute fracture. No primary bone lesion or focal pathologic process. Soft tissues and spinal canal: No prevertebral fluid or swelling. No visible canal hematoma. Disc levels: Degenerative disc disease with disc height loss at C3-4, C4-5, C5-6 and C6-7. Severe bilateral facet arthropathy at C2-3. Broad-based disc osteophyte complex at C3-4 with bilateral uncovertebral degenerative changes, facet arthropathy and foraminal stenosis. At C4-5 there is a broad-based disc osteophyte complex, bilateral uncovertebral degenerative changes, severe bilateral foraminal stenosis, and mild bilateral facet arthropathy. At C5-6  there is a broad-based disc osteophyte complex with bilateral uncovertebral degenerative changes, moderate bilateral foraminal stenosis and bilateral facet arthropathy. At C6-7 there is a broad-based disc osteophyte complex with bilateral uncovertebral degenerative changes, severe left foraminal stenosis and mild right foraminal stenosis. Upper chest: Lung apices are clear. Other: No fluid collection or hematoma. Bilateral carotid artery atherosclerosis. IMPRESSION: 1. No acute intracranial pathology. 2.  No acute osseous injury of the cervical spine. 3. Cervical spine spondylosis as described above. Electronically Signed   By: Elige Ko   On: 02/25/2018 02:06   Ct Cervical Spine Wo Contrast  Result Date: 02/25/2018 CLINICAL DATA:  Status post fall, confused EXAM: CT HEAD WITHOUT CONTRAST CT CERVICAL SPINE WITHOUT CONTRAST TECHNIQUE: Multidetector CT imaging of the head and cervical spine was performed following the standard protocol without intravenous contrast. Multiplanar CT image reconstructions of the cervical spine were  also generated. COMPARISON:  08/02/2015 FINDINGS: CT HEAD FINDINGS Brain: No evidence of acute infarction, hemorrhage, extra-axial collection, ventriculomegaly, or mass effect. Generalized cerebral atrophy. Periventricular white matter low attenuation likely secondary to microangiopathy. Vascular: Cerebrovascular atherosclerotic calcifications are noted. Skull: Negative for fracture or focal lesion. Sinuses/Orbits: Visualized portions of the orbits are unremarkable. Visualized portions of the paranasal sinuses and mastoid air cells are unremarkable. Other: None. CT CERVICAL SPINE FINDINGS Alignment: 3 mm anterolisthesis of C3 on C4. Skull base and vertebrae: No acute fracture. No primary bone lesion or focal pathologic process. Soft tissues and spinal canal: No prevertebral fluid or swelling. No visible canal hematoma. Disc levels: Degenerative disc disease with disc height loss at C3-4, C4-5, C5-6 and C6-7. Severe bilateral facet arthropathy at C2-3. Broad-based disc osteophyte complex at C3-4 with bilateral uncovertebral degenerative changes, facet arthropathy and foraminal stenosis. At C4-5 there is a broad-based disc osteophyte complex, bilateral uncovertebral degenerative changes, severe bilateral foraminal stenosis, and mild bilateral facet arthropathy. At C5-6 there is a broad-based disc osteophyte complex with bilateral uncovertebral degenerative changes, moderate bilateral foraminal stenosis and bilateral facet arthropathy. At C6-7 there is a broad-based disc osteophyte complex with bilateral uncovertebral degenerative changes, severe left foraminal stenosis and mild right foraminal stenosis. Upper chest: Lung apices are clear. Other: No fluid collection or hematoma. Bilateral carotid artery atherosclerosis. IMPRESSION: 1. No acute intracranial pathology. 2.  No acute osseous injury of the cervical spine. 3. Cervical spine spondylosis as described above. Electronically Signed   By: Elige Ko   On:  02/25/2018 02:06    Assessment & Plan:   Virginia Edwards was seen today for hypertension.  Diagnoses and all orders for this visit:  Essential hypertension, benign- Her blood pressure is adequately well controlled.   I am having Virginia Edwards maintain her b complex vitamins, Vitamin D3, VENTOLIN HFA, spironolactone, EDARBI, mirabegron ER, and traMADol.  No orders of the defined types were placed in this encounter.    Follow-up: Return in about 6 months (around 09/04/2018).  Sanda Linger, MD

## 2018-03-15 ENCOUNTER — Telehealth: Payer: Self-pay | Admitting: Internal Medicine

## 2018-03-15 DIAGNOSIS — I2510 Atherosclerotic heart disease of native coronary artery without angina pectoris: Secondary | ICD-10-CM

## 2018-03-15 DIAGNOSIS — E785 Hyperlipidemia, unspecified: Secondary | ICD-10-CM

## 2018-03-23 ENCOUNTER — Other Ambulatory Visit: Payer: Self-pay | Admitting: Internal Medicine

## 2018-03-23 ENCOUNTER — Telehealth: Payer: Self-pay | Admitting: Internal Medicine

## 2018-03-23 DIAGNOSIS — M170 Bilateral primary osteoarthritis of knee: Secondary | ICD-10-CM

## 2018-03-23 MED ORDER — TRAMADOL HCL 50 MG PO TABS: 50.0000 mg | ORAL_TABLET | Freq: Four times a day (QID) | ORAL | 3 refills | Status: DC | PRN

## 2018-03-23 NOTE — Telephone Encounter (Signed)
Copied from CRM [id number]. Topic: Quick Communication - Rx Refill/Question >> Mar 23, 2018  3:03 PM Percival Spanish wrote: Medication   traMADol (ULTRAM) 50 MG tablet  Has the patient contacted their pharmacy yes  .) Agent: If yes, when and what did the pharmacy advise  out of refills   Preferred Pharmacy  [facility] [address]   Agent: Please be advised that RX refills may take up to 3 business days. We ask that you follow-up with your pharmacy.

## 2018-03-25 ENCOUNTER — Telehealth: Payer: Self-pay | Admitting: Internal Medicine

## 2018-03-25 NOTE — Telephone Encounter (Signed)
Patient's daughter came into the office and dropped off a physician's statement to be completed by Dr. Yetta Barre. Patient's daughter states that Stef said that she would help to fill this out. They would like to be called at [phone] when it is ready for pick up. Placed in Dr Yetta Barre' box for completion.

## 2018-03-26 DIAGNOSIS — Z0279 Encounter for issue of other medical certificate: Secondary | ICD-10-CM

## 2018-03-26 NOTE — Telephone Encounter (Signed)
lvm for pt dtr [name]) to call back. I need to speak to her when she does. I have specific questions for her to answer.

## 2018-03-26 NOTE — Telephone Encounter (Signed)
Pt's daughter [name] called to follow up on atorvastatin. She would like to know if her mother is still supposed to be taking this medication or not. She has been without it for a week. Please advise. CB# [phone]

## 2018-03-26 NOTE — Telephone Encounter (Signed)
Spoke to dtr and the medication has been discontinued.

## 2018-03-26 NOTE — Telephone Encounter (Signed)
Spoke to pharmacy and they stated the medication did not need a PA and that the medication was ready for pick up.   Pt dtr called and informed of same.

## 2018-03-26 NOTE — Telephone Encounter (Signed)
Key: Stanford Breed PA information. The PA came back that the medication does not need a PA as it is covered.

## 2018-03-26 NOTE — Telephone Encounter (Signed)
Pt's daughter called to follow up on PA needed for traMADol (ULTRAM) 50 MG tablet Refill. Stated the pharmacy was waiting for PA. Please advise.

## 2018-03-27 NOTE — Telephone Encounter (Signed)
Spoke to pt dtr and forms have been completed signed and picked up.

## 2018-04-08 NOTE — Telephone Encounter (Signed)
Forms have been charged for.  °

## 2018-07-30 ENCOUNTER — Ambulatory Visit: Payer: Medicare Other | Admitting: Internal Medicine

## 2018-07-30 ENCOUNTER — Other Ambulatory Visit: Payer: Self-pay

## 2018-07-30 NOTE — Progress Notes (Signed)
Virtual Visit via Video Note  I attempted to connect with Virginia Edwards on 07/30/18 at  1:20 PM EDT by a video enabled telemedicine application however she did not show and we were unable to reach her via telephone.   Hoyt Koch, MD

## 2018-08-27 ENCOUNTER — Other Ambulatory Visit: Payer: Self-pay | Admitting: Internal Medicine

## 2018-08-27 DIAGNOSIS — I2510 Atherosclerotic heart disease of native coronary artery without angina pectoris: Secondary | ICD-10-CM

## 2018-08-27 DIAGNOSIS — I10 Essential (primary) hypertension: Secondary | ICD-10-CM

## 2018-09-04 ENCOUNTER — Telehealth: Payer: Self-pay | Admitting: Internal Medicine

## 2018-09-04 NOTE — Telephone Encounter (Signed)
Added to upcoming appt

## 2018-09-04 NOTE — Telephone Encounter (Signed)
Pt daughter called and stated that they would like an rx for a walker with wheels sent over to [facility]. Daughter is wanting to know if they will need an appointment.  Please advise

## 2018-09-09 ENCOUNTER — Ambulatory Visit: Payer: Medicare Other | Admitting: Internal Medicine

## 2018-11-09 ENCOUNTER — Other Ambulatory Visit: Payer: Self-pay | Admitting: Internal Medicine

## 2018-11-09 DIAGNOSIS — M170 Bilateral primary osteoarthritis of knee: Secondary | ICD-10-CM

## 2018-11-09 NOTE — Telephone Encounter (Signed)
Medication Refill - Medication: traMADol (ULTRAM) 50 MG tablet    Has the patient contacted their pharmacy? Yes.   PTs daughter states she is completely out of the medication. Please advise.  (Agent: If no, request that the patient contact the pharmacy for the refill.) (Agent: If yes, when and what did the pharmacy advise?)  Preferred Pharmacy (with phone number or street name):  [facility] [address]  [place] [address]  Phone: [phone] Fax: [phone]  Open 24 hours     Agent: Please be advised that RX refills may take up to 3 business days. We ask that you follow-up with your pharmacy.

## 2018-11-09 NOTE — Telephone Encounter (Signed)
Routing to CMA 

## 2018-11-09 NOTE — Telephone Encounter (Signed)
Requested medication (s) are due for refill today:yes  Requested medication (s) are on the active medication list: yes  Last refill:  03/23/2018  Future visit scheduled: no  Notes to clinic:  Refill cannot be delegated   Requested Prescriptions  Pending Prescriptions Disp Refills   traMADol (ULTRAM) 50 MG tablet 90 tablet 3    Sig: Take 1 tablet (50 mg total) by mouth every 6 (six) hours as needed.     Not Delegated - Analgesics:  Opioid Agonists Failed - 11/09/2018 11:15 AM      Failed - This refill cannot be delegated      Passed - Urine Drug Screen completed in last 360 days.      Passed - Valid encounter within last 6 months    Recent Outpatient Visits          3 months ago Erroneous encounter - disregard   [place], MD   8 months ago Essential hypertension, benign   [place], Thomas L, MD   9 months ago Essential hypertension, benign   [place], Thomas L, MD   1 year ago Essential hypertension, benign   [facility] -Mayer Camel, MD   2 years ago Essential hypertension, benign   [facility] -Mayer Camel, MD

## 2018-11-10 MED ORDER — TRAMADOL HCL 50 MG PO TABS: 50.0000 mg | ORAL_TABLET | Freq: Four times a day (QID) | ORAL | 0 refills | Status: DC | PRN

## 2018-11-10 NOTE — Telephone Encounter (Signed)
Check [place] registry last filled 09/01/2018. Md is out of the office this week. Pls advise.Marland KitchenJohny Edwards

## 2018-11-25 ENCOUNTER — Other Ambulatory Visit: Payer: Self-pay

## 2018-11-25 ENCOUNTER — Ambulatory Visit (INDEPENDENT_AMBULATORY_CARE_PROVIDER_SITE_OTHER): Payer: Medicare Other | Admitting: Internal Medicine

## 2018-11-25 ENCOUNTER — Other Ambulatory Visit (INDEPENDENT_AMBULATORY_CARE_PROVIDER_SITE_OTHER): Payer: Medicare Other

## 2018-11-25 ENCOUNTER — Encounter: Payer: Self-pay | Admitting: Internal Medicine

## 2018-11-25 ENCOUNTER — Ambulatory Visit (INDEPENDENT_AMBULATORY_CARE_PROVIDER_SITE_OTHER)
Admission: RE | Admit: 2018-11-25 | Discharge: 2018-11-25 | Disposition: A | Discharge status: Home / Self Care | Payer: Medicare Other | Source: Ambulatory Visit | Attending: Internal Medicine | Admitting: Internal Medicine

## 2018-11-25 VITALS — BP 162/82 | HR 78 | Temp 98.2°F | Resp 16 | Ht 62.0 in | Wt 148.8 lb

## 2018-11-25 DIAGNOSIS — D539 Nutritional anemia, unspecified: Secondary | ICD-10-CM | POA: Diagnosis not present

## 2018-11-25 DIAGNOSIS — R109 Unspecified abdominal pain: Secondary | ICD-10-CM | POA: Diagnosis not present

## 2018-11-25 DIAGNOSIS — Z0001 Encounter for general adult medical examination with abnormal findings: Secondary | ICD-10-CM

## 2018-11-25 DIAGNOSIS — E785 Hyperlipidemia, unspecified: Secondary | ICD-10-CM | POA: Diagnosis not present

## 2018-11-25 DIAGNOSIS — Z23 Encounter for immunization: Secondary | ICD-10-CM

## 2018-11-25 DIAGNOSIS — I2510 Atherosclerotic heart disease of native coronary artery without angina pectoris: Secondary | ICD-10-CM

## 2018-11-25 DIAGNOSIS — M170 Bilateral primary osteoarthritis of knee: Secondary | ICD-10-CM

## 2018-11-25 DIAGNOSIS — R350 Frequency of micturition: Secondary | ICD-10-CM

## 2018-11-25 DIAGNOSIS — Z0000 Encounter for general adult medical examination without abnormal findings: Secondary | ICD-10-CM | POA: Insufficient documentation

## 2018-11-25 DIAGNOSIS — I10 Essential (primary) hypertension: Secondary | ICD-10-CM

## 2018-11-25 DIAGNOSIS — R739 Hyperglycemia, unspecified: Secondary | ICD-10-CM | POA: Diagnosis not present

## 2018-11-25 DIAGNOSIS — K5904 Chronic idiopathic constipation: Secondary | ICD-10-CM

## 2018-11-25 LAB — CBC WITH DIFFERENTIAL/PLATELET
Basophils Absolute: 0 10*3/uL (ref 0.0–0.1)
Basophils Relative: 0.5 % (ref 0.0–3.0)
Eosinophils Absolute: 0.2 10*3/uL (ref 0.0–0.7)
Eosinophils Relative: 2.5 % (ref 0.0–5.0)
HCT: 34.6 % — ABNORMAL LOW (ref 36.0–46.0)
Hemoglobin: 11.1 g/dL — ABNORMAL LOW (ref 12.0–15.0)
Lymphocytes Relative: 27.5 % (ref 12.0–46.0)
Lymphs Abs: 2.4 10*3/uL (ref 0.7–4.0)
MCHC: 31.9 g/dL (ref 30.0–36.0)
MCV: 86.4 fl (ref 78.0–100.0)
Monocytes Absolute: 0.7 10*3/uL (ref 0.1–1.0)
Monocytes Relative: 8 % (ref 3.0–12.0)
Neutro Abs: 5.4 10*3/uL (ref 1.4–7.7)
Neutrophils Relative %: 61.5 % (ref 43.0–77.0)
Platelets: 249 10*3/uL (ref 150.0–400.0)
RBC: 4.01 Mil/uL (ref 3.87–5.11)
RDW: 14.8 % (ref 11.5–15.5)
WBC: 8.7 10*3/uL (ref 4.0–10.5)

## 2018-11-25 LAB — VITAMIN B12: Vitamin B-12: 348 pg/mL (ref 211–911)

## 2018-11-25 LAB — TSH: TSH: 4.5 u[IU]/mL (ref 0.35–4.50)

## 2018-11-25 LAB — LIPID PANEL
Cholesterol: 257 mg/dL — ABNORMAL HIGH (ref 0–200)
HDL: 59.3 mg/dL (ref >39.00)
LDL Cholesterol: 177 mg/dL — ABNORMAL HIGH (ref 0–99)
NonHDL: 197.36
Total CHOL/HDL Ratio: 4
Triglycerides: 103 mg/dL (ref 0.0–149.0)
VLDL: 20.6 mg/dL (ref 0.0–40.0)

## 2018-11-25 LAB — FOLATE: Folate: >24.1 ng/mL (ref >5.9)

## 2018-11-25 LAB — FERRITIN: Ferritin: 9.9 ng/mL — ABNORMAL LOW (ref 10.0–291.0)

## 2018-11-25 LAB — BASIC METABOLIC PANEL
BUN: 25 mg/dL — ABNORMAL HIGH (ref 6–23)
CO2: 28 mEq/L (ref 19–32)
Calcium: 9.6 mg/dL (ref 8.4–10.5)
Chloride: 104 mEq/L (ref 96–112)
Creatinine, Ser: 1 mg/dL (ref 0.40–1.20)
GFR: 51.93 mL/min — ABNORMAL LOW (ref >60.00)
Glucose, Bld: 109 mg/dL — ABNORMAL HIGH (ref 70–99)
Potassium: 3.9 mEq/L (ref 3.5–5.1)
Sodium: 138 mEq/L (ref 135–145)

## 2018-11-25 LAB — IBC PANEL
Iron: 56 ug/dL (ref 42–145)
Saturation Ratios: 11.4 % — ABNORMAL LOW (ref 20.0–50.0)
Transferrin: 352 mg/dL (ref 212.0–360.0)

## 2018-11-25 MED ORDER — TRAMADOL HCL 50 MG PO TABS: 100.0000 mg | ORAL_TABLET | Freq: Four times a day (QID) | ORAL | 5 refills | Status: DC | PRN

## 2018-11-25 NOTE — Patient Instructions (Signed)
Flank Pain, Adult Flank pain is pain that is located on the side of the body between the upper abdomen and the back. This area is called the flank. The pain may occur over a short period of time (acute), or it may be long-term or recurring (chronic). It may be mild or severe. Flank pain can be caused by many things, including:  Muscle soreness or injury.  Kidney stones or kidney disease.  Stress.  A disease of the spine (vertebral disk disease).  A lung infection (pneumonia).  Fluid around the lungs (pulmonary edema).  A skin rash caused by the chickenpox virus (shingles).  Tumors that affect the back of the abdomen.  Gallbladder disease. Follow these instructions at home:   Drink enough fluid to keep your urine clear or pale yellow.  Rest as told by your health care provider.  Take over-the-counter and prescription medicines only as told by your health care provider.  Keep a journal to track what has caused your flank pain and what has made it feel better.  Keep all follow-up visits as told by your health care provider. This is important. Contact a health care provider if:  Your pain is not controlled with medicine.  You have new symptoms.  Your pain gets worse.  You have a fever.  Your symptoms last longer than 2-3 days.  You have trouble urinating or you are urinating very frequently. Get help right away if:  You have trouble breathing or you are short of breath.  Your abdomen hurts or it is swollen or red.  You have nausea or vomiting.  You feel faint or you pass out.  You have blood in your urine. Summary  Flank pain is pain that is located on the side of the body between the upper abdomen and the back.  The pain may occur over a short period of time (acute), or it may be long-term or recurring (chronic). It may be mild or severe.  Flank pain can be caused by many things.  Contact your health care provider if your symptoms get worse or they last  longer than 2-3 days. This information is not intended to replace advice given to you by your health care provider. Make sure you discuss any questions you have with your health care provider. Document Released: 02/28/2005 Document Revised: 12/20/2016 Document Reviewed: 03/22/2016 Elsevier Patient Education  2020 [employer]  

## 2018-11-25 NOTE — Progress Notes (Signed)
Subjective:  Patient ID: Virginia Edwards, female    DOB: 12/15/1925  Age: 85 y.o. MRN: [id number]  CC: Annual Exam and Hypertension   HPI Virginia Edwards presents for a CPX.  She complains of a 1 month history of discomfort in both flanks, worse on the left than the right.  She makes a vague reference to a fall or near fall that occurred prior to the symptoms.  She also tells me that she has increased the dose of tramadol to 100 mg 4 times a day to control her pain.  Outpatient Medications Prior to Visit  Medication Sig Dispense Refill   EDARBI 80 MG TABS TAKE 1 TABLET BY MOUTH DAILY 90 tablet 1   Multiple Vitamins-Minerals (CENTRUM SILVER PO) Take by mouth.     VENTOLIN HFA 108 (90 Base) MCG/ACT inhaler INL 2 PFS ITL PO Q 4 TO 6 H PRF WHEEZING OR SOB  0   traMADol (ULTRAM) 50 MG tablet Take 1 tablet (50 mg total) by mouth every 6 (six) hours as needed. 90 tablet 0   b complex vitamins tablet Take 1 tablet by mouth daily.      Cholecalciferol (VITAMIN D3) 1000 units CAPS Take 2 capsules (2,000 Units total) by mouth daily. 180 capsule 3   mirabegron ER (MYRBETRIQ) 50 MG TB24 tablet Take 1 tablet (50 mg total) by mouth daily. (Patient not taking: Reported on 11/25/2018) 90 tablet 1   spironolactone (ALDACTONE) 25 MG tablet TAKE 1 TABLET(25 MG) BY MOUTH DAILY 90 tablet 0   No facility-administered medications prior to visit.     ROS Review of Systems  Constitutional: Negative.  Negative for chills, fatigue and fever.  HENT: Negative.   Eyes: Negative for visual disturbance.  Respiratory: Negative for cough and chest tightness.   Gastrointestinal: Positive for constipation. Negative for abdominal pain, diarrhea, nausea and vomiting.  Endocrine: Positive for polyuria. Negative for polydipsia and polyphagia.  Genitourinary: Positive for flank pain and frequency. Negative for difficulty urinating.  Musculoskeletal: Positive for arthralgias. Negative for back pain.  Skin: Negative.   Negative for color change, pallor and rash.  Neurological: Negative.  Negative for dizziness, light-headedness and headaches.  Hematological: Negative for adenopathy. Does not bruise/bleed easily.  Psychiatric/Behavioral: Negative.     Objective:  BP (!) 162/82 (BP Location: Left Arm, Patient Position: Sitting, Cuff Size: Normal)    Pulse 78    Temp 98.2 F (36.8 C) (Oral)    Resp 16    Ht 5\' 2"  (1.575 m)    Wt 148 lb 12 oz (67.5 kg)    SpO2 97%    BMI 27.21 kg/m   BP Readings from Last 3 Encounters:  11/25/18 (!) 162/82  03/06/18 140/72  02/26/18 (!) 149/64    Wt Readings from Last 3 Encounters:  11/25/18 148 lb 12 oz (67.5 kg)  03/06/18 142 lb 8 oz (64.6 kg)  02/24/18 131 lb 7 oz (59.6 kg)    Physical Exam Vitals signs reviewed.  Constitutional:      Appearance: She is obese.     Comments: Confined to a wheelchair  HENT:     Nose: Nose normal.     Mouth/Throat:     Mouth: Mucous membranes are moist.  Eyes:     General: No scleral icterus.    Conjunctiva/sclera: Conjunctivae normal.  Neck:     Musculoskeletal: Neck supple.  Cardiovascular:     Rate and Rhythm: Normal rate and regular rhythm.     Heart sounds:  No murmur.  Pulmonary:     Effort: Pulmonary effort is normal.     Breath sounds: No stridor. No wheezing, rhonchi or rales.  Abdominal:     General: Abdomen is protuberant. Bowel sounds are normal. There is no distension.     Palpations: Abdomen is soft. There is no hepatomegaly or splenomegaly.  Musculoskeletal: Normal range of motion.     Right lower leg: No edema.     Left lower leg: No edema.  Lymphadenopathy:     Cervical: No cervical adenopathy.  Skin:    General: Skin is warm and dry.  Neurological:     General: No focal deficit present.     Mental Status: Mental status is at baseline.  Psychiatric:        Mood and Affect: Mood normal.        Behavior: Behavior normal.     Lab Results  Component Value Date   WBC 11.6 (H) 02/25/2018   HGB  11.4 (L) 02/25/2018   HCT 38.6 02/25/2018   PLT 202 02/25/2018   GLUCOSE 127 (H) 02/25/2018   CHOL 158 07/01/2017   TRIG 87.0 07/01/2017   HDL 50.70 07/01/2017   LDLDIRECT 151.0 05/14/2016   LDLCALC 90 07/01/2017   ALT 18 02/25/2018   AST 25 02/25/2018   NA 136 02/25/2018   K 3.4 (L) 02/25/2018   CL 106 02/25/2018   CREATININE 0.74 02/25/2018   BUN 24 (H) 02/25/2018   CO2 23 02/25/2018   TSH 4.03 07/01/2017   INR 0.94 10/02/2011   HGBA1C 5.9 02/03/2018    Dg Chest 2 View  Result Date: 02/25/2018 CLINICAL DATA:  Status post fall EXAM: CHEST - 2 VIEW COMPARISON:  09/17/2011 FINDINGS: The heart size and mediastinal contours are within normal limits. Both lungs are clear. There is thoracic spine spondylosis. IMPRESSION: No active cardiopulmonary disease. Electronically Signed   By: Elige Ko   On: 02/25/2018 01:57   Dg Lumbar Spine Complete  Result Date: 02/25/2018 CLINICAL DATA:  Status post fall last night EXAM: LUMBAR SPINE - COMPLETE 4+ VIEW COMPARISON:  None. FINDINGS: There is no evidence of lumbar spine fracture. Levoscoliosis of the lumbar spine. Generalized osteopenia. Degenerative disc disease with disc height loss throughout the thoracolumbar spine. There is bilateral facet arthropathy of the lumbar spine most severe at L5-S1. There is abdominal aortic atherosclerosis. IMPRESSION: No acute osseous injury of the lumbar spine. Electronically Signed   By: Elige Ko   On: 02/25/2018 01:53   Ct Head Wo Contrast  Result Date: 02/25/2018 CLINICAL DATA:  Status post fall, confused EXAM: CT HEAD WITHOUT CONTRAST CT CERVICAL SPINE WITHOUT CONTRAST TECHNIQUE: Multidetector CT imaging of the head and cervical spine was performed following the standard protocol without intravenous contrast. Multiplanar CT image reconstructions of the cervical spine were also generated. COMPARISON:  08/02/2015 FINDINGS: CT HEAD FINDINGS Brain: No evidence of acute infarction, hemorrhage, extra-axial  collection, ventriculomegaly, or mass effect. Generalized cerebral atrophy. Periventricular white matter low attenuation likely secondary to microangiopathy. Vascular: Cerebrovascular atherosclerotic calcifications are noted. Skull: Negative for fracture or focal lesion. Sinuses/Orbits: Visualized portions of the orbits are unremarkable. Visualized portions of the paranasal sinuses and mastoid air cells are unremarkable. Other: None. CT CERVICAL SPINE FINDINGS Alignment: 3 mm anterolisthesis of C3 on C4. Skull base and vertebrae: No acute fracture. No primary bone lesion or focal pathologic process. Soft tissues and spinal canal: No prevertebral fluid or swelling. No visible canal hematoma. Disc levels: Degenerative disc disease with  disc height loss at C3-4, C4-5, C5-6 and C6-7. Severe bilateral facet arthropathy at C2-3. Broad-based disc osteophyte complex at C3-4 with bilateral uncovertebral degenerative changes, facet arthropathy and foraminal stenosis. At C4-5 there is a broad-based disc osteophyte complex, bilateral uncovertebral degenerative changes, severe bilateral foraminal stenosis, and mild bilateral facet arthropathy. At C5-6 there is a broad-based disc osteophyte complex with bilateral uncovertebral degenerative changes, moderate bilateral foraminal stenosis and bilateral facet arthropathy. At C6-7 there is a broad-based disc osteophyte complex with bilateral uncovertebral degenerative changes, severe left foraminal stenosis and mild right foraminal stenosis. Upper chest: Lung apices are clear. Other: No fluid collection or hematoma. Bilateral carotid artery atherosclerosis. IMPRESSION: 1. No acute intracranial pathology. 2.  No acute osseous injury of the cervical spine. 3. Cervical spine spondylosis as described above. Electronically Signed   By: Elige Ko   On: 02/25/2018 02:06   Ct Cervical Spine Wo Contrast  Result Date: 02/25/2018 CLINICAL DATA:  Status post fall, confused EXAM: CT HEAD  WITHOUT CONTRAST CT CERVICAL SPINE WITHOUT CONTRAST TECHNIQUE: Multidetector CT imaging of the head and cervical spine was performed following the standard protocol without intravenous contrast. Multiplanar CT image reconstructions of the cervical spine were also generated. COMPARISON:  08/02/2015 FINDINGS: CT HEAD FINDINGS Brain: No evidence of acute infarction, hemorrhage, extra-axial collection, ventriculomegaly, or mass effect. Generalized cerebral atrophy. Periventricular white matter low attenuation likely secondary to microangiopathy. Vascular: Cerebrovascular atherosclerotic calcifications are noted. Skull: Negative for fracture or focal lesion. Sinuses/Orbits: Visualized portions of the orbits are unremarkable. Visualized portions of the paranasal sinuses and mastoid air cells are unremarkable. Other: None. CT CERVICAL SPINE FINDINGS Alignment: 3 mm anterolisthesis of C3 on C4. Skull base and vertebrae: No acute fracture. No primary bone lesion or focal pathologic process. Soft tissues and spinal canal: No prevertebral fluid or swelling. No visible canal hematoma. Disc levels: Degenerative disc disease with disc height loss at C3-4, C4-5, C5-6 and C6-7. Severe bilateral facet arthropathy at C2-3. Broad-based disc osteophyte complex at C3-4 with bilateral uncovertebral degenerative changes, facet arthropathy and foraminal stenosis. At C4-5 there is a broad-based disc osteophyte complex, bilateral uncovertebral degenerative changes, severe bilateral foraminal stenosis, and mild bilateral facet arthropathy. At C5-6 there is a broad-based disc osteophyte complex with bilateral uncovertebral degenerative changes, moderate bilateral foraminal stenosis and bilateral facet arthropathy. At C6-7 there is a broad-based disc osteophyte complex with bilateral uncovertebral degenerative changes, severe left foraminal stenosis and mild right foraminal stenosis. Upper chest: Lung apices are clear. Other: No fluid  collection or hematoma. Bilateral carotid artery atherosclerosis. IMPRESSION: 1. No acute intracranial pathology. 2.  No acute osseous injury of the cervical spine. 3. Cervical spine spondylosis as described above. Electronically Signed   By: Elige Ko   On: 02/25/2018 02:06    Dg Abd Acute W/chest  Result Date: 11/26/2018 CLINICAL DATA:  Bilateral flank pain for 1 month. EXAM: DG ABDOMEN ACUTE W/ 1V CHEST COMPARISON:  Chest x-ray 02/25/2018 FINDINGS: The upright chest x-ray demonstrates stable cardiomediastinal contours. Mild tortuosity and calcification of the thoracic aorta. The lungs are clear. No worrisome pulmonary lesions. Two views of the abdomen demonstrate a large amount of stool throughout the colon and down into the rectosigmoid area suggesting constipation. No findings for small bowel obstruction or free air. The soft tissue shadows of the abdomen are grossly maintained. No worrisome calcifications. Advanced aortic calcifications are noted without definite aneurysm. Significant left convex lumbar scoliosis and associated degenerative lumbar spondylosis. Sclerotic lesion in the right iliac bone  is unchanged since 2017 and consistent with a benign bone island. IMPRESSION: 1. Large amount of stool throughout the colon suggesting constipation. No findings for small bowel obstruction or free air. 2. No acute cardiopulmonary findings. Electronically Signed   By: Marijo Sanes M.D.   On: 11/26/2018 08:41    Assessment & Plan:   Kea was seen today for annual exam and hypertension.  Diagnoses and all orders for this visit:  Coronary artery disease involving native coronary artery of native heart without angina pectoris- She has had no recent episodes of angina.  Will continue risk factor modifications. -     Lipid panel; Future  Essential hypertension, benign- Her blood pressure is adequately well controlled. -     Basic metabolic panel; Future  Hyperlipidemia with target LDL less than  100 -     TSH; Future  Routine general medical examination at a health care facility- Exam completed, labs reviewed, vaccines reviewed and updated, no cancer screenings are indicated, patient education was given to her caretaker.  Need for influenza vaccination -     Flu Vaccine QUAD High Dose(Fluad)  Hyperglycemia -     Basic metabolic panel; Future -     Hemoglobin A1c; Future  Urinary frequency -     Urinalysis, Routine w reflex microscopic; Future  Deficiency anemia-I will screen her for vitamin deficiencies. -     CBC with Differential; Future -     B12; Future -     Folate; Future -     Ferritin; Future -     Vitamin B1; Future -     IBC panel; Future  Bilateral flank pain- Plain films are remarkable only for stool retention.  I will check her labs to see if there are any secondary causes. -     DG Abd Acute W/Chest; Future  Primary osteoarthritis of both knees -     traMADol (ULTRAM) 50 MG tablet; Take 2 tablets (100 mg total) by mouth every 6 (six) hours as needed.  Chronic idiopathic constipation-I think this may be contributing to her discomfort.  I have asked her to take Amitiza. -     lubiprostone (AMITIZA) 24 MCG capsule; Take 1 capsule (24 mcg total) by mouth 2 (two) times daily with a meal.   I have discontinued Marcie Bal Pozo's b complex vitamins, Vitamin D3, spironolactone, and mirabegron ER. I have also changed her traMADol. Additionally, I am having her start on lubiprostone. Lastly, I am having her maintain her Ventolin HFA, Edarbi, and Multiple Vitamins-Minerals (CENTRUM SILVER PO).  Meds ordered this encounter  Medications   traMADol (ULTRAM) 50 MG tablet    Sig: Take 2 tablets (100 mg total) by mouth every 6 (six) hours as needed.    Dispense:  240 tablet    Refill:  5   lubiprostone (AMITIZA) 24 MCG capsule    Sig: Take 1 capsule (24 mcg total) by mouth 2 (two) times daily with a meal.    Dispense:  180 capsule    Refill:  1     Follow-up:  Return if symptoms worsen or fail to improve.  Scarlette Calico, MD

## 2018-11-26 ENCOUNTER — Encounter: Payer: Self-pay | Admitting: Internal Medicine

## 2018-11-26 DIAGNOSIS — K5904 Chronic idiopathic constipation: Secondary | ICD-10-CM | POA: Insufficient documentation

## 2018-11-26 LAB — HEMOGLOBIN A1C: Hgb A1c MFr Bld: 6 % (ref 4.6–6.5)

## 2018-11-26 MED ORDER — LUBIPROSTONE 24 MCG PO CAPS: 24.0000 ug | ORAL_CAPSULE | Freq: Two times a day (BID) | ORAL | 1 refills | Status: DC

## 2018-11-29 LAB — VITAMIN B1: Vitamin B1 (Thiamine): 27 nmol/L (ref 8–30)

## 2018-12-08 ENCOUNTER — Telehealth: Payer: Self-pay | Admitting: Internal Medicine

## 2018-12-08 ENCOUNTER — Other Ambulatory Visit: Payer: Self-pay | Admitting: Internal Medicine

## 2018-12-08 DIAGNOSIS — M170 Bilateral primary osteoarthritis of knee: Secondary | ICD-10-CM

## 2018-12-08 DIAGNOSIS — I2510 Atherosclerotic heart disease of native coronary artery without angina pectoris: Secondary | ICD-10-CM

## 2018-12-08 DIAGNOSIS — I10 Essential (primary) hypertension: Secondary | ICD-10-CM

## 2018-12-08 DIAGNOSIS — K5904 Chronic idiopathic constipation: Secondary | ICD-10-CM

## 2018-12-08 NOTE — Telephone Encounter (Signed)
Medication Refill - Medication: traMADol (ULTRAM) 50 MG tablet [[id number]]      Preferred Pharmacy (with phone number or street name):  [facility] [place], [address] [place]  [place] [facility] [place] [address]  Phone: [phone] Fax: [phone]     Agent: Please be advised that RX refills may take up to 3 business days. We ask that you follow-up with your pharmacy.

## 2018-12-08 NOTE — Telephone Encounter (Signed)
Copied from [place] [id number]. Topic: General - Other >> Dec 08, 2018 12:45 PM Keene Breath wrote: Reason for CRM: Patient called to inform the nurse that her prescriptions were misplaced and the patient would like another script.  Please advise and call to speak with the patient or daughter at [phone]

## 2018-12-08 NOTE — Telephone Encounter (Signed)
Requested medication (s) are due for refill today: no  Requested medication (s) are on the active medication list: yes  Last refill:  11/25/2018  Future visit scheduled: no  Notes to clinic:  Med not delegated. Looks like it was just refilled. I see note that patient has lost all her meds?    Requested Prescriptions  Pending Prescriptions Disp Refills   traMADol (ULTRAM) 50 MG tablet 240 tablet 5    Sig: Take 2 tablets (100 mg total) by mouth every 6 (six) hours as needed.     Not Delegated - Analgesics:  Opioid Agonists Failed - 12/08/2018  4:25 PM      Failed - This refill cannot be delegated      Passed - Urine Drug Screen completed in last 360 days.      Passed - Valid encounter within last 6 months    Recent Outpatient Visits          1 week ago Coronary artery disease involving native coronary artery of native heart without angina pectoris   [place], MD   4 months ago Erroneous encounter - disregard   [place], Elizabeth A, MD   9 months ago Essential hypertension, benign   [facility] -Mayer Camel, MD   10 months ago Essential hypertension, benign   [facility] -Mayer Camel, MD   1 year ago Essential hypertension, benign   [facility] -Mayer Camel, MD

## 2018-12-08 NOTE — Telephone Encounter (Signed)
Spoke to Hedley (pt dtr) and she stated that she has misplaced all of pts medication and doesn't remember how long ago it was since she picked up the prescriptions.   Informed Vinnie Level that she would have to contact the pharmacy and the pharmacy would have to submit a claim to the insurance company requesting them to pay for an early fill.

## 2018-12-09 ENCOUNTER — Other Ambulatory Visit: Payer: Self-pay | Admitting: Internal Medicine

## 2018-12-09 DIAGNOSIS — I2510 Atherosclerotic heart disease of native coronary artery without angina pectoris: Secondary | ICD-10-CM

## 2018-12-09 DIAGNOSIS — I10 Essential (primary) hypertension: Secondary | ICD-10-CM

## 2018-12-09 MED ORDER — LUBIPROSTONE 24 MCG PO CAPS: 24.0000 ug | ORAL_CAPSULE | Freq: Two times a day (BID) | ORAL | 1 refills | Status: DC

## 2018-12-09 MED ORDER — EDARBI 80 MG PO TABS: 1.0000 | ORAL_TABLET | Freq: Every day | ORAL | 1 refills | Status: DC

## 2018-12-09 MED ORDER — TRAMADOL HCL 50 MG PO TABS: 100.0000 mg | ORAL_TABLET | Freq: Four times a day (QID) | ORAL | 5 refills | Status: DC | PRN

## 2018-12-09 NOTE — Telephone Encounter (Signed)
lvm for Vinnie Level informing erx has been sent in.

## 2018-12-09 NOTE — Telephone Encounter (Signed)
Pt daughter has called back on these scripts and wanting refills states mother does not have her med and worried she will have a stroke

## 2018-12-09 NOTE — Telephone Encounter (Signed)
Virginia Edwards calling back.  States that she has spoken with pharmacy and they state they need a refill request from PCP before they can request that insurance company pay.

## 2018-12-21 DIAGNOSIS — Z743 Need for continuous supervision: Secondary | ICD-10-CM | POA: Diagnosis not present

## 2018-12-21 DIAGNOSIS — M545 Low back pain: Secondary | ICD-10-CM | POA: Diagnosis not present

## 2018-12-21 DIAGNOSIS — I10 Essential (primary) hypertension: Secondary | ICD-10-CM | POA: Diagnosis not present

## 2018-12-21 DIAGNOSIS — R52 Pain, unspecified: Secondary | ICD-10-CM | POA: Diagnosis not present

## 2018-12-21 DIAGNOSIS — W19XXXA Unspecified fall, initial encounter: Secondary | ICD-10-CM | POA: Diagnosis not present

## 2018-12-30 ENCOUNTER — Encounter: Payer: Self-pay | Admitting: Internal Medicine

## 2018-12-30 ENCOUNTER — Non-Acute Institutional Stay (SKILLED_NURSING_FACILITY): Payer: Medicare Other | Admitting: Internal Medicine

## 2018-12-30 DIAGNOSIS — I10 Essential (primary) hypertension: Secondary | ICD-10-CM

## 2018-12-30 DIAGNOSIS — M109 Gout, unspecified: Secondary | ICD-10-CM | POA: Diagnosis not present

## 2018-12-30 DIAGNOSIS — I2510 Atherosclerotic heart disease of native coronary artery without angina pectoris: Secondary | ICD-10-CM

## 2018-12-30 DIAGNOSIS — R748 Abnormal levels of other serum enzymes: Secondary | ICD-10-CM

## 2018-12-30 DIAGNOSIS — M170 Bilateral primary osteoarthritis of knee: Secondary | ICD-10-CM | POA: Diagnosis not present

## 2018-12-30 NOTE — Progress Notes (Signed)
This is an acute visit.  Level care skilled.  Facility is Doctor, hospital complaint-acute visit status post hospitalization for lower extremity pain with suspected gout flare.  History of present illness.  Patient is a 85 year old female with a history of hypertension as well as osteoarthritis as well as coronary artery disease and dementia who was admitted to the hospital on November 30 complaining of multiple falls and bilateral lower extremity pain-with unsteady gait.  CT of the head did not show any abnormalities x-rays of extremities did show severe osteoarthritis of the left knee-with multiple chronic loose bodies in the joint.  She also had an elevated CK concerning for rhabdomylolysis and was started on IV fluids.  She was seen by orthopedics who did a knee aspiration positive for monosodium urate crystals on both knees and calcium pyrophosphate crystals on the right knee.  She was started on colchicine and did complete a 3-day course with improvement of pain she is now on allopurinol.  Her CK level did trend down from 559 down to 224.  She did have a mildly elevated troponin that was flat EKG did not show any ST elevation.  She had an echo done that showed an ejection fraction of 65% with no wall motion abnormality.  Apparently hospitalization was complicated with delirium on and off and agitation she was seen by psychiatry.  She was given Haldol then placed on low-dose Seroquel with resolution of the delirium. Pain She is here essentially for rehab.  Currently she is sitting in her chair comfortably she is pleasant and cooperative although quite confused.  Vital signs appear to be stable.  Past Medical History:  Diagnosis Date  . Arthritis   . CAD (coronary artery disease)   . Cataracts, bilateral   . Chronic back pain   . Dementia ([place])   . Eczema   . Hyperlipidemia   . Hypertension    primary Dr. Scarlette Calico [phone], saw Dr. Stanford Breed x 1  cardio  . Osteoporosis   . Sciatica          Past Surgical History:  Procedure Laterality Date  . ABDOMINAL HYSTERECTOMY    . FRACTURE SURGERY     shoulder surgery 2008  . LIPOMA EXCISION     2011  . TOTAL KNEE ARTHROPLASTY  09/27/2011   Procedure: TOTAL KNEE ARTHROPLASTY;  Surgeon: Yvette Rack., MD;  Location: [facility];  Service: Orthopedics;  Laterality: Right;  right total knee arthroplasty         Family History  Problem Relation Age of Onset  . Lung cancer Father   . Heart Problems Brother   . Heart attack Brother   . Hypertension Sister     Social History       Tobacco Use  . Smoking status: Never Smoker  . Smokeless tobacco: Never Used  Substance Use Topics  . Alcohol use: No  . Drug use: No   Medications.  Tylenol 1000 mg p.o. every 8 hours.  Allopurinol 100 mg daily p.o.  Norvasc 5 mg p.o. daily.  Aspirin 81 mg enteric-coated daily.  Dulcolax 2 tablets Cipro 10 mg daily as needed constipation.  Cozaar 50 mg p.o. daily.  Miconazole 2% cream topically twice daily to affected areas.  Seroquel 12.5 mg nightly.  Review of systems.  This is limited secondary to dementia but she is not at this point complaining of pain she denies shortness of breath or chest pain.  Nursing does not report any complaints  other than that patient is confused.  Physical exam.  Temperature is 98.2 pulse 82 respirations 18 blood pressure 140/77 oxygen saturation is 97% on room air.  General this is a pleasant elderly female in no distress she is confused but pleasantly so.  Her skin is warm and dry.  Eyes visual acuity appears to be intact sclera and conjunctive are clear.  Oropharynx is clear mucous membranes moist.  Chest is clear to auscultation there is no labored breathing.  Heart is regular rate and rhythm without murmur gallop or rub she does not have significant lower extremity edema except some edema I suspect is somewhat chronic of her  knees bilaterally.  Abdomen is soft nontender with positive bowel sounds.  Musculoskeletal she does move all extremities x4 it appears at relative baseline does not really exhibit pain with motion of her extremities although appears to have possibly some mild tenderness to palpation of her knees bilaterally-the edema is not really significantly warm to touch it is flesh-colored.  Neurologic appears grossly intact her speech is clear although she is confused cannot really appreciate lateralizing findings.  Psych she is oriented to self she can tell me her birthdate full name-- she follows simple verbal commands at times.  Labs.  December 29, 2018.  WBC 7.4 hemoglobin 9.8 platelets 231.  Sodium 144 potassium 4.3 BUN 16 creatinine 0.61.  Albumin was 3.0-AST was 43 otherwise liver function tests within normal limits.  Assessment and plan.  1.  Acute gout of knees-she did complete a course of colchicine and is now on allopurinol 100 mg daily this appears to be helping at this point will monitor she does have orders for Tylenol to help with the pain.  2.  Hypertension at this point will monitor she is on Norvasc 5 mg a day and Cozaar 50 mg a day systolic blood pressure today was 140/90 appears was 166 at this point will monitor since we do not have very many readings.  3.  History of severe osteoarthritis she is on Tylenol 1000 mg 3 times daily she cannot really receive any more Tylenol since she is getting 3 g a day at this point appears to be stable.  4.  History of elevated creatinine kinase-this was trending down in the hospital will try to have this rechecked early next week.  5.  History of delirium with agitation-she did receive Haldol and then low-dose Seroquel the hospital-Seroquel has been discontinued for now and will monitor at this point she is pleasant cooperative confused but not agitated.  6.  History of coronary artery disease she continues on aspirin 81 mg a day.  7.   History of constipation she continues on Dulcolax as needed.  Again will update a CBC and BMP on December 14 and also will try to obtain an updated CK level.  Clinically she appears to be stable although confused.  Of note apparently overnight she did have a fall with no apparent injuries she is not complaining of any pain this morning-nursing staff is monitoring for any changes-apparently she told staff she just lowered herself to the floor  [id number]--of note greater than 40 minutes spent assessing patient-reviewing her chart and labs-discussing her status with nursing-and coordinating and formulating a plan of care for numerous diagnoses-of note greater than 50% of time spent coordinating a plan of care with input as noted above

## 2019-01-01 ENCOUNTER — Encounter: Payer: Self-pay | Admitting: Internal Medicine

## 2019-01-01 ENCOUNTER — Non-Acute Institutional Stay (SKILLED_NURSING_FACILITY): Payer: Medicare Other | Admitting: Internal Medicine

## 2019-01-01 DIAGNOSIS — F0281 Dementia in other diseases classified elsewhere with behavioral disturbance: Secondary | ICD-10-CM

## 2019-01-01 DIAGNOSIS — M11261 Other chondrocalcinosis, right knee: Secondary | ICD-10-CM | POA: Diagnosis not present

## 2019-01-01 DIAGNOSIS — R410 Disorientation, unspecified: Secondary | ICD-10-CM

## 2019-01-01 DIAGNOSIS — G301 Alzheimer's disease with late onset: Secondary | ICD-10-CM

## 2019-01-01 DIAGNOSIS — I10 Essential (primary) hypertension: Secondary | ICD-10-CM | POA: Diagnosis not present

## 2019-01-01 DIAGNOSIS — M1A0620 Idiopathic chronic gout, left knee, without tophus (tophi): Secondary | ICD-10-CM

## 2019-01-01 DIAGNOSIS — K5904 Chronic idiopathic constipation: Secondary | ICD-10-CM

## 2019-01-01 NOTE — Progress Notes (Signed)
: Provider:  Hennie Duos., MD Location:  [place] Room Number: 161-W Place of Service:  SNF ([phone])  PCP: Janith Lima, MD Patient Care Team: Janith Lima, MD as PCP - General (Internal Medicine) Johnathan Hausen, MD (General Surgery) Stanford Breed Denice Bors, MD (Cardiology) Earlie Server, MD (Orthopedic Surgery)  Extended Emergency Contact Information Primary Emergency Contact: [place], [place] [address] Johnnette Litter of [place] Phone: [phone] Relation: Daughter     Allergies: Alendronate sodium, Amlodipine, Hydrocodone-acetaminophen, and Shellfish allergy  Chief Complaint  Patient presents with  . New Admit To SNF    New admission to [facility] SNF     HPI: Patient is 85 y.o. female with hypertension osteoarthritis who presented to [facility] with complaints of multiple falls and bilateral lower extremity pain and imaging inability to ambulate, and unsteady gait.  CT of the head showed no acute abnormalities.  X-ray of the extremity shows severe arthritis of the left knee with multiple chronic loose bodies in the joint.  Patient was found to have an elevated CK concern for rhabdo was started on IV fluids.  Patient is admitted to [facility] from 11/30-12/8 where she was seen by with thick surgery who performed bilateral knee aspiration which was positive for monosodium urate crystals in both knees and calcium-phosphate crystals in the right knee.  Patient was started on colchicine which she completed after 3 days with improvement in her pain and her CK trended down.  During the course of the hospitalization patient had delirium on and off and agitation and was seen by a psychiatrist to initially gave her Haldol and then placed her on low-dose Seroquel with resolution of her delirium.  Patient was admitted to skilled nursing facility for OT/PT.  While at skilled nursing facility patient will be  followed forHypertension treated with Norvasc and Cozaar, constipation treated with Dulcolax and dementia treated with supportive care..  Past Medical History:  Diagnosis Date  . Arthritis   . CAD (coronary artery disease)   . Cataracts, bilateral   . Chronic back pain   . Dementia ([place])   . Eczema   . Hyperlipidemia   . Hypertension    primary Dr. Scarlette Calico [phone], saw Dr. Stanford Breed x 1 cardio  . Osteoporosis   . Sciatica     Past Surgical History:  Procedure Laterality Date  . ABDOMINAL HYSTERECTOMY    . FRACTURE SURGERY     shoulder surgery 2008  . LIPOMA EXCISION     2011  . TOTAL KNEE ARTHROPLASTY  09/27/2011   Procedure: TOTAL KNEE ARTHROPLASTY;  Surgeon: Yvette Rack., MD;  Location: [place];  Service: Orthopedics;  Laterality: Right;  right total knee arthroplasty    Allergies as of 01/01/2019      Reactions   Alendronate Sodium Anaphylaxis   Throat swells   Amlodipine Swelling   Leg edema   Hydrocodone-acetaminophen Other (See Comments)   hallicinations   Shellfish Allergy Other (See Comments)   Blister on leg      Medication List       Accurate as of January 01, 2019 10:43 AM. If you have any questions, ask your nurse or doctor.        acetaminophen 500 MG tablet Commonly known as: TYLENOL Take 1,000 mg by mouth every 8 (eight) hours.   allopurinol 100 MG tablet Commonly known as: ZYLOPRIM Take  100 mg by mouth daily.   amLODipine 5 MG tablet Commonly known as: NORVASC Take 5 mg by mouth daily.   aspirin 81 MG chewable tablet Chew 81 mg by mouth daily.   bisacodyl 5 MG EC tablet Commonly known as: DULCOLAX Take 10 mg by mouth daily as needed for moderate constipation.   losartan 50 MG tablet Commonly known as: COZAAR Take 50 mg by mouth daily.   magnesium hydroxide 400 MG/5ML suspension Commonly known as: MILK OF MAGNESIA Take 30 mLs by mouth daily as needed for mild constipation.   miconazole 2 % cream Commonly known as:  MICOTIN Apply 1 application topically 2 (two) times daily. Apply under bilateral breasts   NUTRITIONAL SUPPLEMENT PO Take 1 each by mouth 2 (two) times daily. Magic Cup with lunch and dinner       No orders of the defined types were placed in this encounter.   Immunization History  Administered Date(s) Administered  . Fluad Quad(high Dose 65+) 11/25/2018  . Influenza Split 11/23/2010, 10/23/2011  . Influenza, High Dose Seasonal PF 09/22/2014, 02/03/2018  . Influenza,inj,Quad PF,6+ Mos 10/09/2012, 12/23/2013  . Pneumococcal Conjugate-13 04/14/2014  . Pneumococcal Polysaccharide-23 05/17/2006, 02/09/2015  . Tdap 05/17/2010  . Zoster 08/13/2012    Social History   Tobacco Use  . Smoking status: Never Smoker  . Smokeless tobacco: Never Used  Substance Use Topics  . Alcohol use: No    Family history is   Family History  Problem Relation Age of Onset  . Lung cancer Father   . Heart Problems Brother   . Heart attack Brother   . Hypertension Sister       Review of Systems   GENERAL:  no fevers, fatigue, appetite changes SKIN: No itching, or rash EYES: No eye pain, redness, discharge EARS: No earache, tinnitus, change in hearing NOSE: No congestion, drainage or bleeding  MOUTH/THROAT: No mouth or tooth pain, No sore throat RESPIRATORY: No cough, wheezing, SOB CARDIAC: No chest pain, palpitations, lower extremity edema  GI: No abdominal pain, No N/V/D or constipation, No heartburn or reflux  GU: No dysuria, frequency or urgency, or incontinence  MUSCULOSKELETAL: Knee pain is better NEUROLOGIC: No headache, dizziness or focal weakness PSYCHIATRIC: No c/o anxiety or sadness   Vitals:   01/01/19 1037  BP: 108/78  Pulse: 76  Resp: 19  Temp: (!) 97.1 F (36.2 C)  SpO2: 97%    SpO2 Readings from Last 1 Encounters:  01/01/19 97%   Body mass index is 26.63 kg/m.     Physical Exam  GENERAL APPEARANCE: Alert,  No acute distress.  SKIN: No diaphoresis  rash HEAD: Normocephalic, atraumatic  EYES: Conjunctiva/lids clear. Pupils round, reactive. EOMs intact.  EARS: External exam WNL, canals clear. Hearing grossly normal.  NOSE: No deformity or discharge.  MOUTH/THROAT: Lips w/o lesions  RESPIRATORY: Breathing is even, unlabored. Lung sounds are clear   CARDIOVASCULAR: Heart RRR no murmurs, rubs or gallops.1  Trace-+ peripheral edema.   GASTROINTESTINAL: Abdomen is soft, non-tender, not distended w/ normal bowel sounds. GENITOURINARY: Bladder non tender, not distended  MUSCULOSKELETAL: No abnormal joints or musculature NEUROLOGIC:  Cranial nerves 2-12 grossly intact. Moves all extremities  PSYCHIATRIC: Mood and affect with dementia, no behavioral issues  Patient Active Problem List   Diagnosis Date Noted  . Chronic idiopathic constipation 11/26/2018  . Routine general medical examination at a health care facility 11/25/2018  . Urinary frequency 02/03/2018  . Encounter for long-term opiate analgesic use 02/03/2018  . Deficiency  anemia 05/14/2016  . OAB (overactive bladder) 05/14/2016  . Senile dementia uncomp (HCC) 08/14/2015  . Insomnia 04/22/2013  . CAD (coronary artery disease) 10/02/2011  . Hyperglycemia 02/22/2011  . Other screening mammogram 08/16/2010  . 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 Essential hypertension, benign 05/17/2010  . Hyperlipidemia with target LDL less than 100 05/17/2010      Labs reviewed: Basic Metabolic Panel:    Component Value Date/Time   NA 138 11/25/2018 1612   K 3.9 11/25/2018 1612   CL 104 11/25/2018 1612   CO2 28 11/25/2018 1612   GLUCOSE 109 (H) 11/25/2018 1612   BUN 25 (H) 11/25/2018 1612   CREATININE 1.00 11/25/2018 1612   CREATININE 0.93 10/02/2011 1107   CALCIUM 9.6 11/25/2018 1612   PROT 7.4 02/25/2018 0027   ALBUMIN 3.9 02/25/2018 0027   AST 25 02/25/2018 0027   ALT 18 02/25/2018 0027   ALKPHOS 73 02/25/2018 0027   BILITOT 0.9 02/25/2018 0027   GFRNONAA >60  02/25/2018 0027   GFRNONAA 57 (L) 10/02/2011 1107   GFRAA >60 02/25/2018 0027   GFRAA 65 10/02/2011 1107    Recent Labs    02/03/18 1215 02/25/18 0027 11/25/18 1612  NA 141 136 138  K 4.0 3.4* 3.9  CL 108 106 104  CO2 26 23 28   GLUCOSE 99 127* 109*  BUN 22 24* 25*  CREATININE 0.85 0.74 1.00  CALCIUM 9.8 9.7 9.6   Liver Function Tests: Recent Labs    02/25/18 0027  AST 25  ALT 18  ALKPHOS 73  BILITOT 0.9  PROT 7.4  ALBUMIN 3.9   No results for input(s): LIPASE, AMYLASE in the last 8760 hours. No results for input(s): AMMONIA in the last 8760 hours. CBC: Recent Labs    02/03/18 1215 02/25/18 0027 11/25/18 1612  WBC 7.2 11.6* 8.7  NEUTROABS 5.0 9.0* 5.4  HGB 11.0* 11.4* 11.1*  HCT 33.9* 38.6 34.6*  MCV 87.4 91.0 86.4  PLT 185.0 202 249.0   Lipid Recent Labs    11/25/18 1612  CHOL 257*  HDL 59.30  LDLCALC 177*  TRIG 103.0    Cardiac Enzymes: No results for input(s): CKTOTAL, CKMB, CKMBINDEX, TROPONINI in the last 8760 hours. BNP: No results for input(s): BNP in the last 8760 hours. No results found for: [facility] Lab Results  Component Value Date   HGBA1C 6.0 11/25/2018   Lab Results  Component Value Date   TSH 4.50 11/25/2018   Lab Results  Component Value Date   VITAMINB12 348 11/25/2018   Lab Results  Component Value Date   FOLATE >24.1 11/25/2018   Lab Results  Component Value Date   IRON 56 11/25/2018   FERRITIN 9.9 (L) 11/25/2018    Imaging and Procedures obtained prior to SNF admission: DG Abd Acute W/Chest  Result Date: 11/26/2018 CLINICAL DATA:  Bilateral flank pain for 1 month. EXAM: DG ABDOMEN ACUTE W/ 1V CHEST COMPARISON:  Chest x-ray 02/25/2018 FINDINGS: The upright chest x-ray demonstrates stable cardiomediastinal contours. Mild tortuosity and calcification of the thoracic aorta. The lungs are clear. No worrisome pulmonary lesions. Two views of the abdomen demonstrate a large amount of stool throughout the colon and down  into the rectosigmoid area suggesting constipation. No findings for small bowel obstruction or free air. The soft tissue shadows of the abdomen are grossly maintained. No worrisome calcifications. Advanced aortic calcifications are noted without definite aneurysm. Significant left convex lumbar scoliosis and associated degenerative lumbar spondylosis. Sclerotic lesion in the right  iliac bone is unchanged since 2017 and consistent with a benign bone island. IMPRESSION: 1. Large amount of stool throughout the colon suggesting constipation. No findings for small bowel obstruction or free air. 2. No acute cardiopulmonary findings. Electronically Signed   By: Rudie MeyerP.  Gallerani M.D.   On: 11/26/2018 08:41     Not all labs, radiology exams or other studies done during hospitalization come through on my EPIC note; however they are reviewed by me.    Assessment and Plan  Gout bilateral knees/pseudogout right knee-treated with colchicine for 3 days with improvement in her pain; orthopedic surgery aspirated bilateral knees revealing monosodium urate crystals in both knees and calcium pyrophosphate crystals in the right knee SNF-admitted for OT/PT; continue allopurinol 100 mg daily and Tylenol 1000 mg every 8 hours for pain  Delirium/agitatio-seen by psychiatrist who started her on Haldol and then placed on low-dose Seroquel with resolution of delirium SNF-patient has no indication to be on Seroquel based on hospital notes therefore will the tapering her Seroquel starting with 50 mg for a week then 25 mg for a week then off  Hypertension SNF-controlled; continue Norvasc 5 mg daily and Cozaar 50 mg daily  Dementia- SNF-not on any medications constipation continue supportive care  Constipation SNF-continue Dulcolax   Time spent greater than 45 minutes;> 50% of time with patient was spent reviewing records, labs, tests and studies, counseling and developing plan of care  Margit HanksAnne D Garnette Greb, MD

## 2019-01-06 ENCOUNTER — Encounter: Payer: Self-pay | Admitting: Internal Medicine

## 2019-01-06 DIAGNOSIS — F0391 Unspecified dementia with behavioral disturbance: Secondary | ICD-10-CM | POA: Insufficient documentation

## 2019-01-06 DIAGNOSIS — R410 Disorientation, unspecified: Secondary | ICD-10-CM | POA: Insufficient documentation

## 2019-01-06 DIAGNOSIS — M109 Gout, unspecified: Secondary | ICD-10-CM | POA: Insufficient documentation

## 2019-01-06 DIAGNOSIS — M11261 Other chondrocalcinosis, right knee: Secondary | ICD-10-CM | POA: Insufficient documentation

## 2019-01-06 DIAGNOSIS — F03918 Unspecified dementia, unspecified severity, with other behavioral disturbance: Secondary | ICD-10-CM | POA: Insufficient documentation

## 2019-01-19 ENCOUNTER — Non-Acute Institutional Stay (SKILLED_NURSING_FACILITY): Payer: Medicare Other | Admitting: Internal Medicine

## 2019-01-19 ENCOUNTER — Other Ambulatory Visit: Payer: Self-pay | Admitting: Internal Medicine

## 2019-01-19 DIAGNOSIS — G301 Alzheimer's disease with late onset: Secondary | ICD-10-CM

## 2019-01-19 DIAGNOSIS — R410 Disorientation, unspecified: Secondary | ICD-10-CM | POA: Diagnosis not present

## 2019-01-19 DIAGNOSIS — I10 Essential (primary) hypertension: Secondary | ICD-10-CM

## 2019-01-19 DIAGNOSIS — K5904 Chronic idiopathic constipation: Secondary | ICD-10-CM

## 2019-01-19 DIAGNOSIS — F02818 Dementia in other diseases classified elsewhere, unspecified severity, with other behavioral disturbance: Secondary | ICD-10-CM

## 2019-01-19 DIAGNOSIS — M1A0620 Idiopathic chronic gout, left knee, without tophus (tophi): Secondary | ICD-10-CM

## 2019-01-19 DIAGNOSIS — M11261 Other chondrocalcinosis, right knee: Secondary | ICD-10-CM | POA: Diagnosis not present

## 2019-01-19 DIAGNOSIS — F0281 Dementia in other diseases classified elsewhere with behavioral disturbance: Secondary | ICD-10-CM

## 2019-01-19 MED ORDER — AMLODIPINE BESYLATE 5 MG PO TABS: 5.0000 mg | ORAL_TABLET | Freq: Every day | ORAL | 0 refills | Status: DC

## 2019-01-19 MED ORDER — LOSARTAN POTASSIUM 50 MG PO TABS: 50.0000 mg | ORAL_TABLET | Freq: Every day | ORAL | 0 refills | Status: DC

## 2019-01-19 MED ORDER — ALLOPURINOL 100 MG PO TABS: 100.0000 mg | ORAL_TABLET | Freq: Every day | ORAL | 0 refills | Status: AC

## 2019-01-19 NOTE — Progress Notes (Signed)
Location:   Technical sales engineer of Service:   SNF Provider:[name] Milford Cage MD   PCP: Etta Grandchild, MD Patient Care Team: Etta Grandchild, MD as PCP - General (Internal Medicine) Luretha Murphy, MD (General Surgery) Jens Som Madolyn Frieze, MD (Cardiology) Frederico Hamman, MD (Orthopedic Surgery)  Extended Emergency Contact Information Primary Emergency Contact: [name], [place] [address] Darden Amber of [place] Home Phone: [phone] Relation: Daughter  Allergies  Allergen Reactions  . Alendronate Sodium Anaphylaxis    Throat swells  . Amlodipine Swelling    Leg edema  . Hydrocodone-Acetaminophen Other (See Comments)    hallicinations  . Shellfish Allergy Other (See Comments)    Blister on leg    Chief Complaint  Patient presents with  . Discharge Note    HPI:  85 y.o. female with hypertension, osteoarthritis who presented to [facility] with complaints of multiple falls and bilateral lower extremity pain, inability to ambulate and unsteady gait.  CT of the head showed no acute abnormalities.  X-ray of the extremity shows severe arthritis of the left knee with multiple chronic loose bodies in the joint.  Patient was found to have an elevated CK concerning for rhabdo and was started on IV fluids.  Patient was admitted to [facility] from 11/30-12/8 where she was seen by orthopedic surgery who performed bilateral knee aspiration with a positive for monosodium urate crystals in both knees and calcium pyrophosphate crystals in the right knee.  Patient was started on colchicine and completed a 3-day course with improvement, and her CK trended down.  During the course of the hospitalization patient had delirium on and off and agitation, was seen by psychiatry who initially gave her Haldol then placed her on low-dose Seroquel with resolution of her delirium.  Patient was admitted to skilled nursing facility for OT/PT.  During her time at  SNF she was weaned off her Seroquel.  Past Medical History:  Diagnosis Date  . Arthritis   . CAD (coronary artery disease)   . Cataracts, bilateral   . Chronic back pain   . Dementia (HCC)   . Eczema   . Hyperlipidemia   . Hypertension    primary Dr. Sanda Linger [phone], saw Dr. Jens Som x 1 cardio  . Osteoporosis   . Sciatica     Past Surgical History:  Procedure Laterality Date  . ABDOMINAL HYSTERECTOMY    . FRACTURE SURGERY     shoulder surgery 2008  . LIPOMA EXCISION     2011  . TOTAL KNEE ARTHROPLASTY  09/27/2011   Procedure: TOTAL KNEE ARTHROPLASTY;  Surgeon: Thera Flake., MD;  Location: [facility] OR;  Service: Orthopedics;  Laterality: Right;  right total knee arthroplasty     reports that she has never smoked. She has never used smokeless tobacco. She reports that she does not drink alcohol or use drugs. Social History   Socioeconomic History  . Marital status: Widowed    Spouse name: Not on file  . Number of children: Not on file  . Years of education: Not on file  . Highest education level: Not on file  Occupational History  . Not on file  Tobacco Use  . Smoking status: Never Smoker  . Smokeless tobacco: Never Used  Substance and Sexual Activity  . Alcohol use: No  . Drug use: No  . Sexual activity: Not Currently  Other Topics Concern  . Not on  file  Social History Narrative   Caffine drinks-Yes   Seat belt use-Yes   Regular exercise-Yes   Smoke alarm in home-Yes   Guns/firearms in home-NO   History of physical abuse-NO   Social Determinants of Health   Financial Resource Strain:   . Difficulty of Paying Living Expenses: Not on file  Food Insecurity:   . Worried About Charity fundraiser in the Last Year: Not on file  . Ran Out of Food in the Last Year: Not on file  Transportation Needs:   . Lack of Transportation (Medical): Not on file  . Lack of Transportation (Non-Medical): Not on file  Physical Activity:   . Days of Exercise per Week:  Not on file  . Minutes of Exercise per Session: Not on file  Stress:   . Feeling of Stress : Not on file  Social Connections:   . Frequency of Communication with Friends and Family: Not on file  . Frequency of Social Gatherings with Friends and Family: Not on file  . Attends Religious Services: Not on file  . Active Member of Clubs or Organizations: Not on file  . Attends Archivist Meetings: Not on file  . Marital Status: Not on file  Intimate Partner Violence:   . Fear of Current or Ex-Partner: Not on file  . Emotionally Abused: Not on file  . Physically Abused: Not on file  . Sexually Abused: Not on file    Pertinent  Health Maintenance Due  Topic Date Due  . INFLUENZA VACCINE  Completed  . DEXA SCAN  Completed  . PNA vac Low Risk Adult  Completed    Medications: Allergies as of 01/19/2019      Reactions   Alendronate Sodium Anaphylaxis   Throat swells   Amlodipine Swelling   Leg edema   Hydrocodone-acetaminophen Other (See Comments)   hallicinations   Shellfish Allergy Other (See Comments)   Blister on leg      Medication List       Accurate as of January 19, 2019 11:59 PM. If you have any questions, ask your nurse or doctor.        STOP taking these medications   magnesium hydroxide 400 MG/5ML suspension Commonly known as: MILK OF MAGNESIA Stopped by: Inocencio Homes, MD   miconazole 2 % cream Commonly known as: MICOTIN Stopped by: Inocencio Homes, MD   NUTRITIONAL SUPPLEMENT PO Stopped by: Inocencio Homes, MD     TAKE these medications   acetaminophen 500 MG tablet Commonly known as: TYLENOL Take 1,000 mg by mouth every 8 (eight) hours.   allopurinol 100 MG tablet Commonly known as: ZYLOPRIM Take 1 tablet (100 mg total) by mouth daily.   amLODipine 5 MG tablet Commonly known as: NORVASC Take 1 tablet (5 mg total) by mouth daily.   aspirin 81 MG chewable tablet Chew 81 mg by mouth daily.   bisacodyl 5 MG EC tablet Commonly known  as: DULCOLAX Take 10 mg by mouth daily as needed for moderate constipation.   losartan 50 MG tablet Commonly known as: COZAAR Take 1 tablet (50 mg total) by mouth daily.   promethazine 25 MG tablet Commonly known as: PHENERGAN Take 25 mg by mouth every 6 (six) hours as needed for nausea or vomiting.        Vitals:   01/22/19 1418  BP: 108/78  Pulse: 76  Resp: 19  Temp: (!) 97 F (36.1 C)  SpO2: 97%   There is no height  or weight on file to calculate BMI.  Physical Exam  GENERAL APPEARANCE: Alert, conversant. No acute distress.  HEENT: Unremarkable. RESPIRATORY: Breathing is even, unlabored. Lung sounds are clear   CARDIOVASCULAR: Heart RRR no murmurs, rubs or gallops. No peripheral edema.  GASTROINTESTINAL: Abdomen is soft, non-tender, not distended w/ normal bowel sounds.  NEUROLOGIC: Cranial nerves 2-12 grossly intact. Moves all extremities   Labs reviewed: Basic Metabolic Panel: Recent Labs    02/03/18 1215 02/25/18 0027 11/25/18 1612  NA 141 136 138  K 4.0 3.4* 3.9  CL 108 106 104  CO2 26 23 28   GLUCOSE 99 127* 109*  BUN 22 24* 25*  CREATININE 0.85 0.74 1.00  CALCIUM 9.8 9.7 9.6   No results found for: [facility] Liver Function Tests: Recent Labs    02/25/18 0027  AST 25  ALT 18  ALKPHOS 73  BILITOT 0.9  PROT 7.4  ALBUMIN 3.9   No results for input(s): LIPASE, AMYLASE in the last 8760 hours. No results for input(s): AMMONIA in the last 8760 hours. CBC: Recent Labs    02/03/18 1215 02/25/18 0027 11/25/18 1612  WBC 7.2 11.6* 8.7  NEUTROABS 5.0 9.0* 5.4  HGB 11.0* 11.4* 11.1*  HCT 33.9* 38.6 34.6*  MCV 87.4 91.0 86.4  PLT 185.0 202 249.0   Lipid Recent Labs    11/25/18 1612  CHOL 257*  HDL 59.30  LDLCALC 177*  TRIG 103.0   Cardiac Enzymes: No results for input(s): CKTOTAL, CKMB, CKMBINDEX, TROPONINI in the last 8760 hours. BNP: No results for input(s): BNP in the last 8760 hours. CBG: No results for input(s): GLUCAP in the  last 8760 hours.  Procedures and Imaging Studies During Stay: No results found.  Assessment/Plan:   Idiopathic chronic gout of left knee without tophus  Pseudogout of knee, right  Delirium  Essential hypertension, benign  Late onset Alzheimer's disease with behavioral disturbance (HCC)  Chronic idiopathic constipation   Patient is being discharged with the following home health services: OT/PT/aide  Patient is being discharged with the following durable medical equipment: None  Patient has been advised to f/u with their PCP in 1-2 weeks to bring them up to date on their rehab stay.  Social services at facility was responsible for arranging this appointment.  Pt was provided with a 30 day supply of prescriptions for medications and refills must be obtained from their PCP.  For controlled substances, a more limited supply may be provided adequate until PCP appointment only.   Time spent: 30 minutes;> 50% of time with patient was spent reviewing records, labs, tests and studies, counseling and developing plan of care  Virginia Edwards Meditz MD

## 2019-01-22 ENCOUNTER — Encounter: Payer: Self-pay | Admitting: Internal Medicine

## 2019-01-25 ENCOUNTER — Ambulatory Visit: Payer: Medicare Other | Admitting: Internal Medicine

## 2019-02-25 ENCOUNTER — Ambulatory Visit: Payer: Medicare Other | Admitting: Internal Medicine

## 2019-03-02 ENCOUNTER — Ambulatory Visit: Payer: Medicare Other | Admitting: Internal Medicine

## 2019-03-05 ENCOUNTER — Telehealth: Payer: Self-pay | Admitting: Family

## 2019-03-05 ENCOUNTER — Ambulatory Visit (INDEPENDENT_AMBULATORY_CARE_PROVIDER_SITE_OTHER): Payer: Medicare Other | Admitting: Family

## 2019-03-05 DIAGNOSIS — I10 Essential (primary) hypertension: Secondary | ICD-10-CM

## 2019-03-05 NOTE — Progress Notes (Signed)
Patient did not show for appointment.   

## 2019-03-05 NOTE — Telephone Encounter (Signed)
FYI for you and Dr. Yetta Barre- The patient's daughter called earlier this week asking for a DOXY appointment to go over medication changes made for patient from recent nursing home stay.  Unfortunately, they no-showed the appointment and we could not reach them at any of the numbers provided. I am going to ask them to see him in follow-up if they try to re-schedule a DOXY with me.

## 2019-03-05 NOTE — Telephone Encounter (Signed)
Agreed -

## 2019-03-14 ENCOUNTER — Telehealth: Payer: Self-pay | Admitting: Family Medicine

## 2019-03-14 MED ORDER — ALBUTEROL SULFATE HFA 108 (90 BASE) MCG/ACT IN AERS: 2.0000 | INHALATION_SPRAY | Freq: Four times a day (QID) | RESPIRATORY_TRACT | 0 refills | Status: DC | PRN

## 2019-03-14 NOTE — Telephone Encounter (Signed)
Received call from on call nurse that pt has been wheezing for a week, no fever or urinary sx, continues to eat/ drink/ urinate normally.  Her daughter is calling on her behalf She did have a cough but this is improved on mucinex.  Thy have not noted a fever She has complained of body aches since last Wednesday or Thursday She did not get tested for covid 19 despite sx last week, also had a virtual visit scheduled but forgot so she was not seen  I recommend having her seen at the ER today.  If they are unwilling to do this, please call Dr Yetta Barre office first thing tomorrow to arrange a virtual visit.  Explained that she will not be able to be seen in person by Dr Yetta Barre due to possible covid sx  I will call in albuterol that can be used for wheezing

## 2019-03-31 ENCOUNTER — Ambulatory Visit (INDEPENDENT_AMBULATORY_CARE_PROVIDER_SITE_OTHER): Payer: Medicare Other | Admitting: Family

## 2019-03-31 DIAGNOSIS — R531 Weakness: Secondary | ICD-10-CM

## 2019-03-31 DIAGNOSIS — R058 Other specified cough: Secondary | ICD-10-CM

## 2019-03-31 DIAGNOSIS — R05 Cough: Secondary | ICD-10-CM | POA: Diagnosis not present

## 2019-03-31 MED ORDER — AZITHROMYCIN 250 MG PO TABS: ORAL_TABLET | ORAL | 0 refills | Status: DC

## 2019-03-31 MED ORDER — BENZONATATE 100 MG PO CAPS: 100.0000 mg | ORAL_CAPSULE | Freq: Three times a day (TID) | ORAL | 0 refills | Status: DC | PRN

## 2019-03-31 NOTE — Progress Notes (Signed)
Virginia Edwards is a 86 y.o. female with the following history as recorded in Epic[employer]:  Patient Active Problem List   Diagnosis Date Noted  . Gout 01/06/2019  . Pseudogout of knee, right 01/06/2019  . Delirium 01/06/2019  . Dementia with behavioral disturbance (HCC) 01/06/2019  . Chronic idiopathic constipation 11/26/2018  . Routine general medical examination at a health care facility 11/25/2018  . Urinary frequency 02/03/2018  . Encounter for long-term opiate analgesic use 02/03/2018  . Deficiency anemia 05/14/2016  . OAB (overactive bladder) 05/14/2016  . Insomnia 04/22/2013  . CAD (coronary artery disease) 10/02/2011  . Hyperglycemia 02/22/2011  . Other screening mammogram 08/16/2010  . 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 Essential hypertension, benign 05/17/2010  . Hyperlipidemia with target LDL less than 100 05/17/2010    Current Outpatient Medications  Medication Sig Dispense Refill  . acetaminophen (TYLENOL) 500 MG tablet Take 1,000 mg by mouth every 8 (eight) hours.    Marland Kitchen albuterol (VENTOLIN HFA) 108 (90 Base) MCG/ACT inhaler Inhale 2 puffs into the lungs every 6 (six) hours as needed for wheezing or shortness of breath. 18 g 0  . allopurinol (ZYLOPRIM) 100 MG tablet Take 1 tablet (100 mg total) by mouth daily. 30 tablet 0  . amLODipine (NORVASC) 5 MG tablet Take 1 tablet (5 mg total) by mouth daily. 30 tablet 0  . aspirin 81 MG chewable tablet Chew 81 mg by mouth daily.    Marland Kitchen azithromycin (ZITHROMAX) 250 MG tablet 2 tabs po qd x 1 day; 1 tablet per day x 4 days; 6 tablet 0  . benzonatate (TESSALON) 100 MG capsule Take 1 capsule (100 mg total) by mouth 3 (three) times daily as needed. 20 capsule 0  . bisacodyl (DULCOLAX) 5 MG EC tablet Take 10 mg by mouth daily as needed for moderate constipation.    Marland Kitchen losartan (COZAAR) 50 MG tablet Take 1 tablet (50 mg total) by mouth daily. 30 tablet 0  . promethazine (PHENERGAN) 25 MG tablet Take 25 mg by mouth every 6 (six) hours  as needed for nausea or vomiting.     No current facility-administered medications for this visit.    Allergies: Alendronate sodium, Amlodipine, Hydrocodone-acetaminophen, and Shellfish allergy  Past Medical History:  Diagnosis Date  . Arthritis   . CAD (coronary artery disease)   . Cataracts, bilateral   . Chronic back pain   . Dementia (HCC)   . Eczema   . Hyperlipidemia   . Hypertension    primary Dr. Sanda Linger [phone], saw Dr. Jens Som x 1 cardio  . Osteoporosis   . Sciatica     Past Surgical History:  Procedure Laterality Date  . ABDOMINAL HYSTERECTOMY    . FRACTURE SURGERY     shoulder surgery 2008  . LIPOMA EXCISION     2011  . TOTAL KNEE ARTHROPLASTY  09/27/2011   Procedure: TOTAL KNEE ARTHROPLASTY;  Surgeon: Thera Flake., MD;  Location: [facility] OR;  Service: Orthopedics;  Laterality: Right;  right total knee arthroplasty    Family History  Problem Relation Age of Onset  . Lung cancer Father   . Heart Problems Brother   . Heart attack Brother   . Hypertension Sister     Social History   Tobacco Use  . Smoking status: Never Smoker  . Smokeless tobacco: Never Used  Substance Use Topics  . Alcohol use: No    Subjective:    I connected with Virginia Edwards on 03/31/19 at 11:20 AM  EST by a telephone call and verified that I am speaking with the correct person using two identifiers.   I discussed the limitations of evaluation and management by telemedicine an the availability of in person appointments. The patient expressed understanding and agreed to proceed. Provider in office/ patient is at home; provider and patient, patient's daughter are only 3 people on call.   Patient's daughter provides most of the history; patient has had a cough x 1 month and patient told her daughter that she has been feeling weaker in the past few days; they spoke to an MD 3 weeks ago about the cough and wheezing and recommended ER/ U/C visit- opted not to go at that time; no COVID  testing has been done; per daughter, no fever and is making sure her mother gets plenty of fluids;     Objective:  There were no vitals filed for this visit.  Lungs: Respirations unlabored;   Assessment:  1. Cough present for greater than 3 weeks   2. Weakness     Plan:  I discussed my concerns with patient's daughter about her mother's symptoms especially length of time symptoms have been present and increased weakness. I recommended U/C or ER evaluation as has been recommended previously.  Daughter is instead requesting antibiotic to try today and see how her mother responds. Will treat with Zithromax and daughter agrees to take her mother somewhere if no improvement in the next 1-2 days.  Time spent 20 minutes  No follow-ups on file.  No orders of the defined types were placed in this encounter.   Requested Prescriptions   Signed Prescriptions Disp Refills  . azithromycin (ZITHROMAX) 250 MG tablet 6 tablet 0    Sig: 2 tabs po qd x 1 day; 1 tablet per day x 4 days;  . benzonatate (TESSALON) 100 MG capsule 20 capsule 0    Sig: Take 1 capsule (100 mg total) by mouth 3 (three) times daily as needed.

## 2019-04-29 ENCOUNTER — Telehealth: Payer: Self-pay | Admitting: Internal Medicine

## 2019-04-29 ENCOUNTER — Other Ambulatory Visit: Payer: Self-pay | Admitting: Internal Medicine

## 2019-04-29 DIAGNOSIS — B370 Candidal stomatitis: Secondary | ICD-10-CM

## 2019-04-29 MED ORDER — FLUCONAZOLE 100 MG PO TABS: 100.0000 mg | ORAL_TABLET | Freq: Every day | ORAL | 0 refills | Status: AC

## 2019-04-29 NOTE — Telephone Encounter (Signed)
New Message:   Pt's daughter is calling and states that the patient was prescribed a antibiotic and she thinks it is giving the patient thrush in her mouth. She would like to know if the Dr will send something into the pharmacy to clear this up.  [id number] - [place], [place] - [address] AT [facility] [address]

## 2019-05-10 ENCOUNTER — Telehealth: Payer: Self-pay

## 2019-05-10 NOTE — Telephone Encounter (Signed)
Can you call pt dtr and schedule visit please?

## 2019-05-10 NOTE — Telephone Encounter (Signed)
LVM for daughter to call back and schedule an in office visit.

## 2019-05-10 NOTE — Telephone Encounter (Signed)
Patient's daughter called and spoke with [facility] on 05/08/2019 4:42:37 PM and states Pt was on an ABX and got thrush. Then the Dr gave a medication for the thrush and it started getting better but is now worse again. She also has swelling in her feet and this happens occasionally. No fever or other symptoms. The caller did call an ambulance this morning due to swelling feet and mouth pain and they took her vitals and they were fine. The pt has dementia also.  Advised to see PCP within 3 days.

## 2019-05-11 ENCOUNTER — Telehealth: Payer: Self-pay | Admitting: Internal Medicine

## 2019-05-11 NOTE — Telephone Encounter (Signed)
New message:   Virginia Edwards  Pt's daughter is calling just to let us know that the patient has been sent to the hospital due to having difficulties breathing. She states as soon as she is better she will get the patient in for a f/u.

## 2019-05-12 ENCOUNTER — Telehealth: Payer: Self-pay | Admitting: Internal Medicine

## 2019-05-12 MED ORDER — LOSARTAN POTASSIUM 50 MG PO TABS
50.00 | ORAL_TABLET | ORAL | Status: DC
Start: 2019-05-14 — End: 2019-05-12

## 2019-05-12 MED ORDER — CARVEDILOL 3.125 MG PO TABS
3.13 | ORAL_TABLET | ORAL | Status: DC
Start: 2019-05-14 — End: 2019-05-12

## 2019-05-12 MED ORDER — GENERIC EXTERNAL MEDICATION
Status: DC
Start: ? — End: 2019-05-12

## 2019-05-12 MED ORDER — NITROGLYCERIN 2 % TD OINT
0.50 | TOPICAL_OINTMENT | TRANSDERMAL | Status: DC
Start: 2019-05-12 — End: 2019-05-12

## 2019-05-12 MED ORDER — ALLOPURINOL 100 MG PO TABS
100.00 | ORAL_TABLET | ORAL | Status: DC
Start: 2019-05-14 — End: 2019-05-12

## 2019-05-12 MED ORDER — ONDANSETRON HCL 4 MG/2ML IJ SOLN
4.00 | INTRAMUSCULAR | Status: DC
Start: ? — End: 2019-05-12

## 2019-05-12 MED ORDER — ACETAMINOPHEN 325 MG PO TABS
650.00 | ORAL_TABLET | ORAL | Status: DC
Start: ? — End: 2019-05-12

## 2019-05-12 MED ORDER — FUROSEMIDE 10 MG/ML IJ SOLN
20.00 | INTRAMUSCULAR | Status: DC
Start: 2019-05-12 — End: 2019-05-12

## 2019-05-12 MED ORDER — HEPARIN SODIUM (PORCINE) 5000 UNIT/ML IJ SOLN
5000.00 | INTRAMUSCULAR | Status: DC
Start: 2019-05-14 — End: 2019-05-12

## 2019-05-12 NOTE — Telephone Encounter (Signed)
    Virginia Edwards from [facility] requesting verbal orders for nursing. Patient being discharged from today  Please call [phone]

## 2019-05-13 NOTE — Telephone Encounter (Signed)
F/u   [facility] calling back with alternative phone # [phone]  Asking will Dr. Yetta Barre be attending on her case   Asking for Rn and nursing aide for assistance.

## 2019-05-13 NOTE — Telephone Encounter (Signed)
New message:   Virginia Edwards is calling back in regards to the verbal orders she is needing for the pt. Please advise.

## 2019-05-14 MED ORDER — GENERIC EXTERNAL MEDICATION
Status: DC
Start: 2019-05-14 — End: 2019-05-14

## 2019-05-14 MED ORDER — POTASSIUM CHLORIDE CRYS ER 20 MEQ PO TBCR
40.00 | EXTENDED_RELEASE_TABLET | ORAL | Status: DC
Start: 2019-05-14 — End: 2019-05-14

## 2019-05-14 NOTE — Telephone Encounter (Signed)
Contacted [facility] and informed PCP will be attending on her case.   Verbal okay for RN and Rn aide as requested.

## 2019-06-08 ENCOUNTER — Telehealth: Payer: Self-pay | Admitting: Internal Medicine

## 2019-06-08 NOTE — Telephone Encounter (Signed)
New message:   Virginia Edwards is calling to request a order to delay PT until tomorrow 06/09/19. He states to please leave a msg if he is unavailable. Please advise.

## 2019-06-09 ENCOUNTER — Telehealth: Payer: Self-pay | Admitting: Internal Medicine

## 2019-06-09 NOTE — Telephone Encounter (Signed)
New Message:   [name] is calling with [facility] and states he needs orders to move pt's PT out until next week per the pt's request. Please advise.

## 2019-06-09 NOTE — Telephone Encounter (Signed)
LVM with verbal okay to move PT out far as needed.

## 2019-06-09 NOTE — Telephone Encounter (Signed)
LVM with okay to postpone PT as requested.

## 2019-06-14 ENCOUNTER — Ambulatory Visit: Payer: Medicare Other | Admitting: Internal Medicine

## 2019-06-14 ENCOUNTER — Telehealth: Payer: Self-pay | Admitting: Internal Medicine

## 2019-06-14 DIAGNOSIS — Z0289 Encounter for other administrative examinations: Secondary | ICD-10-CM

## 2019-06-14 NOTE — Telephone Encounter (Signed)
Contacted [facility] and gave verbal okay to cancel [facility] as requested.

## 2019-06-14 NOTE — Telephone Encounter (Signed)
New message:   [name] from [facility] is calling and states the pt's daughter texted him this morning and states the pt no longer wants home health care. He states he needs the order to be canceled. Please advise.

## 2019-07-06 ENCOUNTER — Ambulatory Visit: Payer: Medicare Other | Admitting: Internal Medicine

## 2019-07-06 ENCOUNTER — Telehealth: Payer: Self-pay | Admitting: Internal Medicine

## 2019-07-06 NOTE — Telephone Encounter (Signed)
New message:   [name] states the pt is unable to make this appt today due some issues but states the appt was supposed to be for medication refills. Please advise.

## 2019-07-06 NOTE — Telephone Encounter (Signed)
Nothing to advise.   Appt will have to be rescheduled to receive any refills.

## 2019-07-08 ENCOUNTER — Encounter: Payer: Self-pay | Admitting: Internal Medicine

## 2019-07-08 ENCOUNTER — Other Ambulatory Visit: Payer: Self-pay

## 2019-07-08 ENCOUNTER — Ambulatory Visit (INDEPENDENT_AMBULATORY_CARE_PROVIDER_SITE_OTHER): Payer: Medicare Other | Admitting: Internal Medicine

## 2019-07-08 VITALS — BP 142/98 | HR 72 | Temp 98.3°F | Resp 16 | Ht 62.0 in

## 2019-07-08 DIAGNOSIS — D508 Other iron deficiency anemias: Secondary | ICD-10-CM | POA: Diagnosis not present

## 2019-07-08 DIAGNOSIS — I10 Essential (primary) hypertension: Secondary | ICD-10-CM

## 2019-07-08 DIAGNOSIS — D539 Nutritional anemia, unspecified: Secondary | ICD-10-CM

## 2019-07-08 DIAGNOSIS — I5042 Chronic combined systolic (congestive) and diastolic (congestive) heart failure: Secondary | ICD-10-CM | POA: Diagnosis not present

## 2019-07-08 LAB — CBC WITH DIFFERENTIAL/PLATELET
Basophils Absolute: 0 10*3/uL (ref 0.0–0.1)
Basophils Relative: 0.5 % (ref 0.0–3.0)
Eosinophils Absolute: 0.1 10*3/uL (ref 0.0–0.7)
Eosinophils Relative: 1.8 % (ref 0.0–5.0)
HCT: 33.7 % — ABNORMAL LOW (ref 36.0–46.0)
Hemoglobin: 10.3 g/dL — ABNORMAL LOW (ref 12.0–15.0)
Lymphocytes Relative: 22.9 % (ref 12.0–46.0)
Lymphs Abs: 1.6 10*3/uL (ref 0.7–4.0)
MCHC: 30.7 g/dL (ref 30.0–36.0)
MCV: 83.8 fl (ref 78.0–100.0)
Monocytes Absolute: 0.6 10*3/uL (ref 0.1–1.0)
Monocytes Relative: 9 % (ref 3.0–12.0)
Neutro Abs: 4.5 10*3/uL (ref 1.4–7.7)
Neutrophils Relative %: 65.8 % (ref 43.0–77.0)
Platelets: 193 10*3/uL (ref 150.0–400.0)
RBC: 4.02 Mil/uL (ref 3.87–5.11)
RDW: 31 % — ABNORMAL HIGH (ref 11.5–15.5)
WBC: 6.8 10*3/uL (ref 4.0–10.5)

## 2019-07-08 LAB — TSH: TSH: 2.52 u[IU]/mL (ref 0.35–4.50)

## 2019-07-08 LAB — BASIC METABOLIC PANEL
BUN: 15 mg/dL (ref 6–23)
CO2: 31 mEq/L (ref 19–32)
Calcium: 9.7 mg/dL (ref 8.4–10.5)
Chloride: 108 mEq/L (ref 96–112)
Creatinine, Ser: 0.63 mg/dL (ref 0.40–1.20)
GFR: 88.39 mL/min (ref >60.00)
Glucose, Bld: 99 mg/dL (ref 70–99)
Potassium: 3.7 mEq/L (ref 3.5–5.1)
Sodium: 142 mEq/L (ref 135–145)

## 2019-07-08 LAB — BRAIN NATRIURETIC PEPTIDE: Pro B Natriuretic peptide (BNP): 1373 pg/mL — ABNORMAL HIGH (ref 0.0–100.0)

## 2019-07-08 LAB — HEPATIC FUNCTION PANEL
ALT: 17 U/L (ref 0–35)
AST: 22 U/L (ref 0–37)
Albumin: 3.6 g/dL (ref 3.5–5.2)
Alkaline Phosphatase: 75 U/L (ref 39–117)
Bilirubin, Direct: 0.2 mg/dL (ref 0.0–0.3)
Total Bilirubin: 0.6 mg/dL (ref 0.2–1.2)
Total Protein: 6.3 g/dL (ref 6.0–8.3)

## 2019-07-08 LAB — IBC PANEL
Iron: 169 ug/dL — ABNORMAL HIGH (ref 42–145)
Saturation Ratios: 39.7 % (ref 20.0–50.0)
Transferrin: 304 mg/dL (ref 212.0–360.0)

## 2019-07-08 LAB — FERRITIN: Ferritin: 16.7 ng/mL (ref 10.0–291.0)

## 2019-07-08 MED ORDER — TORSEMIDE 10 MG PO TABS: 10.0000 mg | ORAL_TABLET | Freq: Every day | ORAL | 0 refills | Status: AC

## 2019-07-08 MED ORDER — TORSEMIDE 10 MG PO TABS: 10.0000 mg | ORAL_TABLET | Freq: Every day | ORAL | 0 refills | Status: DC

## 2019-07-08 NOTE — Progress Notes (Signed)
Subjective:  Patient ID: Virginia Edwards, female    DOB: February 17, 1925  Age: 86 y.o. MRN: [id number]  CC: Congestive Heart Failure and Anemia  This visit occurred during the SARS-CoV-2 public health emergency.  Safety protocols were in place, including screening questions prior to the visit, additional usage of staff PPE, and extensive cleaning of exam room while observing appropriate contact time as indicated for disinfecting solutions.     HPI Virginia Edwards presents for f/up -Most of the history is obtained through a caregiver who is with her today.  Virginia Edwards was admitted to an outside hospital about 2 months ago for CHF.  She received a transfusion for anemia.  She was told that she had iron deficiency but testing for blood in the stool was negative.  She has been taking an iron supplement since discharge.  There is concern about worsening lower extremity edema.  The caregiver has not noticed any coughing or respiratory distress.  She is taking carvedilol and losartan.  Outpatient Medications Prior to Visit  Medication Sig Dispense Refill   acetaminophen (TYLENOL) 500 MG tablet Take 1,000 mg by mouth every 8 (eight) hours.     albuterol (VENTOLIN HFA) 108 (90 Base) MCG/ACT inhaler Inhale 2 puffs into the lungs every 6 (six) hours as needed for wheezing or shortness of breath. 18 g 0   allopurinol (ZYLOPRIM) 100 MG tablet Take 1 tablet (100 mg total) by mouth daily. 30 tablet 0   aspirin 81 MG chewable tablet Chew 81 mg by mouth daily.     bisacodyl (DULCOLAX) 5 MG EC tablet Take 10 mg by mouth daily as needed for moderate constipation.     amLODipine (NORVASC) 5 MG tablet Take 1 tablet (5 mg total) by mouth daily. 30 tablet 0   atorvastatin (LIPITOR) 40 MG tablet Take 40 mg by mouth daily.     azithromycin (ZITHROMAX) 250 MG tablet 2 tabs po qd x 1 day; 1 tablet per day x 4 days; 6 tablet 0   benzonatate (TESSALON) 100 MG capsule Take 1 capsule (100 mg total) by mouth 3 (three) times  daily as needed. 20 capsule 0   carvedilol (COREG) 3.125 MG tablet Take 3.125 mg by mouth 2 (two) times daily.     escitalopram (LEXAPRO) 5 MG tablet Take 5 mg by mouth daily.     iron polysaccharides (NIFEREX) 150 MG capsule Take by mouth.     losartan (COZAAR) 50 MG tablet Take 1 tablet (50 mg total) by mouth daily. 30 tablet 0   promethazine (PHENERGAN) 25 MG tablet Take 25 mg by mouth every 6 (six) hours as needed for nausea or vomiting.     No facility-administered medications prior to visit.    ROS Review of Systems  Constitutional: Negative for diaphoresis and unexpected weight change.  HENT: Negative.   Eyes: Negative.   Respiratory: Negative for cough, chest tightness, shortness of breath and wheezing.   Cardiovascular: Positive for leg swelling. Negative for chest pain and palpitations.  Gastrointestinal: Negative for abdominal pain, constipation, nausea and vomiting.  Endocrine: Negative.   Genitourinary: Negative.  Negative for difficulty urinating.  Musculoskeletal: Negative.   Skin: Negative.  Negative for color change and pallor.  Neurological: Positive for weakness. Negative for syncope and headaches.  Hematological: Negative for adenopathy. Does not bruise/bleed easily.  Psychiatric/Behavioral: Positive for confusion and decreased concentration.    Objective:  BP (!) 142/98 (BP Location: Left Arm, Patient Position: Sitting, Cuff Size: Normal)    Pulse  72    Temp 98.3 F (36.8 C) (Oral)    Resp 16    Ht 5\' 2"  (1.575 m)    SpO2 95%    BMI 26.63 kg/m   BP Readings from Last 3 Encounters:  07/08/19 (!) 142/98  01/22/19 108/78  01/01/19 108/78    Wt Readings from Last 3 Encounters:  01/01/19 145 lb 9.6 oz (66 kg)  12/30/18 148 lb 12 oz (67.5 kg)  11/25/18 148 lb 12 oz (67.5 kg)    Physical Exam Vitals reviewed.  Constitutional:      General: She is not in acute distress.    Appearance: Normal appearance. She is obese. She is ill-appearing (in a  wheelchair). She is not diaphoretic.  HENT:     Mouth/Throat:     Mouth: Mucous membranes are moist.  Eyes:     General: No scleral icterus.    Conjunctiva/sclera: Conjunctivae normal.  Cardiovascular:     Rate and Rhythm: Normal rate and regular rhythm.     Heart sounds: Murmur heard.  Systolic murmur is present with a grade of 3/6.  No diastolic murmur is present.  No gallop.   Pulmonary:     Effort: Pulmonary effort is normal.     Breath sounds: Examination of the right-upper field reveals decreased breath sounds. Examination of the left-upper field reveals decreased breath sounds. Examination of the right-middle field reveals decreased breath sounds. Examination of the left-middle field reveals decreased breath sounds. Examination of the right-lower field reveals decreased breath sounds. Examination of the left-lower field reveals decreased breath sounds. Decreased breath sounds present. No wheezing, rhonchi or rales.  Abdominal:     General: Abdomen is protuberant. Bowel sounds are normal. There is no distension.     Palpations: Abdomen is soft. There is no hepatomegaly, splenomegaly or mass.     Tenderness: There is no abdominal tenderness.  Musculoskeletal:        General: Normal range of motion.     Cervical back: Neck supple.     Right lower leg: 1+ Edema present.     Left lower leg: 1+ Edema present.  Lymphadenopathy:     Cervical: No cervical adenopathy.  Skin:    General: Skin is warm and dry.     Coloration: Skin is not pale.  Neurological:     General: No focal deficit present.     Mental Status: She is alert and oriented to person, place, and time.  Psychiatric:        Speech: She is noncommunicative.     Lab Results  Component Value Date   WBC 6.8 07/08/2019   HGB 10.3 (L) 07/08/2019   HCT 33.7 (L) 07/08/2019   PLT 193.0 07/08/2019   GLUCOSE 99 07/08/2019   CHOL 257 (H) 11/25/2018   TRIG 103.0 11/25/2018   HDL 59.30 11/25/2018   LDLDIRECT 151.0  05/14/2016   LDLCALC 177 (H) 11/25/2018   ALT 17 07/08/2019   AST 22 07/08/2019   NA 142 07/08/2019   K 3.7 07/08/2019   CL 108 07/08/2019   CREATININE 0.63 07/08/2019   BUN 15 07/08/2019   CO2 31 07/08/2019   TSH 2.52 07/08/2019   INR 0.94 10/02/2011   HGBA1C 6.0 11/25/2018    DG Abd Acute W/Chest  Result Date: 11/26/2018 CLINICAL DATA:  Bilateral flank pain for 1 month. EXAM: DG ABDOMEN ACUTE W/ 1V CHEST COMPARISON:  Chest x-ray 02/25/2018 FINDINGS: The upright chest x-ray demonstrates stable cardiomediastinal contours. Mild tortuosity and calcification  of the thoracic aorta. The lungs are clear. No worrisome pulmonary lesions. Two views of the abdomen demonstrate a large amount of stool throughout the colon and down into the rectosigmoid area suggesting constipation. No findings for small bowel obstruction or free air. The soft tissue shadows of the abdomen are grossly maintained. No worrisome calcifications. Advanced aortic calcifications are noted without definite aneurysm. Significant left convex lumbar scoliosis and associated degenerative lumbar spondylosis. Sclerotic lesion in the right iliac bone is unchanged since 2017 and consistent with a benign bone island. IMPRESSION: 1. Large amount of stool throughout the colon suggesting constipation. No findings for small bowel obstruction or free air. 2. No acute cardiopulmonary findings. Electronically Signed   By: Marijo Sanes M.D.   On: 11/26/2018 08:41    Assessment & Plan:   Jordynne was seen today for congestive heart failure and anemia.  Diagnoses and all orders for this visit:  Essential hypertension, benign- Her blood pressure is not adequately well controlled.  Will add a loop diuretic. -     Basic metabolic panel; Future -     TSH; Future -     Hepatic function panel; Future -     Hepatic function panel -     TSH -     Basic metabolic panel  Deficiency anemia- She remains anemic but her iron level is high.  I have asked  her to stop the iron supplement and return to be screened for other vitamin deficiencies. -     Reticulocytes; Future -     Folate; Future -     Vitamin B1; Future -     Vitamin B12; Future  Chronic combined systolic and diastolic congestive heart failure ([place])- She has signs and symptoms of fluid overload.  I have asked her to start taking a loop diuretic.  Will continue the other meds.  I have also asked her to have a follow-up with cardiology. -     Brain natriuretic peptide; Future -     Hepatic function panel; Future -     Ambulatory referral to Cardiology -     Discontinue: torsemide (DEMADEX) 10 MG tablet; Take 1 tablet (10 mg total) by mouth daily. -     torsemide (DEMADEX) 10 MG tablet; Take 1 tablet (10 mg total) by mouth daily. -     Hepatic function panel -     Brain natriuretic peptide  Iron deficiency anemia due to dietary causes- Her H&H remain low but iron level is high.  I have asked her to stop taking the iron supplement. -     IBC panel; Future -     CBC with Differential/Platelet; Future -     Ferritin; Future -     Ferritin -     CBC with Differential/Platelet -     IBC panel   I have discontinued Marcie Bal Coil's promethazine, amLODipine, azithromycin, benzonatate, and iron polysaccharides. I am also having her maintain her acetaminophen, aspirin, bisacodyl, allopurinol, albuterol, and torsemide.  Meds ordered this encounter  Medications   DISCONTD: torsemide (DEMADEX) 10 MG tablet    Sig: Take 1 tablet (10 mg total) by mouth daily.    Dispense:  90 tablet    Refill:  0   torsemide (DEMADEX) 10 MG tablet    Sig: Take 1 tablet (10 mg total) by mouth daily.    Dispense:  90 tablet    Refill:  0   I spent 50 minutes in preparing to see the patient  by review of recent labs, imaging and procedures, obtaining and reviewing separately obtained history, communicating with the patient and family or caregiver, ordering medications, tests or procedures, and documenting  clinical information in the EHR including the differential Dx, treatment, and any further evaluation and other management of 1. Essential hypertension, benign 2. Deficiency anemia 3. Chronic combined systolic and diastolic congestive heart failure (HCC) 4. Iron deficiency anemia due to dietary causes     Follow-up: No follow-ups on file.  Sanda Linger, MD

## 2019-07-09 ENCOUNTER — Other Ambulatory Visit: Payer: Self-pay | Admitting: Internal Medicine

## 2019-07-09 ENCOUNTER — Encounter: Payer: Self-pay | Admitting: Internal Medicine

## 2019-07-09 ENCOUNTER — Telehealth: Payer: Self-pay | Admitting: Internal Medicine

## 2019-07-09 MED ORDER — ESCITALOPRAM OXALATE 5 MG PO TABS: 5.0000 mg | ORAL_TABLET | Freq: Every day | ORAL | 0 refills | Status: AC

## 2019-07-09 MED ORDER — CARVEDILOL 3.125 MG PO TABS: 3.1250 mg | ORAL_TABLET | Freq: Two times a day (BID) | ORAL | 0 refills | Status: AC

## 2019-07-09 MED ORDER — ATORVASTATIN CALCIUM 40 MG PO TABS: 40.0000 mg | ORAL_TABLET | Freq: Every day | ORAL | 0 refills | Status: DC

## 2019-07-09 MED ORDER — LOSARTAN POTASSIUM 50 MG PO TABS: 50.0000 mg | ORAL_TABLET | Freq: Every day | ORAL | 0 refills | Status: DC

## 2019-07-09 NOTE — Telephone Encounter (Signed)
lvm for pt dtr [name]) to call back for the results.

## 2019-07-09 NOTE — Patient Instructions (Signed)
Heart Failure, Diagnosis  Heart failure is a condition in which the heart has trouble pumping blood because it has become weak or stiff. This means that the heart does not pump blood well enough for the body to stay healthy. For some people with heart failure, fluid may back up into the lungs. There may also be swelling (edema) in the lower legs. Heart failure is usually a long-term (chronic) condition. It is important for you to take good care of yourself and follow the treatment plan from your health care provider. What are the causes? This condition may be caused by:  High blood pressure (hypertension). Hypertension causes the heart muscle to work harder than normal. This makes the heart stiff or weak.  Coronary artery disease, or CAD. CAD is the buildup of cholesterol and fat (plaque) in the arteries of the heart.  Heart attack, also called myocardial infarction. This injures the heart muscle, making it hard for the heart to pump blood.  Abnormal heart valves. The valves do not open and close properly, forcing the heart to pump harder to keep the blood flowing.  Heart muscle disease (cardiomyopathy or myocarditis). This is damage to the heart muscle. It can increase the risk of heart failure.  Lung disease. The heart works harder when the lungs are not healthy.  Abnormal heart rhythms. These can lead to heart failure. What increases the risk? The risk of heart failure increases as a person ages. This condition is also more likely to develop in people who:  Are overweight.  Are female.  Smoke or chew tobacco.  Abuse alcohol or illegal drugs.  Have taken medicines that can damage the heart, such as chemotherapy drugs.  Have diabetes.  Have abnormal heart rhythms.  Have thyroid problems.  Have low blood counts (anemia). What are the signs or symptoms? Symptoms of this condition include:  Shortness of breath with activity, such as when climbing stairs.  A cough that does not  go away.  Swelling of the feet, ankles, legs, or abdomen.  Losing weight for no reason.  Trouble breathing when lying flat (orthopnea).  Waking from sleep because of the need to sit up and get more air.  Rapid heartbeat.  Tiredness (fatigue) and loss of energy.  Feeling light-headed, dizzy, or close to fainting.  Loss of appetite.  Nausea.  Waking up more often during the night to urinate (nocturia).  Confusion. How is this diagnosed? This condition is diagnosed based on:  Your medical history, symptoms, and a physical exam.  Diagnostic tests, which may include: ? Echocardiogram. ? Electrocardiogram (ECG). ? Chest X-ray. ? Blood tests. ? Exercise stress test. ? Radionuclide scans. ? Cardiac catheterization and angiogram. How is this treated? Treatment for this condition is aimed at managing the symptoms of heart failure. Medicines Treatment may include medicines that:  Help lower blood pressure by relaxing (dilating) the blood vessels. These medicines are called ACE inhibitors (angiotensin-converting enzyme) and ARBs (angiotensin receptor blockers).  Cause the kidneys to remove salt and water from the blood through urination (diuretics).  Improve heart muscle strength and prevent the heart from beating too fast (beta blockers).  Increase the force of the heartbeat (digoxin). Healthy behavior changes     Treatment may also include making healthy lifestyle changes, such as:  Reaching and staying at a healthy weight.  Quitting smoking or chewing tobacco.  Eating heart-healthy foods.  Limiting or avoiding alcohol.  Stopping the use of illegal drugs.  Being physically active.  Other treatments   Other treatments may include:  Procedures to open blocked arteries or repair damaged valves.  Placing a pacemaker to improve heart function (cardiac resynchronization therapy).  Placing a device to treat serious abnormal heart rhythms (implantable cardioverter  defibrillator, or ICD).  Placing a device to improve the pumping ability of the heart (left ventricular assist device, or LVAD).  Receiving a healthy heart from a donor (heart transplant). This is done when other treatments have not helped. Follow these instructions at home:  Manage other health conditions as told by your health care provider. These may include hypertension, diabetes, thyroid disease, or abnormal heart rhythms.  Get ongoing education and support as needed. Learn as much as you can about heart failure.  Keep all follow-up visits as told by your health care provider. This is important. Summary  Heart failure is a condition in which the heart has trouble pumping blood because it has become weak or stiff.  This condition is caused by high blood pressure and other diseases of the heart and lungs.  Symptoms of this condition include shortness of breath, tiredness (fatigue), nausea, and swelling of the feet, ankles, legs, or abdomen.  Treatments for this condition may include medicines, lifestyle changes, and surgery.  Manage other health conditions as told by your health care provider. This information is not intended to replace advice given to you by your health care provider. Make sure you discuss any questions you have with your health care provider. Document Revised: 03/27/2018 Document Reviewed: 03/27/2018 Elsevier Patient Education  2020 [employer]  

## 2019-07-09 NOTE — Telephone Encounter (Signed)
Sent in a 30 day supply of the carvedilol, atorvastatin, losartan and the lexapro.

## 2019-07-09 NOTE — Telephone Encounter (Signed)
New message:    Pt's daughter is calling and states the some medications were supposed to be sent in to the pharmacy on yesterday on behalf of the pt. She states some of them were given in the hospital and states that Dr. Yetta Barre said he would take over prescribing this medication. Please advise.

## 2019-07-11 IMAGING — DX DG LUMBAR SPINE COMPLETE 4+V
5 series · 5 of 5 positions shown · non-contrast
Comparison: None.

CLINICAL DATA: Status post fall last night

EXAM:
LUMBAR SPINE - COMPLETE 4+ VIEW

[l-spine ap]
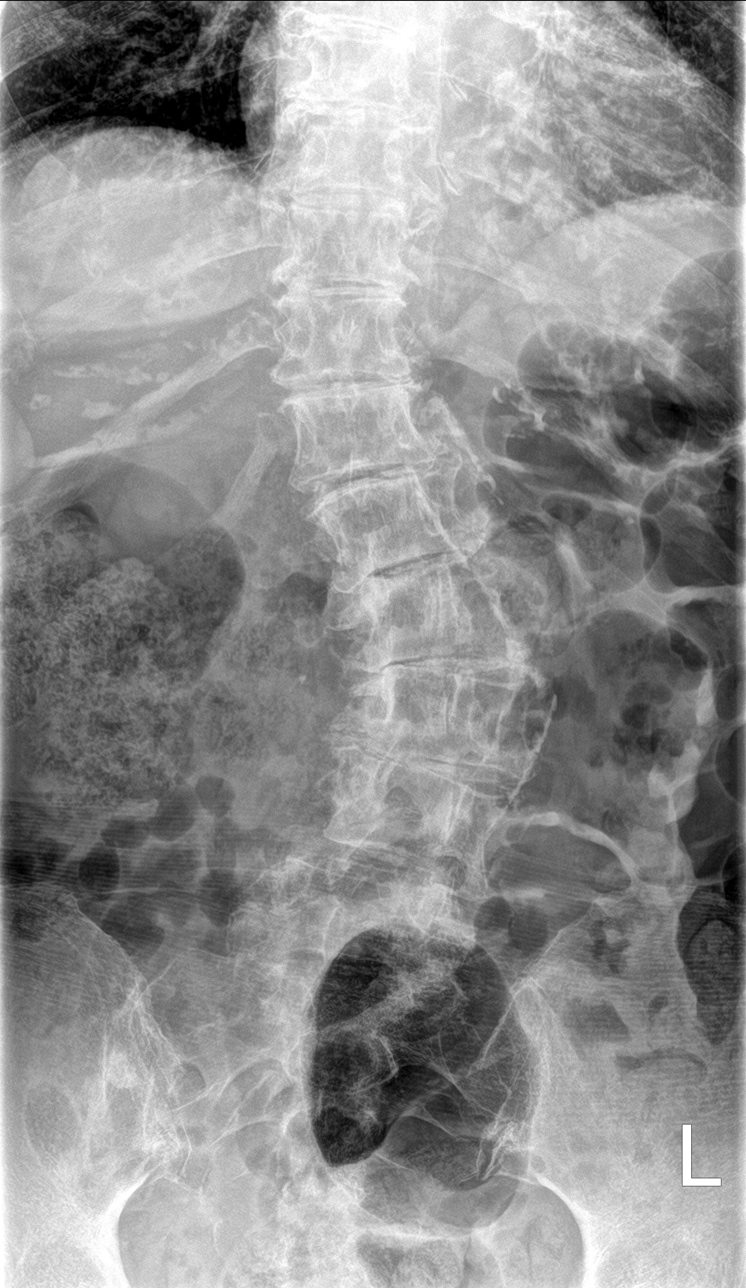

[l-spine obl (1 of 2)]
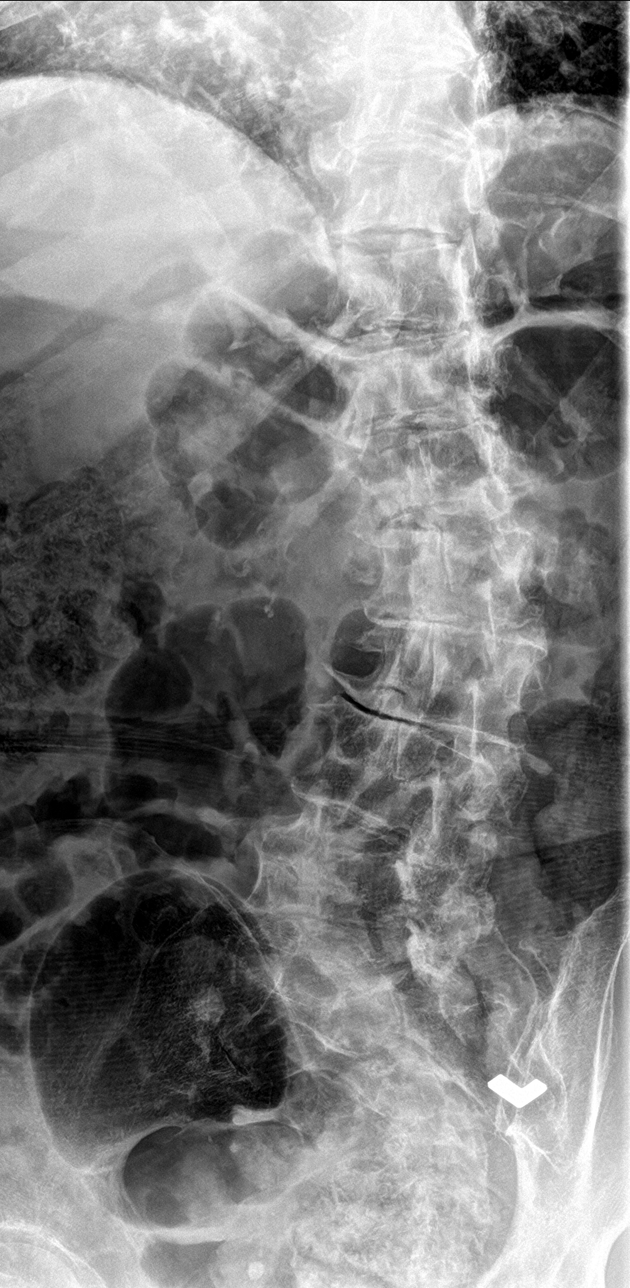

[l-spine obl (2 of 2)]
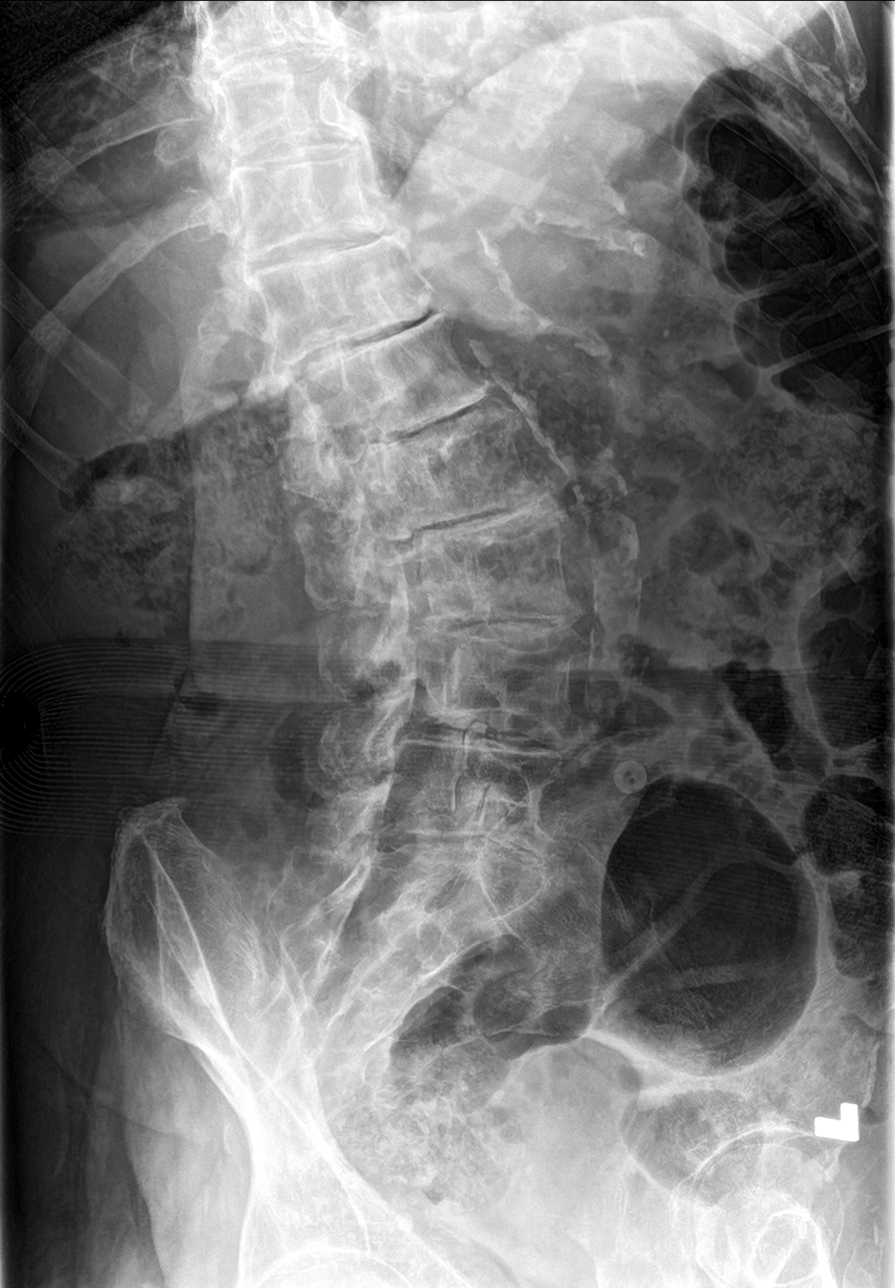

[l-spine lat]
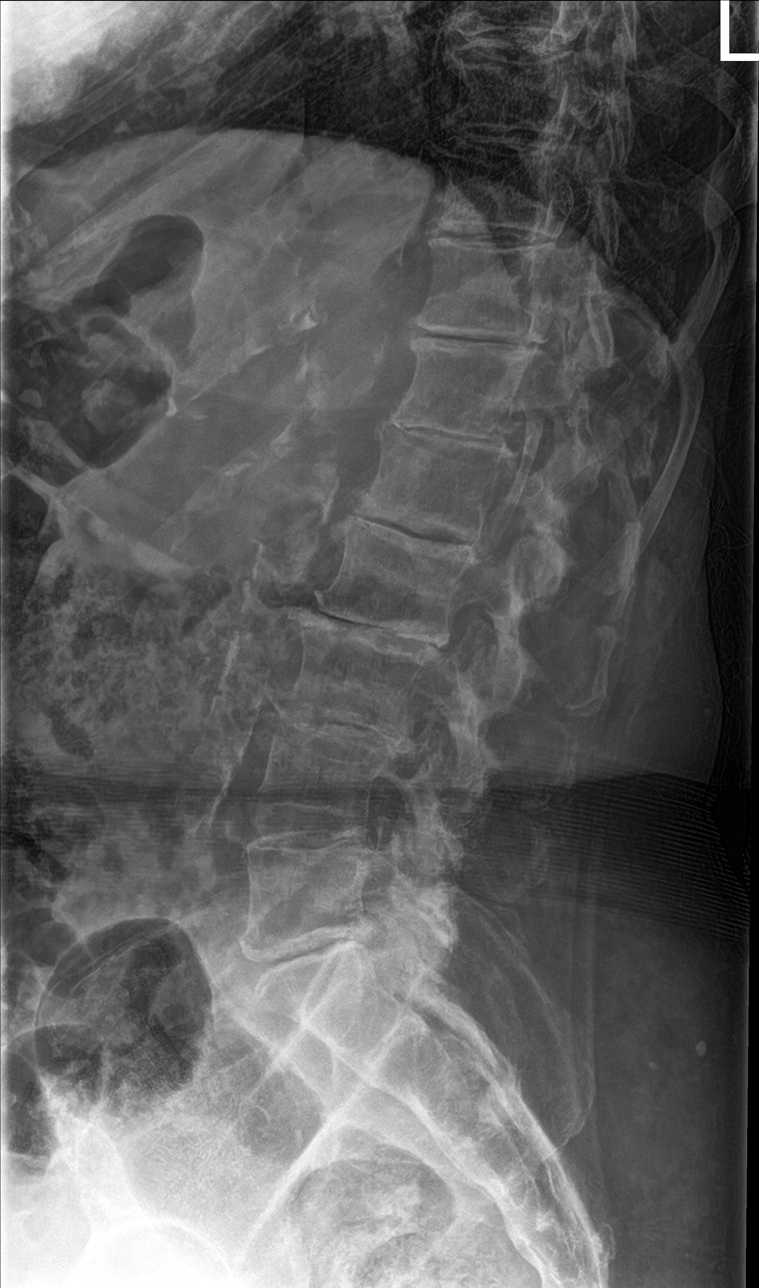

[l-spine spot]
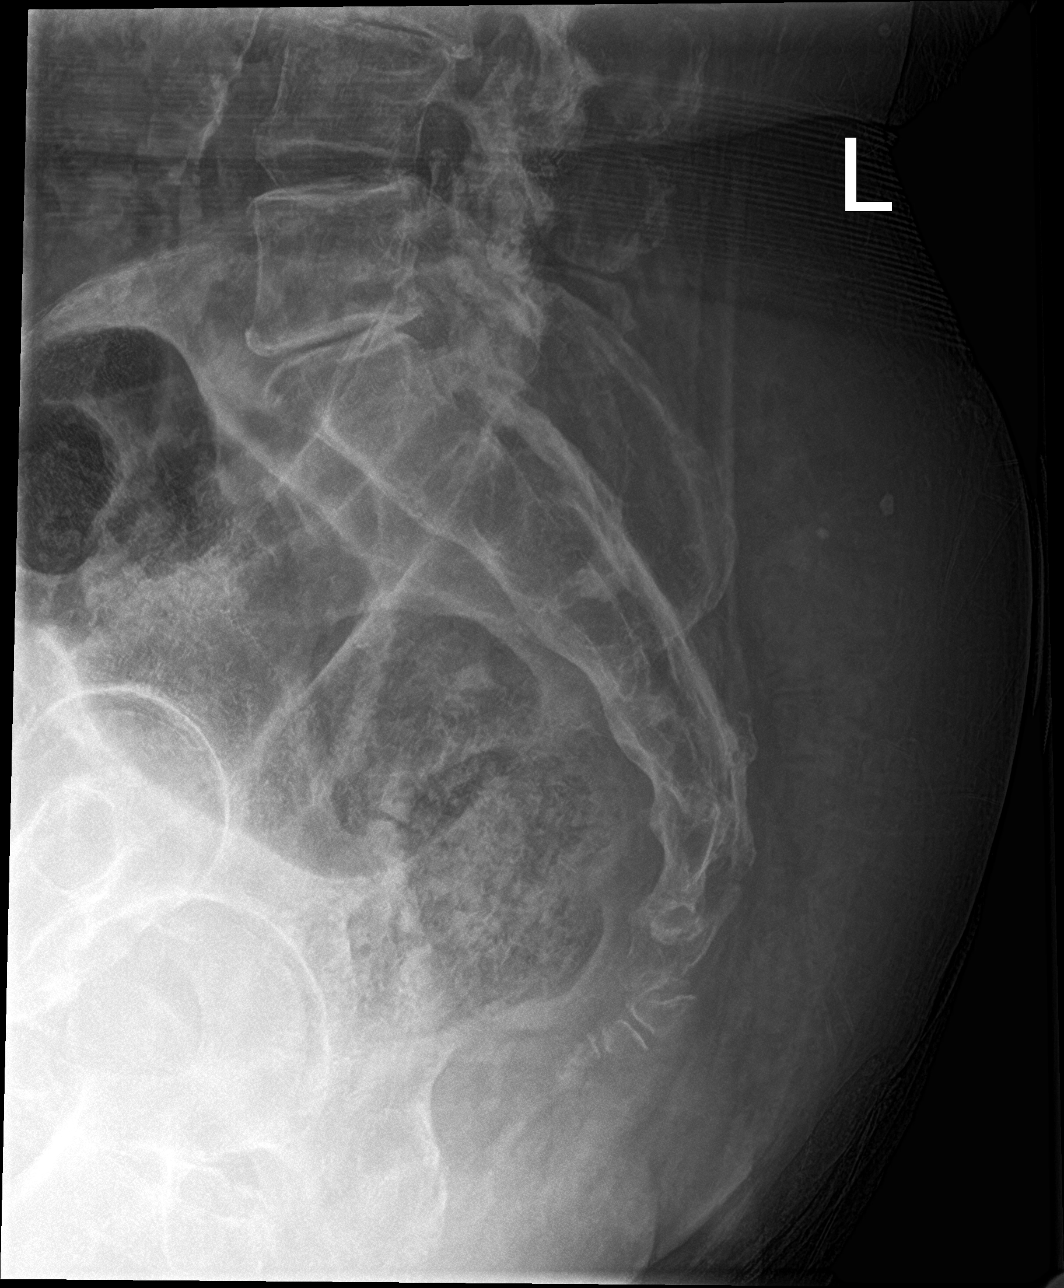

[5 of 5 positions shown; findings below may reference images not displayed]

FINDINGS: There is no evidence of lumbar spine fracture. Levoscoliosis of the
lumbar spine. Generalized osteopenia. Degenerative disc disease with
disc height loss throughout the thoracolumbar spine. There is
bilateral facet arthropathy of the lumbar spine most severe at
L5-S1.

There is abdominal aortic atherosclerosis.
IMPRESSION: No acute osseous injury of the lumbar spine.

## 2019-07-15 ENCOUNTER — Encounter: Payer: Self-pay | Admitting: Internal Medicine

## 2019-07-30 ENCOUNTER — Telehealth: Payer: Self-pay | Admitting: Internal Medicine

## 2019-07-30 NOTE — Telephone Encounter (Signed)
Request to write a letter stating pt has dementia   Lvm informed [name] that labs have been ordered since 07/09/2019 and that she can take the pt has her earliest convenience per our last conversation.

## 2019-07-30 NOTE — Telephone Encounter (Signed)
New message:   Pt's daughter is calling and states she is needing a letter from the doctor and medical records showing that the pt has dementia for guardianship. Pt's daughter is also asking about the pt getting labs to see if she has a vitamin defiencey. She states Dr. Yetta Barre stated he wanted this done but no order has been placed. Please advise.

## 2019-08-02 ENCOUNTER — Encounter: Payer: Self-pay | Admitting: Internal Medicine

## 2019-08-16 ENCOUNTER — Telehealth: Payer: Self-pay | Admitting: Internal Medicine

## 2019-08-16 DIAGNOSIS — R319 Hematuria, unspecified: Secondary | ICD-10-CM

## 2019-08-16 NOTE — Telephone Encounter (Signed)
    Patient's daughter Darrick Penna calling to report patient has some blood in urine. No other symptoms Declined appointment, stating too difficult to bring patient into office  Please advise

## 2019-08-17 NOTE — Telephone Encounter (Signed)
   Patient's daughter calling to report patient still has some blood in her urine, again declined to make appointment. Requesting antibiotic be called

## 2019-08-18 NOTE — Telephone Encounter (Signed)
Virginia Edwards C 8 minutes ago (3:07 PM)  GD   New message    The daughter calls want to know verse coming back to [facility] to pick up the urine specimen container she can go to the nearest drug store and pay for one specimen container and bring it back to the office.        Documentation

## 2019-08-18 NOTE — Telephone Encounter (Signed)
Pt dtr declines to schedule an appointment to evaluate blood in urine due to how difficult it is to bring her mother in. No other symptoms according to dtr. Dtr just wants an antibiotic called in.   We can have dtr pick up a urine cup if you approve.   Please advise

## 2019-08-18 NOTE — Telephone Encounter (Signed)
New message    The daughter calls want to know verse coming back to [facility] to pick up the urine specimen container she can go to the nearest drug store and pay for one specimen container and bring it back to the office.

## 2019-08-18 NOTE — Telephone Encounter (Signed)
[name] contacted and informed per PCP urine UA and culture is needed.   [name] informed of same and stated understanding.

## 2019-08-19 ENCOUNTER — Other Ambulatory Visit: Payer: Medicare Other

## 2019-08-19 DIAGNOSIS — R319 Hematuria, unspecified: Secondary | ICD-10-CM

## 2019-08-19 NOTE — Addendum Note (Signed)
Addended by: Merrilyn Puma on: 08/19/2019 03:11 PM   Modules accepted: Orders

## 2019-08-19 NOTE — Telephone Encounter (Signed)
Patient's daughter came and picked up urine specimen supplies. Orders placed.

## 2019-08-19 NOTE — Telephone Encounter (Addendum)
Patient's daughter dropped off a frozen urine specimen. I left a message for her to call back. I am afraid this may give a erroneous result on the patient's culture.   Per [facility] storage requirements- a frozen specimen is cause for rejection.  Patient's daughter called back- she requests we go ahead and send specimen to [facility].

## 2019-08-21 ENCOUNTER — Other Ambulatory Visit: Payer: Self-pay | Admitting: Internal Medicine

## 2019-08-21 DIAGNOSIS — N390 Urinary tract infection, site not specified: Secondary | ICD-10-CM | POA: Insufficient documentation

## 2019-08-21 LAB — URINALYSIS, ROUTINE W REFLEX MICROSCOPIC
Bilirubin Urine: NEGATIVE
Glucose, UA: NEGATIVE
Hyaline Cast: NONE SEEN /LPF
Ketones, ur: NEGATIVE
Nitrite: POSITIVE — AB
Specific Gravity, Urine: 1.011 (ref 1.001–1.03)
pH: 6.5 (ref 5.0–8.0)

## 2019-08-21 LAB — URINE CULTURE

## 2019-08-21 MED ORDER — SULFAMETHOXAZOLE-TRIMETHOPRIM 800-160 MG PO TABS: 1.0000 | ORAL_TABLET | Freq: Two times a day (BID) | ORAL | 0 refills | Status: DC

## 2019-08-21 MED ORDER — SULFAMETHOXAZOLE-TRIMETHOPRIM 800-160 MG PO TABS: 1.0000 | ORAL_TABLET | Freq: Two times a day (BID) | ORAL | 0 refills | Status: AC

## 2019-08-23 ENCOUNTER — Encounter: Payer: Self-pay | Admitting: Internal Medicine

## 2019-08-23 ENCOUNTER — Other Ambulatory Visit: Payer: Self-pay | Admitting: Internal Medicine

## 2019-08-23 DIAGNOSIS — D539 Nutritional anemia, unspecified: Secondary | ICD-10-CM

## 2019-08-23 MED ORDER — B12 FOLATE 800-800 MCG PO CAPS: 1.0000 | ORAL_CAPSULE | Freq: Every day | ORAL | 1 refills | Status: AC

## 2019-09-17 ENCOUNTER — Other Ambulatory Visit: Payer: Medicare Other

## 2019-09-17 ENCOUNTER — Telehealth: Payer: Self-pay | Admitting: Internal Medicine

## 2019-09-17 DIAGNOSIS — R300 Dysuria: Secondary | ICD-10-CM

## 2019-09-17 DIAGNOSIS — R350 Frequency of micturition: Secondary | ICD-10-CM

## 2019-09-17 NOTE — Telephone Encounter (Signed)
Ambulance was called for your mom on 8.26.21 all vitals are good, no fever, but still experiencing a lot of pain and discomfort and medics stated probably still from UTI.  Call patient in another antibiotic and something for pain, patient taking aleve now.  Patient too weak to come into the office  Paramedics stated lots of covid patients right now, and better that she the MD  Please call Virginia Edwards at [phone]

## 2019-09-17 NOTE — Telephone Encounter (Signed)
Called [name] and informed that we need a specimen so that we can culture and treat the infection appropriately. I explained that not all abx are not the same and do not treat the same type of infections. I also informed that treating it correctly would prevent undue harm to pt. [name] stated understanding and was worried about pt getting any sicker or dying. I told her that it would take 48 hours for the results and that she could get septic. [name] will call if the symptoms get worse and will try to bring in a sample today.

## 2019-09-17 NOTE — Telephone Encounter (Signed)
Can daughter bring in another sample?

## 2019-09-17 NOTE — Telephone Encounter (Signed)
    Daughter made aware specimen needed, she is requesting call from [facility]

## 2019-09-17 NOTE — Telephone Encounter (Signed)
Okay to give Virginia Edwards some urine cups and a urine hat.

## 2019-09-20 ENCOUNTER — Telehealth: Payer: Self-pay | Admitting: Internal Medicine

## 2019-09-20 ENCOUNTER — Encounter: Payer: Self-pay | Admitting: Internal Medicine

## 2019-09-20 ENCOUNTER — Other Ambulatory Visit: Payer: Self-pay | Admitting: Internal Medicine

## 2019-09-20 ENCOUNTER — Other Ambulatory Visit: Payer: Self-pay | Admitting: Family Medicine

## 2019-09-20 DIAGNOSIS — G8929 Other chronic pain: Secondary | ICD-10-CM | POA: Insufficient documentation

## 2019-09-20 DIAGNOSIS — Z515 Encounter for palliative care: Secondary | ICD-10-CM

## 2019-09-20 DIAGNOSIS — N390 Urinary tract infection, site not specified: Secondary | ICD-10-CM

## 2019-09-20 DIAGNOSIS — M549 Dorsalgia, unspecified: Secondary | ICD-10-CM | POA: Insufficient documentation

## 2019-09-20 LAB — CULTURE, URINE COMPREHENSIVE

## 2019-09-20 MED ORDER — LEVOFLOXACIN 500 MG PO TABS: 500.0000 mg | ORAL_TABLET | Freq: Every day | ORAL | 0 refills | Status: AC

## 2019-09-20 MED ORDER — MORPHINE SULFATE 10 MG/5ML PO SOLN: 10.0000 mg | Freq: Four times a day (QID) | ORAL | 0 refills | Status: DC | PRN

## 2019-09-20 MED ORDER — ALBUTEROL SULFATE HFA 108 (90 BASE) MCG/ACT IN AERS: 2.0000 | INHALATION_SPRAY | Freq: Four times a day (QID) | RESPIRATORY_TRACT | 0 refills | Status: DC | PRN

## 2019-09-20 NOTE — Telephone Encounter (Signed)
suzzane calling back re: Urine test results  Please call her [phone]

## 2019-09-20 NOTE — Telephone Encounter (Signed)
3 MyChart messages have already been forwarded to PCP in regard today prior to this phone call. PCP will respond when able due to working in clinic.

## 2019-09-20 NOTE — Telephone Encounter (Signed)
    Patient's daughter [name] calling for urine test results Please call

## 2019-09-23 ENCOUNTER — Telehealth: Payer: Self-pay | Admitting: Internal Medicine

## 2019-09-23 NOTE — Progress Notes (Signed)
°  Chronic Care Management   Note  09/23/2019 Name: Zacari Radick MRN: [id number] DOB: [birth year]  Mylee Falin is a 86 y.o. year old female who is a primary care patient of Etta Grandchild, MD. I reached out to [name] by phone today in response to a referral sent by Ms. Evalyn Casco PCP, Etta Grandchild, MD.   Ms. Hector was given information about Chronic Care Management services today including:  1. CCM service includes personalized support from designated clinical staff supervised by her physician, including individualized plan of care and coordination with other care providers 2. 24/7 contact phone numbers for assistance for urgent and routine care needs. 3. Service will only be billed when office clinical staff spend 20 minutes or more in a month to coordinate care. 4. Only one practitioner may furnish and bill the service in a calendar month. 5. The patient may stop CCM services at any time (effective at the end of the month) by phone call to the office staff.   Patient agreed to services and verbal consent obtained.   Follow up plan:   Lynnae January [employer] Scheduler

## 2019-11-25 ENCOUNTER — Telehealth: Payer: Self-pay | Admitting: Pharmacist

## 2019-11-25 NOTE — Progress Notes (Signed)
Chronic Care Management Pharmacy Assistant   Name: Virginia Edwards  MRN: [id number] DOB: [birth year]  Reason for Encounter: Medication Review-Initial Questions for Pharmacist visit on 11/26/19   Have you seen any other providers since your last visit? No   Any changes in your medications or health? None  Any side effects from any medications? None  Do you have an symptoms or problems not managed by your medications? Patient daughter Virginia Edwards states that her mother has dementia and gets very agitated, and would like to discuss medication that would help with this just for a short time   Any concerns about your health right now? The daughter states that her mom has been having diarrhea often   Has your provider asked that you check blood pressure, blood sugar, or follow special diet at home? The patient is not having them checked    Do you get any type of exercise on a regular basis? The daughter states that the patient gets up and walks around the house  Can you think of a goal you would like to reach for your health? The patient daughter states that she would like to see her mom remember more   Do you have any problems getting your medications? Patient's daughter states that they have no problems with getting medications from pharmacy  Is there anything that you would like to discuss during the appointment? The patient daughter states that her daughter read in a book yesterday about a med that was coming out to help with dementia. She states that it is three medications that will be combined into one pill but that it's not available yet, but that you can discuss with your doctor about using the three medication individually right now. She is not sure about which three medications they are.  Please bring medications and supplements to appointment     PCP : Etta Grandchild, MD  Allergies:   Allergies  Allergen Reactions  . Alendronate Sodium Anaphylaxis    Throat swells  .  Amlodipine Swelling    Leg edema  . Hydrocodone-Acetaminophen Other (See Comments)    hallicinations  . Shellfish Allergy Other (See Comments)    Blister on leg    Medications: Outpatient Encounter Medications as of 11/25/2019  Medication Sig  . acetaminophen (TYLENOL) 500 MG tablet Take 1,000 mg by mouth every 8 (eight) hours.  Marland Kitchen albuterol (VENTOLIN HFA) 108 (90 Base) MCG/ACT inhaler INHALE 2 PUFFS INTO THE LUNGS EVERY 6 HOURS AS NEEDED FOR WHEEZING OR SHORTNESS OF BREATH  . allopurinol (ZYLOPRIM) 100 MG tablet Take 1 tablet (100 mg total) by mouth daily.  Marland Kitchen aspirin 81 MG chewable tablet Chew 81 mg by mouth daily.  Marland Kitchen atorvastatin (LIPITOR) 40 MG tablet TAKE 1 TABLET(40 MG) BY MOUTH DAILY  . bisacodyl (DULCOLAX) 5 MG EC tablet Take 10 mg by mouth daily as needed for moderate constipation.  . carvedilol (COREG) 3.125 MG tablet Take 1 tablet (3.125 mg total) by mouth 2 (two) times daily.  . Cobalamin Combinations (B12 FOLATE) 800-800 MCG CAPS Take 1 tablet by mouth daily.  Marland Kitchen escitalopram (LEXAPRO) 5 MG tablet Take 1 tablet (5 mg total) by mouth daily.  Marland Kitchen losartan (COZAAR) 50 MG tablet TAKE 1 TABLET(50 MG) BY MOUTH DAILY  . morphine 10 MG/5ML solution Take 5 mLs (10 mg total) by mouth every 6 (six) hours as needed for severe pain.  Marland Kitchen torsemide (DEMADEX) 10 MG tablet Take 1 tablet (10 mg total) by mouth daily.  No facility-administered encounter medications on file as of 11/25/2019.    Current Diagnosis: Patient Active Problem List   Diagnosis Date Noted  . Chronic back pain   . E. coli UTI 08/21/2019  . Chronic combined systolic and diastolic congestive heart failure (HCC) 07/08/2019  . Oral candida 04/29/2019  . Gout 01/06/2019  . Pseudogout of knee, right 01/06/2019  . Delirium 01/06/2019  . Dementia with behavioral disturbance (HCC) 01/06/2019  . Chronic idiopathic constipation 11/26/2018  . Routine general medical examination at a health care facility 11/25/2018  . Urinary  frequency 02/03/2018  . Encounter for long-term opiate analgesic use 02/03/2018  . Deficiency anemia 05/14/2016  . OAB (overactive bladder) 05/14/2016  . Insomnia 04/22/2013  . CAD (coronary artery disease) 10/02/2011  . Hyperglycemia 02/22/2011  . Other screening mammogram 08/16/2010  . 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 Essential hypertension, benign 05/17/2010  . Hyperlipidemia with target LDL less than 100 05/17/2010      Follow-Up:  Pharmacist Review   Al Corpus, CPP  Horald Pollen, CPA

## 2019-11-26 ENCOUNTER — Telehealth: Payer: Medicare Other

## 2019-11-26 NOTE — Chronic Care Management (AMB) (Deleted)
Chronic Care Management Pharmacy  Name: Sargun Rummell  MRN: [id number] DOB: [birth year]   Chief Complaint/ HPI  Virginia Edwards,  86 y.o. , female presents for their Initial CCM visit with the clinical pharmacist via telephone due to COVID-19 Pandemic.  PCP : Janith Lima, MD Patient Care Team: Janith Lima, MD as PCP - General (Internal Medicine) Johnathan Hausen, MD (General Surgery) Stanford Breed Denice Bors, MD (Cardiology) Earlie Server, MD (Orthopedic Surgery) Charlton Haws, [facility] as Pharmacist (Pharmacist)  Their chronic conditions include: Hypertension, Hyperlipidemia, Heart Failure, Coronary Artery Disease, Overactive Bladder and Gout, Dementia, Chronic back pain, chronic constipation  Office Visits: 07/08/19 Dr Ronnald Ramp OV: f/u for CHF and anemia. BP high, added torsemide. Remains anemic however iron level is high, dc'd iron supplement. Referred to cardiology  Consult Visit: 09/20/19 [facility] admission for sepsis, AKI, GI hemorrhage 04/2019 [facility] admission: CHF exacerbation. Received PRBC transfusion for anemia.  Allergies  Allergen Reactions  . Alendronate Sodium Anaphylaxis    Throat swells  . Amlodipine Swelling    Leg edema  . Hydrocodone-Acetaminophen Other (See Comments)    hallicinations  . Shellfish Allergy Other (See Comments)    Blister on leg   Medications: Outpatient Encounter Medications as of 11/26/2019  Medication Sig  . acetaminophen (TYLENOL) 500 MG tablet Take 1,000 mg by mouth every 8 (eight) hours.  Marland Kitchen albuterol (VENTOLIN HFA) 108 (90 Base) MCG/ACT inhaler INHALE 2 PUFFS INTO THE LUNGS EVERY 6 HOURS AS NEEDED FOR WHEEZING OR SHORTNESS OF BREATH  . allopurinol (ZYLOPRIM) 100 MG tablet Take 1 tablet (100 mg total) by mouth daily.  Marland Kitchen aspirin 81 MG chewable tablet Chew 81 mg by mouth daily.  Marland Kitchen atorvastatin (LIPITOR) 40 MG tablet TAKE 1 TABLET(40 MG) BY MOUTH DAILY  . bisacodyl (DULCOLAX) 5 MG EC tablet Take 10 mg by mouth daily as needed for moderate  constipation.  . carvedilol (COREG) 3.125 MG tablet Take 1 tablet (3.125 mg total) by mouth 2 (two) times daily.  . Cobalamin Combinations (B12 FOLATE) 800-800 MCG CAPS Take 1 tablet by mouth daily.  Marland Kitchen escitalopram (LEXAPRO) 5 MG tablet Take 1 tablet (5 mg total) by mouth daily.  Marland Kitchen losartan (COZAAR) 50 MG tablet TAKE 1 TABLET(50 MG) BY MOUTH DAILY  . morphine 10 MG/5ML solution Take 5 mLs (10 mg total) by mouth every 6 (six) hours as needed for severe pain.  Marland Kitchen torsemide (DEMADEX) 10 MG tablet Take 1 tablet (10 mg total) by mouth daily.   No facility-administered encounter medications on file as of 11/26/2019.    Wt Readings from Last 3 Encounters:  01/01/19 145 lb 9.6 oz (66 kg)  12/30/18 148 lb 12 oz (67.5 kg)  11/25/18 148 lb 12 oz (67.5 kg)    Current Diagnosis/Assessment:    Goals Addressed   None    Heart Failure   Type: Combined Systolic and Diastolic  Last ejection fraction: 55-60% (09/21/19 @ [place])  BP goal is:  <140/90 BP Readings from Last 3 Encounters:  07/08/19 (!) 142/98  01/22/19 108/78  01/01/19 108/78   Lab Results  Component Value Date   CREATININE 0.63 07/08/2019   BUN 15 07/08/2019   GFR 88.39 07/08/2019   GFRNONAA >60 02/25/2018   GFRAA >60 02/25/2018   NA 142 07/08/2019   K 3.7 07/08/2019   CALCIUM 9.7 07/08/2019   CO2 31 07/08/2019   Patient checks BP at home {CHL HP BP Monitoring [phone]} Patient home BP readings are ranging: ***  Patient has failed  these meds in past: *** Patient is currently {CHL Controlled/Uncontrolled:[phone]} on the following medications:  . Carvedilol 3.125 mg BID . Losartan 50 mg daily . Torsemide 10 mg daily  We discussed {CHL HP Upstream Pharmacy discussion:[phone]}  Plan  Continue {CHL HP Upstream Pharmacy Plans:[phone]}   Hyperlipidemia   LDL goal < 70 Hx CAD  Last lipids Lab Results  Component Value Date   CHOL 257 (H) 11/25/2018   HDL 59.30 11/25/2018   LDLCALC 177 (H)  11/25/2018   LDLDIRECT 151.0 05/14/2016   TRIG 103.0 11/25/2018   CHOLHDL 4 11/25/2018   Hepatic Function Latest Ref Rng & Units 07/08/2019 02/25/2018 07/01/2017  Total Protein 6.0 - 8.3 g/dL 6.3 7.4 6.6  Albumin 3.5 - 5.2 g/dL 3.6 3.9 3.8  AST 0 - 37 U/L 22 25 21   ALT 0 - 35 U/L 17 18 14   Alk Phosphatase 39 - 117 U/L 75 73 78  Total Bilirubin 0.2 - 1.2 mg/dL 0.6 0.9 0.7  Bilirubin, Direct 0.0 - 0.3 mg/dL 0.2 - -     The ASCVD Risk score Mikey Bussing DC Jr., et al., 2013) failed to calculate for the following reasons:   The 2013 ASCVD risk score is only valid for ages 73 to 3   Patient has failed these meds in past: *** Patient is currently {CHL Controlled/Uncontrolled:[phone]} on the following medications:  . Atorvastatin 40 mg daily . Aspirin 81 mg daily  We discussed:  {CHL HP Upstream Pharmacy discussion:[phone]}  Plan  Continue {CHL HP Upstream Pharmacy QMGQQ:[id number]}  Depression / Delirium   Depression screen [facility] 2/9 02/03/2018 05/14/2016 09/22/2014  Decreased Interest 0 0 0  Down, Depressed, Hopeless 1 0 0  PHQ - 2 Score 1 0 0   Patient has failed these meds in past: *** Patient is currently {CHL Controlled/Uncontrolled:[phone]} on the following medications:  . Escitalopram 5 mg daily  We discussed:  ***  Plan  Continue {CHL HP Upstream Pharmacy Plans:[phone]}  Chronic constipation   Patient has failed these meds in past: *** Patient is currently {CHL Controlled/Uncontrolled:[phone]} on the following medications:  . Bisacodyl 5 mg PRN  We discussed:  ***  Plan  Continue {CHL HP Upstream Pharmacy Plans:[phone]}  Anemia   CBC Latest Ref Rng & Units 07/08/2019 11/25/2018 02/25/2018  WBC 4.0 - 10.5 K/uL 6.8 8.7 11.6(H)  Hemoglobin 12.0 - 15.0 g/dL 10.3(L) 11.1(L) 11.4(L)  Hematocrit 36 - 46 % 33.7(L) 34.6(L) 38.6  Platelets 150 - 400 K/uL 193.0 249.0 202   Iron/TIBC/Ferritin/ %Sat    Component Value Date/Time   IRON 169 (H) 07/08/2019 1614    FERRITIN 16.7 07/08/2019 1614   IRONPCTSAT 39.7 07/08/2019 1614   Patient has failed these meds in past: *** Patient is currently {CHL Controlled/Uncontrolled:[phone]} on the following medications:  . B12-Folate 800-800 mcg daily  We discussed:  ***  Plan  Continue {CHL HP Upstream Pharmacy Plans:[phone]}  Gout   Patient has failed these meds in past: *** Patient is currently {CHL Controlled/Uncontrolled:[phone]} on the following medications:  . Allopurinol 100 mg daily  We discussed:  ***  Plan  Continue {CHL HP Upstream Pharmacy Plans:[phone]}  Chronic pain   Patient has failed these meds in past: *** Patient is currently {CHL Controlled/Uncontrolled:[phone]} on the following medications:  . Tylenol 500 mg q8h PRN . Morphine 10 mg/27m q6h PRN  We discussed:  ***  Plan  Continue {CHL HP Upstream Pharmacy Plans:[phone]}  Health Maintenance   Patient is currently {CHL Controlled/Uncontrolled:[phone]} on the following medications:  .  Albuterol HFA prn  We discussed:  ***  Plan  Continue {CHL HP Upstream Pharmacy Plans:[phone]}  Medication Management   Pt uses [facility] for all medications Uses pill box? {Yes or If no, why not?:20788} Pt endorses ***% compliance  We discussed: {Pharmacy options:24294}  Plan  {US Pharmacy VHOY:43142}    Follow up: *** month phone visit  ***

## 2019-12-03 ENCOUNTER — Encounter: Payer: Self-pay | Admitting: Internal Medicine

## 2019-12-06 ENCOUNTER — Other Ambulatory Visit: Payer: Self-pay | Admitting: Internal Medicine

## 2019-12-06 DIAGNOSIS — G8929 Other chronic pain: Secondary | ICD-10-CM

## 2019-12-06 DIAGNOSIS — M549 Dorsalgia, unspecified: Secondary | ICD-10-CM

## 2019-12-06 DIAGNOSIS — Z515 Encounter for palliative care: Secondary | ICD-10-CM

## 2019-12-06 DIAGNOSIS — M170 Bilateral primary osteoarthritis of knee: Secondary | ICD-10-CM

## 2019-12-06 MED ORDER — MORPHINE SULFATE 10 MG/5ML PO SOLN: 10.0000 mg | Freq: Four times a day (QID) | ORAL | 0 refills | Status: DC | PRN

## 2019-12-09 ENCOUNTER — Encounter: Payer: Self-pay | Admitting: Internal Medicine

## 2020-01-09 ENCOUNTER — Other Ambulatory Visit: Payer: Self-pay | Admitting: Internal Medicine

## 2020-01-11 ENCOUNTER — Encounter: Payer: Self-pay | Admitting: Internal Medicine

## 2020-01-13 ENCOUNTER — Encounter: Payer: Self-pay | Admitting: Internal Medicine

## 2020-01-14 ENCOUNTER — Other Ambulatory Visit: Payer: Self-pay | Admitting: Internal Medicine

## 2020-01-14 DIAGNOSIS — M170 Bilateral primary osteoarthritis of knee: Secondary | ICD-10-CM

## 2020-01-14 DIAGNOSIS — Z515 Encounter for palliative care: Secondary | ICD-10-CM

## 2020-01-14 DIAGNOSIS — M549 Dorsalgia, unspecified: Secondary | ICD-10-CM

## 2020-01-16 ENCOUNTER — Encounter: Payer: Self-pay | Admitting: Internal Medicine

## 2020-01-17 ENCOUNTER — Encounter: Payer: Self-pay | Admitting: Internal Medicine

## 2020-01-17 ENCOUNTER — Other Ambulatory Visit: Payer: Self-pay | Admitting: Internal Medicine

## 2020-01-17 DIAGNOSIS — Z515 Encounter for palliative care: Secondary | ICD-10-CM

## 2020-01-17 DIAGNOSIS — M549 Dorsalgia, unspecified: Secondary | ICD-10-CM

## 2020-01-17 DIAGNOSIS — M170 Bilateral primary osteoarthritis of knee: Secondary | ICD-10-CM

## 2020-01-17 DIAGNOSIS — E785 Hyperlipidemia, unspecified: Secondary | ICD-10-CM

## 2020-01-17 MED ORDER — MORPHINE SULFATE 10 MG/5ML PO SOLN: 10.0000 mg | Freq: Four times a day (QID) | ORAL | 0 refills | Status: DC | PRN

## 2020-01-17 MED ORDER — ATORVASTATIN CALCIUM 40 MG PO TABS: ORAL_TABLET | ORAL | 1 refills | Status: AC

## 2020-01-18 ENCOUNTER — Encounter: Payer: Self-pay | Admitting: Internal Medicine

## 2020-02-25 ENCOUNTER — Other Ambulatory Visit: Payer: Self-pay | Admitting: Internal Medicine

## 2020-02-25 ENCOUNTER — Encounter: Payer: Self-pay | Admitting: Internal Medicine

## 2020-02-25 DIAGNOSIS — J410 Simple chronic bronchitis: Secondary | ICD-10-CM

## 2020-02-25 DIAGNOSIS — J40 Bronchitis, not specified as acute or chronic: Secondary | ICD-10-CM

## 2020-02-25 MED ORDER — ALBUTEROL SULFATE HFA 108 (90 BASE) MCG/ACT IN AERS: 1.0000 | INHALATION_SPRAY | Freq: Four times a day (QID) | RESPIRATORY_TRACT | 1 refills | Status: AC | PRN

## 2020-03-03 ENCOUNTER — Telehealth: Payer: Self-pay | Admitting: Internal Medicine

## 2020-03-03 DIAGNOSIS — M170 Bilateral primary osteoarthritis of knee: Secondary | ICD-10-CM

## 2020-03-03 DIAGNOSIS — Z515 Encounter for palliative care: Secondary | ICD-10-CM

## 2020-03-03 DIAGNOSIS — G8929 Other chronic pain: Secondary | ICD-10-CM

## 2020-03-06 MED ORDER — MORPHINE SULFATE 10 MG/5ML PO SOLN: 10.0000 mg | Freq: Four times a day (QID) | ORAL | 0 refills | Status: DC | PRN

## 2020-03-07 NOTE — Telephone Encounter (Signed)
1.Medication Requested: morphine 10 MG/5ML solution    2. Pharmacy (Name, Street, [employer]): [facility] [address] [place], [place] [address]  3. On Med List: yes   4. Last Visit with PCP: 6.17.21  5. Next visit date with PCP: n/a   Patients daughter called and said that the [facility] is on does not have the medication and is requesting that it be sent to the [facility]   Agent: Please be advised that RX refills may take up to 3 business days. We ask that you follow-up with your pharmacy.

## 2020-03-08 MED ORDER — MORPHINE SULFATE 10 MG/5ML PO SOLN: 10.0000 mg | Freq: Four times a day (QID) | ORAL | 0 refills | Status: AC | PRN

## 2020-03-08 NOTE — Telephone Encounter (Signed)
OK. Schedule office visit with Dr. Yetta Barre.  Okay virtual.  Thanks

## 2020-03-08 NOTE — Addendum Note (Signed)
Addended by: Tresa Garter on: 03/08/2020 07:12 AM   Modules accepted: Orders

## 2020-04-04 DIAGNOSIS — G4489 Other headache syndrome: Secondary | ICD-10-CM | POA: Diagnosis not present

## 2020-04-04 DIAGNOSIS — I2510 Atherosclerotic heart disease of native coronary artery without angina pectoris: Secondary | ICD-10-CM | POA: Diagnosis not present

## 2020-04-04 DIAGNOSIS — R0989 Other specified symptoms and signs involving the circulatory and respiratory systems: Secondary | ICD-10-CM | POA: Diagnosis not present

## 2020-04-04 DIAGNOSIS — I517 Cardiomegaly: Secondary | ICD-10-CM | POA: Diagnosis not present

## 2020-04-04 DIAGNOSIS — K8020 Calculus of gallbladder without cholecystitis without obstruction: Secondary | ICD-10-CM | POA: Diagnosis not present

## 2020-04-04 DIAGNOSIS — K921 Melena: Secondary | ICD-10-CM | POA: Diagnosis not present

## 2020-04-04 DIAGNOSIS — D649 Anemia, unspecified: Secondary | ICD-10-CM | POA: Diagnosis not present

## 2020-04-04 DIAGNOSIS — R0789 Other chest pain: Secondary | ICD-10-CM | POA: Diagnosis not present

## 2020-04-04 DIAGNOSIS — N1832 Chronic kidney disease, stage 3b: Secondary | ICD-10-CM | POA: Diagnosis not present

## 2020-04-04 DIAGNOSIS — R791 Abnormal coagulation profile: Secondary | ICD-10-CM | POA: Diagnosis not present

## 2020-04-04 DIAGNOSIS — R1011 Right upper quadrant pain: Secondary | ICD-10-CM | POA: Diagnosis not present

## 2020-04-04 DIAGNOSIS — G309 Alzheimer's disease, unspecified: Secondary | ICD-10-CM | POA: Diagnosis not present

## 2020-04-04 DIAGNOSIS — G8911 Acute pain due to trauma: Secondary | ICD-10-CM | POA: Diagnosis not present

## 2020-04-04 DIAGNOSIS — I5042 Chronic combined systolic (congestive) and diastolic (congestive) heart failure: Secondary | ICD-10-CM | POA: Diagnosis not present

## 2020-04-04 DIAGNOSIS — N2889 Other specified disorders of kidney and ureter: Secondary | ICD-10-CM | POA: Diagnosis not present

## 2020-04-04 DIAGNOSIS — Z79899 Other long term (current) drug therapy: Secondary | ICD-10-CM | POA: Diagnosis not present

## 2020-04-04 DIAGNOSIS — I130 Hypertensive heart and chronic kidney disease with heart failure and stage 1 through stage 4 chronic kidney disease, or unspecified chronic kidney disease: Secondary | ICD-10-CM | POA: Diagnosis not present

## 2020-04-04 DIAGNOSIS — M549 Dorsalgia, unspecified: Secondary | ICD-10-CM | POA: Diagnosis not present

## 2020-04-04 DIAGNOSIS — R079 Chest pain, unspecified: Secondary | ICD-10-CM | POA: Diagnosis not present

## 2020-04-04 DIAGNOSIS — R9431 Abnormal electrocardiogram [ECG] [EKG]: Secondary | ICD-10-CM | POA: Diagnosis not present

## 2020-04-04 DIAGNOSIS — K5900 Constipation, unspecified: Secondary | ICD-10-CM | POA: Diagnosis not present

## 2020-04-04 DIAGNOSIS — D72829 Elevated white blood cell count, unspecified: Secondary | ICD-10-CM | POA: Diagnosis not present

## 2020-04-04 DIAGNOSIS — R296 Repeated falls: Secondary | ICD-10-CM | POA: Diagnosis not present

## 2020-04-05 DIAGNOSIS — K922 Gastrointestinal hemorrhage, unspecified: Secondary | ICD-10-CM | POA: Diagnosis not present

## 2020-04-05 DIAGNOSIS — I2510 Atherosclerotic heart disease of native coronary artery without angina pectoris: Secondary | ICD-10-CM | POA: Diagnosis not present

## 2020-04-05 DIAGNOSIS — G309 Alzheimer's disease, unspecified: Secondary | ICD-10-CM | POA: Diagnosis not present

## 2020-04-05 DIAGNOSIS — I5043 Acute on chronic combined systolic (congestive) and diastolic (congestive) heart failure: Secondary | ICD-10-CM | POA: Diagnosis not present

## 2020-04-05 DIAGNOSIS — Z79899 Other long term (current) drug therapy: Secondary | ICD-10-CM | POA: Diagnosis not present

## 2020-04-05 DIAGNOSIS — I5042 Chronic combined systolic (congestive) and diastolic (congestive) heart failure: Secondary | ICD-10-CM | POA: Diagnosis not present

## 2020-04-05 DIAGNOSIS — D72829 Elevated white blood cell count, unspecified: Secondary | ICD-10-CM | POA: Diagnosis not present

## 2020-04-05 DIAGNOSIS — R791 Abnormal coagulation profile: Secondary | ICD-10-CM | POA: Diagnosis not present

## 2020-04-05 DIAGNOSIS — I10 Essential (primary) hypertension: Secondary | ICD-10-CM | POA: Diagnosis not present

## 2020-04-05 DIAGNOSIS — D649 Anemia, unspecified: Secondary | ICD-10-CM | POA: Diagnosis not present

## 2020-04-05 DIAGNOSIS — I130 Hypertensive heart and chronic kidney disease with heart failure and stage 1 through stage 4 chronic kidney disease, or unspecified chronic kidney disease: Secondary | ICD-10-CM | POA: Diagnosis not present

## 2020-04-05 DIAGNOSIS — N2889 Other specified disorders of kidney and ureter: Secondary | ICD-10-CM | POA: Diagnosis not present

## 2020-04-05 DIAGNOSIS — K5900 Constipation, unspecified: Secondary | ICD-10-CM | POA: Diagnosis not present

## 2020-04-05 DIAGNOSIS — N1832 Chronic kidney disease, stage 3b: Secondary | ICD-10-CM | POA: Diagnosis not present

## 2020-04-05 DIAGNOSIS — K8020 Calculus of gallbladder without cholecystitis without obstruction: Secondary | ICD-10-CM | POA: Diagnosis not present

## 2020-04-06 DIAGNOSIS — D72829 Elevated white blood cell count, unspecified: Secondary | ICD-10-CM | POA: Diagnosis not present

## 2020-04-06 DIAGNOSIS — Z79899 Other long term (current) drug therapy: Secondary | ICD-10-CM | POA: Diagnosis not present

## 2020-04-06 DIAGNOSIS — D649 Anemia, unspecified: Secondary | ICD-10-CM | POA: Diagnosis not present

## 2020-04-06 DIAGNOSIS — I130 Hypertensive heart and chronic kidney disease with heart failure and stage 1 through stage 4 chronic kidney disease, or unspecified chronic kidney disease: Secondary | ICD-10-CM | POA: Diagnosis not present

## 2020-04-06 DIAGNOSIS — R791 Abnormal coagulation profile: Secondary | ICD-10-CM | POA: Diagnosis not present

## 2020-04-06 DIAGNOSIS — I2510 Atherosclerotic heart disease of native coronary artery without angina pectoris: Secondary | ICD-10-CM | POA: Diagnosis not present

## 2020-04-06 DIAGNOSIS — I10 Essential (primary) hypertension: Secondary | ICD-10-CM | POA: Diagnosis not present

## 2020-04-06 DIAGNOSIS — N1832 Chronic kidney disease, stage 3b: Secondary | ICD-10-CM | POA: Diagnosis not present

## 2020-04-06 DIAGNOSIS — G309 Alzheimer's disease, unspecified: Secondary | ICD-10-CM | POA: Diagnosis not present

## 2020-04-06 DIAGNOSIS — K5909 Other constipation: Secondary | ICD-10-CM | POA: Diagnosis not present

## 2020-04-06 DIAGNOSIS — I5042 Chronic combined systolic (congestive) and diastolic (congestive) heart failure: Secondary | ICD-10-CM | POA: Diagnosis not present

## 2020-04-06 DIAGNOSIS — K5900 Constipation, unspecified: Secondary | ICD-10-CM | POA: Diagnosis not present

## 2020-04-07 DIAGNOSIS — K5909 Other constipation: Secondary | ICD-10-CM | POA: Diagnosis not present

## 2020-04-07 DIAGNOSIS — N1832 Chronic kidney disease, stage 3b: Secondary | ICD-10-CM | POA: Diagnosis not present

## 2020-04-07 DIAGNOSIS — Z79899 Other long term (current) drug therapy: Secondary | ICD-10-CM | POA: Diagnosis not present

## 2020-04-07 DIAGNOSIS — D649 Anemia, unspecified: Secondary | ICD-10-CM | POA: Diagnosis not present

## 2020-04-07 DIAGNOSIS — I5042 Chronic combined systolic (congestive) and diastolic (congestive) heart failure: Secondary | ICD-10-CM | POA: Diagnosis not present

## 2020-04-07 DIAGNOSIS — I130 Hypertensive heart and chronic kidney disease with heart failure and stage 1 through stage 4 chronic kidney disease, or unspecified chronic kidney disease: Secondary | ICD-10-CM | POA: Diagnosis not present

## 2020-04-07 DIAGNOSIS — I2510 Atherosclerotic heart disease of native coronary artery without angina pectoris: Secondary | ICD-10-CM | POA: Diagnosis not present

## 2020-04-07 DIAGNOSIS — R791 Abnormal coagulation profile: Secondary | ICD-10-CM | POA: Diagnosis not present

## 2020-04-07 DIAGNOSIS — G309 Alzheimer's disease, unspecified: Secondary | ICD-10-CM | POA: Diagnosis not present

## 2020-04-07 DIAGNOSIS — K5900 Constipation, unspecified: Secondary | ICD-10-CM | POA: Diagnosis not present

## 2020-04-07 DIAGNOSIS — I10 Essential (primary) hypertension: Secondary | ICD-10-CM | POA: Diagnosis not present

## 2020-04-07 DIAGNOSIS — D72829 Elevated white blood cell count, unspecified: Secondary | ICD-10-CM | POA: Diagnosis not present

## 2020-04-08 DIAGNOSIS — D72829 Elevated white blood cell count, unspecified: Secondary | ICD-10-CM | POA: Diagnosis not present

## 2020-04-08 DIAGNOSIS — G309 Alzheimer's disease, unspecified: Secondary | ICD-10-CM | POA: Diagnosis not present

## 2020-04-08 DIAGNOSIS — I130 Hypertensive heart and chronic kidney disease with heart failure and stage 1 through stage 4 chronic kidney disease, or unspecified chronic kidney disease: Secondary | ICD-10-CM | POA: Diagnosis not present

## 2020-04-08 DIAGNOSIS — I5042 Chronic combined systolic (congestive) and diastolic (congestive) heart failure: Secondary | ICD-10-CM | POA: Diagnosis not present

## 2020-04-08 DIAGNOSIS — R791 Abnormal coagulation profile: Secondary | ICD-10-CM | POA: Diagnosis not present

## 2020-04-08 DIAGNOSIS — N1832 Chronic kidney disease, stage 3b: Secondary | ICD-10-CM | POA: Diagnosis not present

## 2020-04-08 DIAGNOSIS — I10 Essential (primary) hypertension: Secondary | ICD-10-CM | POA: Diagnosis not present

## 2020-04-08 DIAGNOSIS — K5909 Other constipation: Secondary | ICD-10-CM | POA: Diagnosis not present

## 2020-04-08 DIAGNOSIS — I2510 Atherosclerotic heart disease of native coronary artery without angina pectoris: Secondary | ICD-10-CM | POA: Diagnosis not present

## 2020-04-08 DIAGNOSIS — D649 Anemia, unspecified: Secondary | ICD-10-CM | POA: Diagnosis not present

## 2020-04-08 DIAGNOSIS — K5900 Constipation, unspecified: Secondary | ICD-10-CM | POA: Diagnosis not present

## 2020-04-08 DIAGNOSIS — Z79899 Other long term (current) drug therapy: Secondary | ICD-10-CM | POA: Diagnosis not present

## 2020-04-09 DIAGNOSIS — K5900 Constipation, unspecified: Secondary | ICD-10-CM | POA: Diagnosis not present

## 2020-04-09 DIAGNOSIS — I10 Essential (primary) hypertension: Secondary | ICD-10-CM | POA: Diagnosis not present

## 2020-04-09 DIAGNOSIS — D649 Anemia, unspecified: Secondary | ICD-10-CM | POA: Diagnosis not present

## 2020-04-09 DIAGNOSIS — N1832 Chronic kidney disease, stage 3b: Secondary | ICD-10-CM | POA: Diagnosis not present

## 2020-04-09 DIAGNOSIS — I2510 Atherosclerotic heart disease of native coronary artery without angina pectoris: Secondary | ICD-10-CM | POA: Diagnosis not present

## 2020-04-09 DIAGNOSIS — Z79899 Other long term (current) drug therapy: Secondary | ICD-10-CM | POA: Diagnosis not present

## 2020-04-09 DIAGNOSIS — I130 Hypertensive heart and chronic kidney disease with heart failure and stage 1 through stage 4 chronic kidney disease, or unspecified chronic kidney disease: Secondary | ICD-10-CM | POA: Diagnosis not present

## 2020-04-09 DIAGNOSIS — G309 Alzheimer's disease, unspecified: Secondary | ICD-10-CM | POA: Diagnosis not present

## 2020-04-09 DIAGNOSIS — R791 Abnormal coagulation profile: Secondary | ICD-10-CM | POA: Diagnosis not present

## 2020-04-09 DIAGNOSIS — I5042 Chronic combined systolic (congestive) and diastolic (congestive) heart failure: Secondary | ICD-10-CM | POA: Diagnosis not present

## 2020-04-09 DIAGNOSIS — D72829 Elevated white blood cell count, unspecified: Secondary | ICD-10-CM | POA: Diagnosis not present

## 2020-04-09 DIAGNOSIS — K5909 Other constipation: Secondary | ICD-10-CM | POA: Diagnosis not present

## 2020-04-10 DIAGNOSIS — Z515 Encounter for palliative care: Secondary | ICD-10-CM | POA: Diagnosis not present

## 2020-04-10 DIAGNOSIS — I10 Essential (primary) hypertension: Secondary | ICD-10-CM | POA: Diagnosis not present

## 2020-04-10 DIAGNOSIS — G309 Alzheimer's disease, unspecified: Secondary | ICD-10-CM | POA: Diagnosis not present

## 2020-04-10 DIAGNOSIS — K5909 Other constipation: Secondary | ICD-10-CM | POA: Diagnosis not present

## 2020-04-10 DIAGNOSIS — Z79899 Other long term (current) drug therapy: Secondary | ICD-10-CM | POA: Diagnosis not present

## 2020-04-10 DIAGNOSIS — Z7189 Other specified counseling: Secondary | ICD-10-CM | POA: Diagnosis not present

## 2020-04-10 DIAGNOSIS — I2510 Atherosclerotic heart disease of native coronary artery without angina pectoris: Secondary | ICD-10-CM | POA: Diagnosis not present

## 2020-04-10 DIAGNOSIS — D72829 Elevated white blood cell count, unspecified: Secondary | ICD-10-CM | POA: Diagnosis not present

## 2020-04-10 DIAGNOSIS — I5042 Chronic combined systolic (congestive) and diastolic (congestive) heart failure: Secondary | ICD-10-CM | POA: Diagnosis not present

## 2020-04-10 DIAGNOSIS — R791 Abnormal coagulation profile: Secondary | ICD-10-CM | POA: Diagnosis not present

## 2020-04-10 DIAGNOSIS — D649 Anemia, unspecified: Secondary | ICD-10-CM | POA: Diagnosis not present

## 2020-04-10 DIAGNOSIS — K5900 Constipation, unspecified: Secondary | ICD-10-CM | POA: Diagnosis not present

## 2020-04-10 DIAGNOSIS — N1832 Chronic kidney disease, stage 3b: Secondary | ICD-10-CM | POA: Diagnosis not present

## 2020-04-10 DIAGNOSIS — I130 Hypertensive heart and chronic kidney disease with heart failure and stage 1 through stage 4 chronic kidney disease, or unspecified chronic kidney disease: Secondary | ICD-10-CM | POA: Diagnosis not present

## 2020-04-11 DIAGNOSIS — I5042 Chronic combined systolic (congestive) and diastolic (congestive) heart failure: Secondary | ICD-10-CM | POA: Diagnosis not present

## 2020-04-11 DIAGNOSIS — R531 Weakness: Secondary | ICD-10-CM | POA: Diagnosis not present

## 2020-04-11 DIAGNOSIS — I130 Hypertensive heart and chronic kidney disease with heart failure and stage 1 through stage 4 chronic kidney disease, or unspecified chronic kidney disease: Secondary | ICD-10-CM | POA: Diagnosis not present

## 2020-04-11 DIAGNOSIS — G309 Alzheimer's disease, unspecified: Secondary | ICD-10-CM | POA: Diagnosis not present

## 2020-04-11 DIAGNOSIS — Z7189 Other specified counseling: Secondary | ICD-10-CM | POA: Diagnosis not present

## 2020-04-11 DIAGNOSIS — G301 Alzheimer's disease with late onset: Secondary | ICD-10-CM | POA: Diagnosis not present

## 2020-04-11 DIAGNOSIS — D72829 Elevated white blood cell count, unspecified: Secondary | ICD-10-CM | POA: Diagnosis not present

## 2020-04-11 DIAGNOSIS — N1832 Chronic kidney disease, stage 3b: Secondary | ICD-10-CM | POA: Diagnosis not present

## 2020-04-11 DIAGNOSIS — K5900 Constipation, unspecified: Secondary | ICD-10-CM | POA: Diagnosis not present

## 2020-04-11 DIAGNOSIS — I10 Essential (primary) hypertension: Secondary | ICD-10-CM | POA: Diagnosis not present

## 2020-04-11 DIAGNOSIS — I5043 Acute on chronic combined systolic (congestive) and diastolic (congestive) heart failure: Secondary | ICD-10-CM | POA: Diagnosis not present

## 2020-04-11 DIAGNOSIS — R791 Abnormal coagulation profile: Secondary | ICD-10-CM | POA: Diagnosis not present

## 2020-04-11 DIAGNOSIS — I2510 Atherosclerotic heart disease of native coronary artery without angina pectoris: Secondary | ICD-10-CM | POA: Diagnosis not present

## 2020-04-11 DIAGNOSIS — Z515 Encounter for palliative care: Secondary | ICD-10-CM | POA: Diagnosis not present

## 2020-04-11 DIAGNOSIS — Z79899 Other long term (current) drug therapy: Secondary | ICD-10-CM | POA: Diagnosis not present

## 2020-04-11 DIAGNOSIS — D649 Anemia, unspecified: Secondary | ICD-10-CM | POA: Diagnosis not present

## 2020-04-12 DIAGNOSIS — D649 Anemia, unspecified: Secondary | ICD-10-CM | POA: Diagnosis not present

## 2020-04-12 DIAGNOSIS — I959 Hypotension, unspecified: Secondary | ICD-10-CM | POA: Diagnosis not present

## 2020-04-12 DIAGNOSIS — Z79899 Other long term (current) drug therapy: Secondary | ICD-10-CM | POA: Diagnosis not present

## 2020-04-12 DIAGNOSIS — I130 Hypertensive heart and chronic kidney disease with heart failure and stage 1 through stage 4 chronic kidney disease, or unspecified chronic kidney disease: Secondary | ICD-10-CM | POA: Diagnosis not present

## 2020-04-12 DIAGNOSIS — G301 Alzheimer's disease with late onset: Secondary | ICD-10-CM | POA: Diagnosis not present

## 2020-04-12 DIAGNOSIS — N1832 Chronic kidney disease, stage 3b: Secondary | ICD-10-CM | POA: Diagnosis not present

## 2020-04-12 DIAGNOSIS — K5900 Constipation, unspecified: Secondary | ICD-10-CM | POA: Diagnosis not present

## 2020-04-12 DIAGNOSIS — I5042 Chronic combined systolic (congestive) and diastolic (congestive) heart failure: Secondary | ICD-10-CM | POA: Diagnosis not present

## 2020-04-12 DIAGNOSIS — I10 Essential (primary) hypertension: Secondary | ICD-10-CM | POA: Diagnosis not present

## 2020-04-12 DIAGNOSIS — D72829 Elevated white blood cell count, unspecified: Secondary | ICD-10-CM | POA: Diagnosis not present

## 2020-04-12 DIAGNOSIS — Z743 Need for continuous supervision: Secondary | ICD-10-CM | POA: Diagnosis not present

## 2020-04-12 DIAGNOSIS — R279 Unspecified lack of coordination: Secondary | ICD-10-CM | POA: Diagnosis not present

## 2020-04-12 DIAGNOSIS — G309 Alzheimer's disease, unspecified: Secondary | ICD-10-CM | POA: Diagnosis not present

## 2020-04-12 DIAGNOSIS — I5043 Acute on chronic combined systolic (congestive) and diastolic (congestive) heart failure: Secondary | ICD-10-CM | POA: Diagnosis not present

## 2020-04-12 DIAGNOSIS — R1310 Dysphagia, unspecified: Secondary | ICD-10-CM | POA: Diagnosis not present

## 2020-04-12 DIAGNOSIS — R531 Weakness: Secondary | ICD-10-CM | POA: Diagnosis not present

## 2020-04-12 DIAGNOSIS — I2510 Atherosclerotic heart disease of native coronary artery without angina pectoris: Secondary | ICD-10-CM | POA: Diagnosis not present

## 2020-04-12 DIAGNOSIS — Z515 Encounter for palliative care: Secondary | ICD-10-CM | POA: Diagnosis not present

## 2020-04-12 DIAGNOSIS — R791 Abnormal coagulation profile: Secondary | ICD-10-CM | POA: Diagnosis not present

## 2022-10-07 ENCOUNTER — Other Ambulatory Visit (HOSPITAL_COMMUNITY): Payer: Self-pay

## 2023-02-03 ENCOUNTER — Other Ambulatory Visit (HOSPITAL_COMMUNITY): Payer: Self-pay
